# Patient Record
Sex: Male | Born: 1955 | Race: Black or African American | Hispanic: No | Marital: Single | Smoking: Former smoker
Health system: Southern US, Community
[De-identification: ages and names within clinical notes are randomized; demographics above are authoritative.]

## PROBLEM LIST (undated history)

## (undated) DIAGNOSIS — J439 Emphysema, unspecified: Secondary | ICD-10-CM

## (undated) DIAGNOSIS — I10 Essential (primary) hypertension: Secondary | ICD-10-CM

## (undated) DIAGNOSIS — J45909 Unspecified asthma, uncomplicated: Secondary | ICD-10-CM

## (undated) HISTORY — PX: HERNIA REPAIR: SHX51

---

## 2016-08-03 ENCOUNTER — Emergency Department (HOSPITAL_COMMUNITY): Payer: Medicaid Other

## 2016-08-03 ENCOUNTER — Encounter (HOSPITAL_COMMUNITY): Payer: Self-pay | Admitting: *Deleted

## 2016-08-03 ENCOUNTER — Emergency Department (HOSPITAL_COMMUNITY)
Admission: EM | Admit: 2016-08-03 | Discharge: 2016-08-03 | Disposition: A | Discharge status: Home / Self Care | Payer: Medicaid Other | Attending: Emergency Medicine | Admitting: Emergency Medicine

## 2016-08-03 DIAGNOSIS — Z760 Encounter for issue of repeat prescription: Secondary | ICD-10-CM | POA: Insufficient documentation

## 2016-08-03 DIAGNOSIS — Z87891 Personal history of nicotine dependence: Secondary | ICD-10-CM | POA: Diagnosis not present

## 2016-08-03 DIAGNOSIS — J45909 Unspecified asthma, uncomplicated: Secondary | ICD-10-CM | POA: Insufficient documentation

## 2016-08-03 DIAGNOSIS — R0602 Shortness of breath: Secondary | ICD-10-CM | POA: Insufficient documentation

## 2016-08-03 DIAGNOSIS — Z79899 Other long term (current) drug therapy: Secondary | ICD-10-CM | POA: Diagnosis not present

## 2016-08-03 DIAGNOSIS — I10 Essential (primary) hypertension: Secondary | ICD-10-CM | POA: Diagnosis not present

## 2016-08-03 HISTORY — DX: Essential (primary) hypertension: I10

## 2016-08-03 HISTORY — DX: Unspecified asthma, uncomplicated: J45.909

## 2016-08-03 HISTORY — DX: Emphysema, unspecified: J43.9

## 2016-08-03 LAB — CBC WITH DIFFERENTIAL/PLATELET
Basophils Absolute: 0 10*3/uL (ref 0.0–0.1)
Basophils Relative: 0 %
Eosinophils Absolute: 0.1 10*3/uL (ref 0.0–0.7)
Eosinophils Relative: 1 %
HCT: 41.8 % (ref 39.0–52.0)
Hemoglobin: 13.6 g/dL (ref 13.0–17.0)
Lymphocytes Relative: 30 %
Lymphs Abs: 2.2 10*3/uL (ref 0.7–4.0)
MCH: 23.3 pg — ABNORMAL LOW (ref 26.0–34.0)
MCHC: 32.5 g/dL (ref 30.0–36.0)
MCV: 71.7 fL — ABNORMAL LOW (ref 78.0–100.0)
Monocytes Absolute: 0.7 10*3/uL (ref 0.1–1.0)
Monocytes Relative: 9 %
Neutro Abs: 4.4 10*3/uL (ref 1.7–7.7)
Neutrophils Relative %: 60 %
Platelets: 295 10*3/uL (ref 150–400)
RBC: 5.83 MIL/uL — ABNORMAL HIGH (ref 4.22–5.81)
RDW: 15.3 % (ref 11.5–15.5)
WBC: 7.3 10*3/uL (ref 4.0–10.5)

## 2016-08-03 LAB — BASIC METABOLIC PANEL
ANION GAP: 10 (ref 5–15)
BUN: 8 mg/dL (ref 6–20)
CO2: 27 mmol/L (ref 22–32)
Calcium: 9.7 mg/dL (ref 8.9–10.3)
Chloride: 95 mmol/L — ABNORMAL LOW (ref 101–111)
Creatinine, Ser: 0.66 mg/dL (ref 0.61–1.24)
GFR calc Af Amer: >60 mL/min (ref >60)
GFR calc non Af Amer: >60 mL/min (ref >60)
GLUCOSE: 92 mg/dL (ref 65–99)
POTASSIUM: 3.9 mmol/L (ref 3.5–5.1)
Sodium: 132 mmol/L — ABNORMAL LOW (ref 135–145)

## 2016-08-03 MED ORDER — AMLODIPINE BESYLATE 5 MG PO TABS
5.0000 mg | ORAL_TABLET | Freq: Once | ORAL | Status: AC
  Administered 2016-08-03: 5 mg via ORAL
  Filled 2016-08-03: qty 1

## 2016-08-03 MED ORDER — FLUTICASONE-SALMETEROL 250-50 MCG/DOSE IN AEPB: 1.0000 | INHALATION_SPRAY | Freq: Two times a day (BID) | RESPIRATORY_TRACT | 0 refills | Status: DC

## 2016-08-03 MED ORDER — AMLODIPINE BESYLATE 5 MG PO TABS: 5.0000 mg | ORAL_TABLET | Freq: Every day | ORAL | 0 refills | Status: DC

## 2016-08-03 MED ORDER — LISINOPRIL 10 MG PO TABS
10.0000 mg | ORAL_TABLET | Freq: Once | ORAL | Status: AC
  Administered 2016-08-03: 10 mg via ORAL
  Filled 2016-08-03: qty 1

## 2016-08-03 MED ORDER — IPRATROPIUM BROMIDE 0.02 % IN SOLN
0.5000 mg | RESPIRATORY_TRACT | 12 refills | Status: DC | PRN
Start: 2016-08-03 — End: 2016-09-26

## 2016-08-03 MED ORDER — ALBUTEROL SULFATE HFA 108 (90 BASE) MCG/ACT IN AERS
1.0000 | INHALATION_SPRAY | Freq: Once | RESPIRATORY_TRACT | Status: AC
  Administered 2016-08-03: 1 via RESPIRATORY_TRACT
  Filled 2016-08-03: qty 6.7

## 2016-08-03 MED ORDER — IPRATROPIUM-ALBUTEROL 0.5-2.5 (3) MG/3ML IN SOLN
3.0000 mL | Freq: Once | RESPIRATORY_TRACT | Status: AC
  Administered 2016-08-03: 3 mL via RESPIRATORY_TRACT
  Filled 2016-08-03: qty 3

## 2016-08-03 MED ORDER — LISINOPRIL 10 MG PO TABS: 10.0000 mg | ORAL_TABLET | Freq: Every day | ORAL | 0 refills | Status: DC

## 2016-08-03 NOTE — ED Triage Notes (Signed)
Pt was released from prison with a 30 day supply of his medication, which he ran out of a week ago. Pt has been unable to go to social services to    Pt is out of his lisinopril , norvasc , ipratroprium bromide nebulizer solution, albuterol inhaler, airduo inhaler.

## 2016-08-03 NOTE — ED Provider Notes (Signed)
[facility]-EMERGENCY DEPT Provider Note    By signing my name below, I, Matthew Morrison, attest that this documentation has been prepared under the direction and in the presence of [facility], PA-C. Electronically Signed: Earmon Morrison, ED Scribe. 08/03/16. 3:20 PM.    History   Chief Complaint Chief Complaint  Patient presents with  . Medication Refill   The history is provided by the patient and medical records. No language interpreter was used.    Matthew Morrison is a 61 y.o. male with PMHx of asthma, COPD and HTN who presents to the Emergency Department needing a refill on his daily maintenance medications. He reports associated worsening SOB for the past four days and improving productive cough of yellow-white phlegm. He also reports wheezing and sinus pain/pressure due to allergies. He states he was recently released from prison with a 30 day supply of his medications but has not been able to follow up for assistance from social services. He has been taking the medications as directed until he ran out 7 days ago. Exertion increases the SOB. Sometimes hot showers and caffeine help alleviate the symptoms mildly. He denies fever, chills, CP, nausea, vomiting, neck or back pain, numbness, tingling or weakness of any extremity.   Past Medical History:  Diagnosis Date  . Asthma   . Emphysema lung (HCC)   . Hypertension     There are no active problems to display for this patient.   Past Surgical History:  Procedure Laterality Date  . HERNIA REPAIR       Home Medications    Prior to Admission medications   Medication Sig Start Date End Date Taking? Authorizing Provider  albuterol (PROVENTIL) (2.5 MG/3ML) 0.083% nebulizer solution Take 2.5 mg by nebulization every 6 (six) hours as needed for wheezing or shortness of breath.   Yes Historical Provider, MD  ipratropium-albuterol (DUONEB) 0.5-2.5 (3) MG/3ML SOLN Take 3 mLs by nebulization.   Yes Historical Provider, MD  montelukast  (SINGULAIR) 10 MG tablet Take 10 mg by mouth at bedtime.   Yes Historical Provider, MD  amLODipine (NORVASC) 5 MG tablet Take 1 tablet (5 mg total) by mouth daily. 08/03/16 08/17/16  [name] [name], PA-C  Fluticasone-Salmeterol (ADVAIR DISKUS) 250-50 MCG/DOSE AEPB Inhale 1 puff into the lungs 2 (two) times daily. 08/03/16   [name] A [name], PA-C  ipratropium (ATROVENT) 0.02 % nebulizer solution Take 2.5 mLs (0.5 mg total) by nebulization every 4 (four) hours as needed for wheezing or shortness of breath. 08/03/16   [name] A [name], PA-C  lisinopril (PRINIVIL,ZESTRIL) 10 MG tablet Take 1 tablet (10 mg total) by mouth daily. 08/03/16   [name], PA-C    Family History No family history on file.  Social History Social History  Substance Use Topics  . Smoking status: Former Games developer  . Smokeless tobacco: Never Used  . Alcohol use Yes     Allergies   Norflex [orphenadrine]   Review of Systems Review of Systems  Constitutional: Negative for chills and fever.  HENT: Positive for sinus pain and sinus pressure.   Respiratory: Positive for cough, shortness of breath and wheezing.   Cardiovascular: Negative for chest pain.  Gastrointestinal: Negative for nausea and vomiting.  Neurological: Negative for weakness and numbness.     Physical Exam Updated Vital Signs BP (!) 138/103   Pulse (!) 114   Temp 98.1 F (36.7 C)   Resp 16   SpO2 100%   Physical Exam  Constitutional: He is oriented to person,  place, and time. He appears well-developed and well-nourished. No distress.  HENT:  Head: Normocephalic and atraumatic.  Right Ear: Tympanic membrane and external ear normal.  Left Ear: Tympanic membrane and external ear normal.  Nose: Nose normal.  Mouth/Throat: Uvula is midline, oropharynx is clear and moist and mucous membranes are normal. No oropharyngeal exudate. No tonsillar exudate.  No TTP of maxillary or frontal sinuses. Nasal septum midline with pink mucosa and normal drainage. TMs normal  bilaterally. Posterior oropharynx without erythema, exudate, or uvular deviation   Eyes: Conjunctivae and EOM are normal. Pupils are equal, round, and reactive to light. Right eye exhibits no discharge. Left eye exhibits no discharge.  Neck: Normal range of motion. Neck supple. No JVD present. No tracheal deviation present.  Cardiovascular: Regular rhythm and normal heart sounds.  Tachycardia present.   Tachycardic, 2+ radial and DP/PT pulses bl, negative Homan's bl   Pulmonary/Chest: Effort normal. No respiratory distress. He has wheezes. He has no rales. He exhibits no tenderness.  Diffuse expiratory wheezing  Abdominal: Soft. Bowel sounds are normal. He exhibits no distension. There is no tenderness.  Musculoskeletal: Normal range of motion.  Lymphadenopathy:    He has no cervical adenopathy.  Neurological: He is alert and oriented to person, place, and time.  Skin: Skin is warm and dry. Capillary refill takes less than 2 seconds. He is not diaphoretic.  Psychiatric: He has a normal mood and affect. His behavior is normal.  Nursing note and vitals reviewed.    ED Treatments / Results  DIAGNOSTIC STUDIES: Oxygen Saturation is 100% on RA, normal by my interpretation.   COORDINATION OF CARE: 1:51 PM- Encouraged pt to establish care with PCP and social services. Will order CXR, nebulizer treatment and MDI prior to discharge. Will refill medications. Pt verbalizes understanding and agrees to plan.  Medications  lisinopril (PRINIVIL,ZESTRIL) tablet 10 mg (10 mg Oral Given 08/03/16 1417)  amLODipine (NORVASC) tablet 5 mg (5 mg Oral Given 08/03/16 1417)  ipratropium-albuterol (DUONEB) 0.5-2.5 (3) MG/3ML nebulizer solution 3 mL (3 mLs Nebulization Given 08/03/16 1417)  albuterol (PROVENTIL HFA;VENTOLIN HFA) 108 (90 Base) MCG/ACT inhaler 1 puff (1 puff Inhalation Given 08/03/16 1417)    Labs (all labs ordered are listed, but only abnormal results are displayed) Labs Reviewed  CBC WITH  DIFFERENTIAL/PLATELET - Abnormal; Notable for the following:       Result Value   RBC 5.83 (*)    MCV 71.7 (*)    MCH 23.3 (*)    All other components within normal limits  BASIC METABOLIC PANEL - Abnormal; Notable for the following:    Sodium 132 (*)    Chloride 95 (*)    All other components within normal limits    EKG  EKG Interpretation None       Radiology Dg Chest 2 View  Result Date: 08/03/2016 CLINICAL DATA:  Hx of copd, hypertension, arthritis of the spine. Ex-smoker EXAM: CHEST  2 VIEW COMPARISON:  None FINDINGS: The lungs are hyperinflated. There is perihilar peribronchial thickening. A focal irregular opacity is identified in the right upper lobe raising the question of a pulmonary nodule. There are no focal consolidations. No pleural effusions or pulmonary edema. Visualized osseous structures have a normal appearance. IMPRESSION: 1. Hyperinflation and bronchitic changes. 2. Question of right upper lobe nodule warrants further evaluation. CT of the chest is recommended. These results will be called to the ordering clinician or representative by the Radiologist Assistant, and communication documented in the PACS or zVision  Dashboard. Electronically Signed   By: Norva Pavlov M.D.   On: 08/03/2016 14:18    Procedures Procedures (including critical care time)  Medications Ordered in ED Medications  lisinopril (PRINIVIL,ZESTRIL) tablet 10 mg (10 mg Oral Given 08/03/16 1417)  amLODipine (NORVASC) tablet 5 mg (5 mg Oral Given 08/03/16 1417)  ipratropium-albuterol (DUONEB) 0.5-2.5 (3) MG/3ML nebulizer solution 3 mL (3 mLs Nebulization Given 08/03/16 1417)  albuterol (PROVENTIL HFA;VENTOLIN HFA) 108 (90 Base) MCG/ACT inhaler 1 puff (1 puff Inhalation Given 08/03/16 1417)     Initial Impression / Assessment and Plan / ED Course  I have reviewed the triage vital signs and the nursing notes.  Pertinent labs & imaging results that were available during my care of the patient were  reviewed by me and considered in my medical decision making (see chart for details).     Pt is here for refill of Lisinopril, Norvasc, Atrovent nebulizer solution, albuterol MDI and Airduo inhaler. With 4 day history of cough and shortness of breath, will obtain chest x-ray and give DuoNeb as well as obtained baseline labs. Chest x-ray negative for pneumonia, but with hyperinflation and bronchitis changes and a right upper lobe nodule. Patient aware of nodule, and states it has been stable. Emergent CT not required at this time. Patient states he feels much better after getting duoneb, with improvement of wheezing  On re-evaluation. No concerning labs. Medications are not a controlled substance. Will refill medication here. Discussed need to follow up with PCP in 2-3 days, and given information for [facility] and wellness.discussed strict ED return precautions. Pt verbalized understanding of and agreement with plan and is safe for discharge home at this time.   Final Clinical Impressions(s) / ED Diagnoses   Final diagnoses:  SOB (shortness of breath)  Medication refill    New Prescriptions Discharge Medication List as of 08/03/2016  4:22 PM    START taking these medications   Details  Fluticasone-Salmeterol (ADVAIR DISKUS) 250-50 MCG/DOSE AEPB Inhale 1 puff into the lungs 2 (two) times daily., Starting Wed 08/03/2016, Print        I personally performed the services described in this documentation, which was scribed in my presence. The recorded information has been reviewed and is accurate.     [name], PA-C 08/03/16 2030    Doug Sou, MD 08/03/16 2358

## 2016-08-23 ENCOUNTER — Emergency Department (HOSPITAL_COMMUNITY): Payer: Medicaid Other

## 2016-08-23 ENCOUNTER — Encounter (HOSPITAL_COMMUNITY): Payer: Self-pay | Admitting: Emergency Medicine

## 2016-08-23 ENCOUNTER — Emergency Department (HOSPITAL_COMMUNITY)
Admission: EM | Admit: 2016-08-23 | Discharge: 2016-08-24 | Disposition: A | Discharge status: Home / Self Care | Payer: Medicaid Other | Attending: Emergency Medicine | Admitting: Emergency Medicine

## 2016-08-23 DIAGNOSIS — Z87891 Personal history of nicotine dependence: Secondary | ICD-10-CM | POA: Insufficient documentation

## 2016-08-23 DIAGNOSIS — J45909 Unspecified asthma, uncomplicated: Secondary | ICD-10-CM | POA: Diagnosis not present

## 2016-08-23 DIAGNOSIS — J441 Chronic obstructive pulmonary disease with (acute) exacerbation: Secondary | ICD-10-CM | POA: Diagnosis not present

## 2016-08-23 DIAGNOSIS — R0602 Shortness of breath: Secondary | ICD-10-CM | POA: Diagnosis present

## 2016-08-23 DIAGNOSIS — Z79899 Other long term (current) drug therapy: Secondary | ICD-10-CM | POA: Insufficient documentation

## 2016-08-23 DIAGNOSIS — I10 Essential (primary) hypertension: Secondary | ICD-10-CM | POA: Insufficient documentation

## 2016-08-23 LAB — CBC WITH DIFFERENTIAL/PLATELET
Basophils Absolute: 0 10*3/uL (ref 0.0–0.1)
Basophils Relative: 0 %
EOS ABS: 0.1 10*3/uL (ref 0.0–0.7)
EOS PCT: 1 %
HCT: 39.5 % (ref 39.0–52.0)
HEMOGLOBIN: 12.5 g/dL — AB (ref 13.0–17.0)
LYMPHS ABS: 1.6 10*3/uL (ref 0.7–4.0)
Lymphocytes Relative: 16 %
MCH: 22.9 pg — AB (ref 26.0–34.0)
MCHC: 31.6 g/dL (ref 30.0–36.0)
MCV: 72.3 fL — ABNORMAL LOW (ref 78.0–100.0)
MONOS PCT: 2 %
Monocytes Absolute: 0.2 10*3/uL (ref 0.1–1.0)
Neutro Abs: 8 10*3/uL — ABNORMAL HIGH (ref 1.7–7.7)
Neutrophils Relative %: 81 %
Platelets: 253 10*3/uL (ref 150–400)
RBC: 5.46 MIL/uL (ref 4.22–5.81)
RDW: 15.6 % — ABNORMAL HIGH (ref 11.5–15.5)
WBC: 9.9 10*3/uL (ref 4.0–10.5)

## 2016-08-23 LAB — I-STAT TROPONIN, ED: TROPONIN I, POC: 0 ng/mL (ref 0.00–0.08)

## 2016-08-23 MED ORDER — SODIUM CHLORIDE 0.9 % IV BOLUS (SEPSIS)
1000.0000 mL | Freq: Once | INTRAVENOUS | Status: AC
  Administered 2016-08-23: 1000 mL via INTRAVENOUS

## 2016-08-23 MED ORDER — IPRATROPIUM-ALBUTEROL 0.5-2.5 (3) MG/3ML IN SOLN
3.0000 mL | Freq: Once | RESPIRATORY_TRACT | Status: AC
  Administered 2016-08-23: 3 mL via RESPIRATORY_TRACT
  Filled 2016-08-23: qty 3

## 2016-08-23 NOTE — ED Provider Notes (Signed)
[facility]-EMERGENCY DEPT Provider Note   CSN: [id number] Arrival date & time: 08/23/16  2214     History   Chief Complaint Chief Complaint  Patient presents with  . Shortness of Breath    HPI  Matthew Morrison is a 61 y.o. male past medical history of COPD brought in by EMS he reports 3 days of shortness of breath, cough. Patient reports over the last 3 days he has been experiencing shortness of breath. On EMS arrival he had some noticeable wheezing. He was given findings of albuterol and 2 times DuoNeb treatments. He was also given 125 mg a metal en route. He reports some improvement in symptoms after initial treatment by EMS. Patient reports that he was recently released from prison and has not been able to get his medications for his COPD due to financial constraints. He reports that he was seen here 2 weeks ago for some shortness of breath and was prescribed his inhalers. He has been using them and hasn't had some improvement in symptoms. He reports that 3 days ago he ran out of his inhalers he began having worsening shortness of breath. He also reports a nonproductive cough. He reports some chest pain that is secondary to the coughing. He describes pain as a "sore/ache" and states that it is only happening is coughing. He denies any recent illness or fever. He denies any chest pain, abdominal pain, nausea/vomiting, dysuria, hematuria.  The history is provided by the patient.    Past Medical History:  Diagnosis Date  . Asthma   . Emphysema lung (HCC)   . Hypertension     There are no active problems to display for this patient.   Past Surgical History:  Procedure Laterality Date  . HERNIA REPAIR         Home Medications    Prior to Admission medications   Medication Sig Start Date End Date Taking? Authorizing Provider  albuterol (PROVENTIL HFA;VENTOLIN HFA) 108 (90 Base) MCG/ACT inhaler Inhale 2 puffs into the lungs every 4 (four) hours as needed for wheezing or shortness of  breath. 08/24/16   Graciella Freer A, PA-C  albuterol (PROVENTIL) (2.5 MG/3ML) 0.083% nebulizer solution Take 3 mLs (2.5 mg total) by nebulization every 6 (six) hours as needed for wheezing or shortness of breath. 08/24/16   Graciella Freer A, PA-C  amLODipine (NORVASC) 5 MG tablet Take 1 tablet (5 mg total) by mouth daily. 08/03/16 08/17/16  Michela Pitcher A, PA-C  Fluticasone-Salmeterol (ADVAIR DISKUS) 250-50 MCG/DOSE AEPB Inhale 1 puff into the lungs 2 (two) times daily. 08/03/16   Fawze, Mina A, PA-C  ipratropium (ATROVENT) 0.02 % nebulizer solution Take 2.5 mLs (0.5 mg total) by nebulization every 4 (four) hours as needed for wheezing or shortness of breath. 08/03/16   Fawze, Mina A, PA-C  ipratropium-albuterol (DUONEB) 0.5-2.5 (3) MG/3ML SOLN Take 3 mLs by nebulization.    [provider]  lisinopril (PRINIVIL,ZESTRIL) 10 MG tablet Take 1 tablet (10 mg total) by mouth daily. 08/03/16   Fawze, Mina A, PA-C  montelukast (SINGULAIR) 10 MG tablet Take 10 mg by mouth at bedtime.    [provider]  predniSONE (DELTASONE) 10 MG tablet Take 2 tablets (20 mg total) by mouth daily. 08/24/16   Maxwell Caul, PA-C    Family History History reviewed. No pertinent family history.  Social History Social History  Substance Use Topics  . Smoking status: Former Games developer  . Smokeless tobacco: Never Used  . Alcohol use Yes  Allergies   Norflex [orphenadrine]   Review of Systems Review of Systems  Constitutional: Negative for fever.  HENT: Negative for congestion.   Respiratory: Positive for cough and shortness of breath.   Cardiovascular: Positive for chest pain. Negative for leg swelling.  Gastrointestinal: Negative for abdominal pain, nausea and vomiting.  Genitourinary: Negative for dysuria.  Neurological: Negative for headaches.     Physical Exam Updated Vital Signs BP 106/88 (BP Location: Left Arm)   Pulse (!) 110   Temp 97.9 F (36.6 C) (Oral)   Resp 18   SpO2 94%    Physical Exam  Constitutional: He appears well-developed and well-nourished.  Sitting comfortably on examination table.   HENT:  Head: Normocephalic and atraumatic.  Eyes: Conjunctivae and EOM are normal. Right eye exhibits no discharge. Left eye exhibits no discharge. No scleral icterus.  Cardiovascular: Regular rhythm.  Tachycardia present.   Pulses:      Radial pulses are 2+ on the right side, and 2+ on the left side.       Dorsalis pedis pulses are 2+ on the right side, and 2+ on the left side.  No bilateral lower extremity edema.  Pulmonary/Chest: Effort normal. He has wheezes. He has no rhonchi. He has no rales.  Diffuse wheezing throughout all lung fields. No evidence of respiratory distress. Able to speak in full sentences without difficulty.  Musculoskeletal: He exhibits no deformity.  Neurological: He is alert.  Skin: Skin is warm and dry.  Psychiatric: He has a normal mood and affect. His speech is normal and behavior is normal.  Nursing note and vitals reviewed.    ED Treatments / Results  Labs (all labs ordered are listed, but only abnormal results are displayed) Labs Reviewed  CBC WITH DIFFERENTIAL/PLATELET - Abnormal; Notable for the following:       Result Value   Hemoglobin 12.5 (*)    MCV 72.3 (*)    MCH 22.9 (*)    RDW 15.6 (*)    Neutro Abs 8.0 (*)    All other components within normal limits  BASIC METABOLIC PANEL - Abnormal; Notable for the following:    Sodium 134 (*)    Chloride 98 (*)    Glucose, Bld 119 (*)    All other components within normal limits  I-STAT TROPOININ, ED    EKG  EKG Interpretation None       Radiology Dg Chest 2 View  Result Date: 08/23/2016 CLINICAL DATA:  Shortness of breath tonight.  History of COPD. EXAM: CHEST  2 VIEW COMPARISON:  08/03/2016 FINDINGS: Shallow prominent emphysematous changes in the lungs. Scattered fibrosis throughout the lungs but most prominent in the upper lung region. Focal area of scarring  again demonstrated in the right upper lung which could represent early pulmonary nodule. Consider follow-up CT for further evaluation of this lesion. No airspace disease or consolidation. No blunting of costophrenic angles. No pneumothorax. Heart size and pulmonary vascularity are normal. IMPRESSION: Emphysematous changes and scattered fibrosis throughout the lungs. Possible nodule or focal scarring in the right upper lung. No airspace disease or consolidation. Electronically Signed   By: Burman Nieves M.D.   On: 08/23/2016 23:31    Procedures Procedures (including critical care time)  Medications Ordered in ED Medications  albuterol (PROVENTIL HFA;VENTOLIN HFA) 108 (90 Base) MCG/ACT inhaler 1 puff (not administered)  ipratropium-albuterol (DUONEB) 0.5-2.5 (3) MG/3ML nebulizer solution 3 mL (3 mLs Nebulization Given 08/23/16 2322)  sodium chloride 0.9 % bolus 1,000 mL (0 mLs  Intravenous Stopped 08/24/16 0105)     Initial Impression / Assessment and Plan / ED Course  I have reviewed the triage vital signs and the nursing notes.  Pertinent labs & imaging results that were available during my care of the patient were reviewed by me and considered in my medical decision making (see chart for details).     61 year old male with past medical history of COPD who presents with 3 days of worsening shortness of breath and cough. Also some associated chest pain secondary to cough. He recently ran out of his medications. He was seen in the ED a few weeks ago for same symptoms but has not followed up with the referred clinics or filled his prescriptions. Patient is afebrile, non-toxic appearing, sitting comfortably on examination table. Consider COPD exacerbation versus acute infectious etiology versus worsening of pre-existing condition secondary to medication noncompliance. History/physical exam are concerning for COPD exacerbation. Will check basic labs including CBC, BMP. We'll also evaluate troponin  and EKG given complaints of chest pain. Will evaluate chest x-ray for acute infectious etiology. Given that he is still having some residual diffuse wheezing. Will order additional DuoNeb.  Labs and imaging reviewed. CBC with no elevation of white blood cell count. Slight drop in hemoglobin. BMP otherwise within normal limits. Initial troponin negative. Chest x-ray negative for any acute infectious etiology. It does show some changes secondary to emphysema. It also shows a nodule on the right side, which patient had been notified of when he got a chest x-ray when he was seen in the department 2 weeks ago. At this time he was advised to follow-up with his primary care doctor to obtain an outpatient CT for further evaluation.  Reexamination after DuoNeb. Patient still with some mild diffuse wheezing throughout lung fields but has improved since initial ED course. EKG is still pending.  EKG reviewed. Sinus tachycardia rate 102. No ST elevations. No priors for comparison.   Re-evaluation: Patient with no acute signs of respiratory distress. Able to speak in full sentences without difficulty. He has been intermittently napping in the emergency department without any difficulty breathing. On lung reexamination, he still has some mild wheezing but improved from initial evaluation. Patient and I ambulated around the department with no acute SOB or respiratory distress.   Discussed at length with patient. He reports feeling improvement after nebulizer treatments and medications given in the department. He reports that he was able to fill his albuterol inhaler but has not been able to fill his Ventolin nebulizer solution. He has not followed up with the referred clinics that he was given the last time he is seen in the department 2 weeks ago. We discussed that he needs to follow up with the clinics as he may be able to help him in obtaining his prescriptions. Will plan to give him an albuterol inhaler here in the  department. Will plan to send him home with prednisone and albuterol inhaler. Provided patient with a list of clinic resources to use if he does not have a PCP. Instructed to call them today to arrange follow-up in the next 24-48 hours. Return precautions discussed. Patient expresses understanding and agreement to plan.     Final Clinical Impressions(s) / ED Diagnoses   Final diagnoses:  COPD exacerbation (HCC)    New Prescriptions New Prescriptions   ALBUTEROL (PROVENTIL HFA;VENTOLIN HFA) 108 (90 BASE) MCG/ACT INHALER    Inhale 2 puffs into the lungs every 4 (four) hours as needed for wheezing or shortness  of breath.   ALBUTEROL (PROVENTIL) (2.5 MG/3ML) 0.083% NEBULIZER SOLUTION    Take 3 mLs (2.5 mg total) by nebulization every 6 (six) hours as needed for wheezing or shortness of breath.   PREDNISONE (DELTASONE) 10 MG TABLET    Take 2 tablets (20 mg total) by mouth daily.     Maxwell Caul, PA-C 08/24/16 0225    Maxwell Caul, PA-C 08/24/16 0228    Tegeler, Canary Brim, MD 08/24/16 1114

## 2016-08-23 NOTE — ED Triage Notes (Signed)
Pt BIB EMS from home. Pt reports having been without any of his medications for the past 3 days. Incr'd SOB over that time, particularly with basic activity. EMS reported wheezes throughout upon their arrival on scene. Pt given 5mg  albuterol and 2 duoneb treatments, as well as 125mg  solu-medrol. Denies CP, weakness, dizziness.

## 2016-08-24 LAB — BASIC METABOLIC PANEL
Anion gap: 11 (ref 5–15)
BUN: 15 mg/dL (ref 6–20)
CALCIUM: 9.1 mg/dL (ref 8.9–10.3)
CO2: 25 mmol/L (ref 22–32)
CREATININE: 0.78 mg/dL (ref 0.61–1.24)
Chloride: 98 mmol/L — ABNORMAL LOW (ref 101–111)
GFR calc non Af Amer: >60 mL/min (ref >60)
Glucose, Bld: 119 mg/dL — ABNORMAL HIGH (ref 65–99)
Potassium: 3.5 mmol/L (ref 3.5–5.1)
Sodium: 134 mmol/L — ABNORMAL LOW (ref 135–145)

## 2016-08-24 MED ORDER — ALBUTEROL SULFATE (2.5 MG/3ML) 0.083% IN NEBU: 2.5000 mg | INHALATION_SOLUTION | Freq: Four times a day (QID) | RESPIRATORY_TRACT | 12 refills | Status: DC | PRN

## 2016-08-24 MED ORDER — ALBUTEROL SULFATE HFA 108 (90 BASE) MCG/ACT IN AERS
1.0000 | INHALATION_SPRAY | Freq: Once | RESPIRATORY_TRACT | Status: AC
  Administered 2016-08-24: 1 via RESPIRATORY_TRACT
  Filled 2016-08-24: qty 6.7

## 2016-08-24 MED ORDER — ALBUTEROL SULFATE HFA 108 (90 BASE) MCG/ACT IN AERS: 2.0000 | INHALATION_SPRAY | RESPIRATORY_TRACT | 0 refills | Status: DC | PRN

## 2016-08-24 MED ORDER — PREDNISONE 10 MG PO TABS: 20.0000 mg | ORAL_TABLET | Freq: Every day | ORAL | 0 refills | Status: DC

## 2016-08-24 NOTE — ED Notes (Addendum)
ECG done by NT, but not crossing over in system. Repeated ECG per PA request. Paper copy given to Tegeler, MD.

## 2016-08-24 NOTE — ED Notes (Signed)
Pt departed in NAD, refused use of wheelchair.  

## 2016-08-24 NOTE — Discharge Instructions (Addendum)
Taking the prednisone as directed.  Use albuterol inhaler as directed.  Fill the prescriptions you have for the Ventolin nebulizer solution.  Follow-up with the referred clinics provided to you in the referral papers. Call them and arrange for an appointment in the next 24-48 hours.  Return to the emergency department for any difficulty breathing, worsening chest pain, fever, worsening or concerning symptoms.  If you do not have a primary care doctor you see regularly, please you the list below. Please call them to arrange for follow-up.    No Primary Care Doctor Call [facility]  [phone] Other agencies that provide inexpensive medical care    [facility]  [phone]    [facility]  [phone]    [facility]  [phone]    [facility]  [phone]    [facility]  [phone]    [facility]  [phone]

## 2016-09-03 ENCOUNTER — Emergency Department (HOSPITAL_COMMUNITY)
Admission: EM | Admit: 2016-09-03 | Discharge: 2016-09-03 | Disposition: A | Discharge status: Home / Self Care | Payer: Medicaid Other | Attending: Emergency Medicine | Admitting: Emergency Medicine

## 2016-09-03 ENCOUNTER — Emergency Department (HOSPITAL_COMMUNITY): Payer: Medicaid Other

## 2016-09-03 ENCOUNTER — Encounter (HOSPITAL_COMMUNITY): Payer: Self-pay | Admitting: Emergency Medicine

## 2016-09-03 DIAGNOSIS — J45909 Unspecified asthma, uncomplicated: Secondary | ICD-10-CM | POA: Diagnosis not present

## 2016-09-03 DIAGNOSIS — I10 Essential (primary) hypertension: Secondary | ICD-10-CM | POA: Insufficient documentation

## 2016-09-03 DIAGNOSIS — R0602 Shortness of breath: Secondary | ICD-10-CM | POA: Diagnosis present

## 2016-09-03 DIAGNOSIS — Z79899 Other long term (current) drug therapy: Secondary | ICD-10-CM | POA: Insufficient documentation

## 2016-09-03 DIAGNOSIS — J441 Chronic obstructive pulmonary disease with (acute) exacerbation: Secondary | ICD-10-CM | POA: Diagnosis not present

## 2016-09-03 DIAGNOSIS — Z87891 Personal history of nicotine dependence: Secondary | ICD-10-CM | POA: Insufficient documentation

## 2016-09-03 LAB — CBC
HEMATOCRIT: 42.1 % (ref 39.0–52.0)
HEMOGLOBIN: 14.1 g/dL (ref 13.0–17.0)
MCH: 23.7 pg — ABNORMAL LOW (ref 26.0–34.0)
MCHC: 33.5 g/dL (ref 30.0–36.0)
MCV: 70.6 fL — ABNORMAL LOW (ref 78.0–100.0)
Platelets: 289 10*3/uL (ref 150–400)
RBC: 5.96 MIL/uL — ABNORMAL HIGH (ref 4.22–5.81)
RDW: 16.2 % — AB (ref 11.5–15.5)
WBC: 6.6 10*3/uL (ref 4.0–10.5)

## 2016-09-03 MED ORDER — PREDNISONE 10 MG PO TABS: ORAL_TABLET | ORAL | 0 refills | Status: DC

## 2016-09-03 MED ORDER — IPRATROPIUM-ALBUTEROL 0.5-2.5 (3) MG/3ML IN SOLN
3.0000 mL | Freq: Once | RESPIRATORY_TRACT | Status: AC
Start: 2016-09-03 — End: 2016-09-03
  Administered 2016-09-03: 3 mL via RESPIRATORY_TRACT
  Filled 2016-09-03: qty 3

## 2016-09-03 MED ORDER — AEROCHAMBER PLUS FLO-VU MEDIUM MISC
1.0000 | Freq: Once | Status: AC
  Administered 2016-09-03: 1
  Filled 2016-09-03: qty 1

## 2016-09-03 MED ORDER — ALBUTEROL SULFATE (2.5 MG/3ML) 0.083% IN NEBU
5.0000 mg | INHALATION_SOLUTION | Freq: Once | RESPIRATORY_TRACT | Status: AC
  Administered 2016-09-03: 5 mg via RESPIRATORY_TRACT
  Filled 2016-09-03: qty 6

## 2016-09-03 MED ORDER — ALBUTEROL SULFATE HFA 108 (90 BASE) MCG/ACT IN AERS
2.0000 | INHALATION_SPRAY | RESPIRATORY_TRACT | Status: DC | PRN
  Administered 2016-09-03: 2 via RESPIRATORY_TRACT
  Filled 2016-09-03: qty 6.7

## 2016-09-03 MED ORDER — ALBUTEROL (5 MG/ML) CONTINUOUS INHALATION SOLN
10.0000 mg/h | INHALATION_SOLUTION | Freq: Once | RESPIRATORY_TRACT | Status: AC
  Administered 2016-09-03: 10 mg/h via RESPIRATORY_TRACT
  Filled 2016-09-03: qty 20

## 2016-09-03 MED ORDER — ALBUTEROL SULFATE (2.5 MG/3ML) 0.083% IN NEBU: 2.5000 mg | INHALATION_SOLUTION | Freq: Four times a day (QID) | RESPIRATORY_TRACT | 12 refills | Status: DC | PRN

## 2016-09-03 MED ORDER — PREDNISONE 20 MG PO TABS
40.0000 mg | ORAL_TABLET | Freq: Every day | ORAL | 0 refills | Status: DC
Start: 2016-09-03 — End: 2016-09-26

## 2016-09-03 MED ORDER — ALBUTEROL SULFATE (2.5 MG/3ML) 0.083% IN NEBU: 2.5000 mg | INHALATION_SOLUTION | Freq: Four times a day (QID) | RESPIRATORY_TRACT | 1 refills | Status: DC | PRN

## 2016-09-03 NOTE — ED Provider Notes (Signed)
[facility]-EMERGENCY DEPT Provider Note   CSN: [id number] Arrival date & time: 09/03/16  0200  By signing my name below, I, Bing Neighbors., attest that this documentation has been prepared under the direction and in the presence of Mancel Bale, MD. Electronically signed: Bing Neighbors., ED Scribe. 09/03/16. 10:15 AM.   History   Chief Complaint Chief Complaint  Patient presents with  . Shortness of Breath    HPI Ojani Berenson is a 61 y.o. male with hx of COPD who presents to the Emergency [facility] complaining of SOB with onset x1 hour. Pt states that he had maintenance come to his home and fix his window which was done with an industrial lubricant. He reportedly inhaled the fumes and became SOB. Pt reports non-productive cough, chills, chest tightness. Per triage note, EMS placed 20 in L hand, 125 solumedrol administered, breathing treatment given, albuterol and non-rebreather administered with some relief. He denies fever, vomiting. Of note, pt states that his breathing has improved but that he is still SOB.   The history is provided by the patient. No language interpreter was used.    Past Medical History:  Diagnosis Date  . Asthma   . Emphysema lung (HCC)   . Hypertension     There are no active problems to display for this patient.   Past Surgical History:  Procedure Laterality Date  . HERNIA REPAIR         Home Medications    Prior to Admission medications   Medication Sig Start Date End Date Taking? Authorizing Provider  amLODipine (NORVASC) 5 MG tablet Take 1 tablet (5 mg total) by mouth daily. 08/03/16 09/03/16 Yes Fawze, Mina A, PA-C  Fluticasone-Salmeterol (ADVAIR DISKUS) 250-50 MCG/DOSE AEPB Inhale 1 puff into the lungs 2 (two) times daily. 08/03/16  Yes Fawze, Mina A, PA-C  ipratropium (ATROVENT) 0.02 % nebulizer solution Take 2.5 mLs (0.5 mg total) by nebulization every 4 (four) hours as needed for wheezing or shortness of breath. 08/03/16   Yes Fawze, Mina A, PA-C  lisinopril (PRINIVIL,ZESTRIL) 10 MG tablet Take 1 tablet (10 mg total) by mouth daily. 08/03/16  Yes Fawze, Mina A, PA-C  albuterol (PROVENTIL) (2.5 MG/3ML) 0.083% nebulizer solution Take 3 mLs (2.5 mg total) by nebulization every 6 (six) hours as needed for wheezing or shortness of breath. 09/03/16   Mancel Bale, MD  albuterol (PROVENTIL) (2.5 MG/3ML) 0.083% nebulizer solution Take 3 mLs (2.5 mg total) by nebulization every 6 (six) hours as needed for wheezing or shortness of breath. 09/03/16   Mancel Bale, MD  predniSONE (DELTASONE) 10 MG tablet Take q day 6,5,4,3,2,1 09/03/16   Mancel Bale, MD    Family History No family history on file.  Social History Social History  Substance Use Topics  . Smoking status: Former Games developer  . Smokeless tobacco: Never Used  . Alcohol use Yes     Allergies   Norflex [orphenadrine]   Review of Systems Review of Systems  Constitutional: Positive for chills. Negative for fever.  Respiratory: Positive for cough, chest tightness, shortness of breath and wheezing.   Cardiovascular: Negative for chest pain.  Gastrointestinal: Negative for nausea and vomiting.  All other systems reviewed and are negative.    Physical Exam Updated Vital Signs BP 119/86 (BP Location: Left Arm)   Pulse (!) 111   Temp 97.8 F (36.6 C) (Oral)   Resp 17   SpO2 92%   Physical Exam  Constitutional: He is oriented to person, place, and  time. He appears well-developed and well-nourished.  HENT:  Head: Normocephalic and atraumatic.  Right Ear: External ear normal.  Left Ear: External ear normal.  Eyes: Conjunctivae and EOM are normal. Pupils are equal, round, and reactive to light.  Neck: Normal range of motion and phonation normal. Neck supple.  Cardiovascular: Normal rate, regular rhythm and normal heart sounds.   Pulmonary/Chest: Effort normal. He has decreased breath sounds. He has wheezes. He exhibits no bony tenderness.  Decreased air  movement bilaterally with scattered wheezes.  Abdominal: Soft. There is no tenderness.  Musculoskeletal: Normal range of motion.  Neurological: He is alert and oriented to person, place, and time. No cranial nerve deficit or sensory deficit. He exhibits normal muscle tone. Coordination normal.  Skin: Skin is warm, dry and intact.  Psychiatric: He has a normal mood and affect. His behavior is normal. Judgment and thought content normal.  Nursing note and vitals reviewed.    ED Treatments / Results   DIAGNOSTIC STUDIES: Oxygen Saturation is 99% on RA, normal by my interpretation.   COORDINATION OF CARE: 10:15 AM-Discussed next steps with pt. Pt verbalized understanding and is agreeable with the plan.    Labs (all labs ordered are listed, but only abnormal results are displayed) Labs Reviewed  CBC - Abnormal; Notable for the following:       Result Value   RBC 5.96 (*)    MCV 70.6 (*)    MCH 23.7 (*)    RDW 16.2 (*)    All other components within normal limits    EKG  EKG Interpretation  Date/Time:  Saturday September 03 2016 02:56:07 EDT Ventricular Rate:  92 PR Interval:    QRS Duration: 86 QT Interval:  380 QTC Calculation: 471 R Axis:   74 Text Interpretation:  Sinus rhythm Right atrial enlargement Anterior infarct, old since last tracing no significant change Confirmed by Mancel BaleWentz, Trena Dunavan [phone]([id number]) on 09/03/2016 3:15:58 AM       Radiology Dg Chest 2 View  Result Date: 09/03/2016 CLINICAL DATA:  Acute onset of shortness of breath. Initial encounter. EXAM: CHEST  2 VIEW COMPARISON:  Chest radiograph performed 08/23/2016 FINDINGS: The lungs are hyperexpanded, with flattening of the hemidiaphragms compatible with COPD. Minimal scarring is again noted at the right upper lung zone. There is no evidence of pleural effusion or pneumothorax. The heart is normal in size; the mediastinal contour is within normal limits. No acute osseous abnormalities are seen. IMPRESSION: Findings of  COPD.  Lungs otherwise clear. Electronically Signed   By: Roanna RaiderJeffery  Chang M.D.   On: 09/03/2016 02:30    Procedures Procedures (including critical care time)  Medications Ordered in ED Medications  albuterol (PROVENTIL HFA;VENTOLIN HFA) 108 (90 Base) MCG/ACT inhaler 2 puff (not administered)  AEROCHAMBER PLUS FLO-VU MEDIUM MISC 1 each (not administered)  albuterol (PROVENTIL) (2.5 MG/3ML) 0.083% nebulizer solution 5 mg (5 mg Nebulization Given 09/03/16 0312)  ipratropium-albuterol (DUONEB) 0.5-2.5 (3) MG/3ML nebulizer solution 3 mL (3 mLs Nebulization Given 09/03/16 0746)  albuterol (PROVENTIL,VENTOLIN) solution continuous neb (10 mg/hr Nebulization Given 09/03/16 0827)     Initial Impression / Assessment and Plan / ED Course  I have reviewed the triage vital signs and the nursing notes.  Pertinent labs & imaging results that were available during my care of the patient were reviewed by me and considered in my medical decision making (see chart for details).  Clinical Course as of Sep 03 1013  Sat Sep 03, 2016  0725 At this time oxygen  saturation 94% on nasal cannula oxygen at 2 L.  Oxygen removed now, as a trial to see if he has a oxygen requirement.  Also give second nebulizer treatment.  [EW]    Clinical Course User Index [EW] Mancel Bale, MD     Patient Vitals for the past 24 hrs:  BP Temp Temp src Pulse Resp SpO2  09/03/16 1009 119/86 97.8 F (36.6 C) Oral (!) 111 17 92 %  09/03/16 0902 (!) 127/91 - - 90 16 96 %  09/03/16 0827 - - - - - 92 %  09/03/16 0744 (!) 130/100 - - 90 16 92 %  09/03/16 0700 115/88 - - 90 (!) 23 95 %  09/03/16 0630 109/88 - - 82 12 95 %  09/03/16 0600 118/89 - - 84 12 95 %  09/03/16 0540 117/87 - - 87 14 (!) 87 %  09/03/16 0312 - - - - - 99 %  09/03/16 0217 - - - - - 100 %  09/03/16 0208 - - - - - 100 %  09/03/16 0204 (!) 160/109 97.7 F (36.5 C) Oral 85 20 99 %    10:15 AM Reevaluation with update and discussion. After initial assessment and  treatment, an updated evaluation reveals following ambulation, the patient was somewhat winded however I observed his heart rate improved from 115-101, with rest.  Likewise respiratory rate improved from 20>16, with rest.  Oxygenation on room air is 91%.  Findings discussed with patient and all questions answered.  He states that he is new in town but knows of clinics he can go to for medical care, he was recently given that information from the emergency department. Hilberto Burzynski L    Final Clinical Impressions(s) / ED Diagnoses   Final diagnoses:  COPD exacerbation (HCC)    COPD exacerbation, recurrent.  Patient improved with treatment and stable for discharge.  He knows to return for worsening condition.  Doubt pneumonia, metabolic instability or impending vascular collapse.  Nursing Notes Reviewed/ Care Coordinated Applicable Imaging Reviewed Interpretation of Laboratory Data incorporated into ED treatment  The patient appears reasonably screened and/or stabilized for discharge and I doubt any other medical condition or other [facility] requiring further screening, evaluation, or treatment in the ED at this time prior to discharge.  Plan: Home Medications-continue usual; Home Treatments-rest, fluids; return here if the recommended treatment, does not improve the symptoms; Recommended follow up-PCP, for checkup as soon as possible.  Return here if needed.     New Prescriptions New Prescriptions   ALBUTEROL (PROVENTIL) (2.5 MG/3ML) 0.083% NEBULIZER SOLUTION    Take 3 mLs (2.5 mg total) by nebulization every 6 (six) hours as needed for wheezing or shortness of breath.   PREDNISONE (DELTASONE) 10 MG TABLET    Take q day 6,5,4,3,2,1   I personally performed the services described in this documentation, which was scribed in my presence. The recorded information has been reviewed and is accurate.    Mancel Bale, MD 09/03/16 1017

## 2016-09-03 NOTE — ED Triage Notes (Signed)
Patient presents by EMS with progressive shortness of breath-EMS administered duoneb/then albuterol 5 mg-total 10 mg albuterol, administered Solumedrol 125 mg IV. EMS states patient told them SOB since 1230 yesterday after managment of is apartment sprayed some stuff around his door and window. Pt gave himself 1 atrovent prior to calling EMS.

## 2016-09-03 NOTE — ED Notes (Signed)
Bed: [id number] Expected date:  Expected time:  Means of arrival:  Comments: EMS 61 yo male difficulty breathing/COPD-Solumedrol and neb

## 2016-09-03 NOTE — ED Triage Notes (Signed)
Pt comes to ed via ems, c/o SOB after inhaling fumes.  Window Surveyor, miningindustrial lubricate. Pt became short of breathe tripoding. Ems place 20 in left hand, 125 solumedrol administered, breathing tx given, albuterol and non breather tx given.  Pt has allergies norflex, medical hx of HTN, high cholesterol and asthma/ COPD.

## 2016-09-03 NOTE — ED Notes (Signed)
Pt refuses wheelchair; request to walk and call cab.

## 2016-09-03 NOTE — ED Notes (Signed)
RT placed pt on 2 lpm [place] with continuous nebulizer related to oxygen saturation 88% on RA.

## 2016-09-03 NOTE — Discharge Instructions (Signed)
Continue to use your DuoNeb (albuterol, plus ipratropium bromide) nebulizer 4 times a day.  Use the albuterol nebulizer solution, in between times, if needed to help your breathing.  We are also giving you an inhaler to use if needed, when not near your nebulizer.  Start the prednisone prescription today.  Get plenty of rest, and drink a lot of fluids.  Return here, if needed, for problems.

## 2016-09-03 NOTE — ED Notes (Signed)
Matthew Morrison spoke with pt regarding plan of care; decision made to discharge with medication.

## 2016-09-03 NOTE — ED Notes (Signed)
Pt is alert and oriented x 4 and verbally responsive Pt denies any pain reports tightness in his chest d/t SOB, pt states that maintenance, sprayed a solution in his home to open a jammed window and trigger the SOB. Pt reports Hx of COPD/Emphesema and is not currently on O2 . Pt is breathing Non labored @ 91-94% on RA. Pt has occasional PVC noted on Cardiac monitor.

## 2016-09-03 NOTE — ED Notes (Addendum)
Delay in administration of nebulizer treatment related to pt request to use urinal. Just prior to attempt to administer nebulizer treatment Wentz discontinued pt from oxygen.

## 2016-09-03 NOTE — ED Notes (Addendum)
Post continuous nebulizer completion, pt [facility] on [facility]. Oxygen saturation on RA 89-90%; Wentz request pt ambulated to evaluate if oxygen saturation drops; oxygen saturation with ambulation stands at 89-90% but pt appears winded. Respiratory sounds diminished with assessment. Wentz at bedside and aware of all.

## 2016-09-19 ENCOUNTER — Inpatient Hospital Stay (HOSPITAL_COMMUNITY): Payer: Medicaid Other

## 2016-09-19 ENCOUNTER — Inpatient Hospital Stay (HOSPITAL_COMMUNITY)
Admission: EM | Admit: 2016-09-19 | Discharge: 2016-09-26 | DRG: 190 | Disposition: A | Discharge status: Home Health | Payer: Medicaid Other | Attending: Internal Medicine | Admitting: Internal Medicine

## 2016-09-19 ENCOUNTER — Emergency Department (HOSPITAL_COMMUNITY): Payer: Medicaid Other

## 2016-09-19 ENCOUNTER — Encounter (HOSPITAL_COMMUNITY): Payer: Self-pay | Admitting: Emergency Medicine

## 2016-09-19 DIAGNOSIS — R0602 Shortness of breath: Secondary | ICD-10-CM | POA: Diagnosis present

## 2016-09-19 DIAGNOSIS — I700 Atherosclerosis of aorta: Secondary | ICD-10-CM | POA: Diagnosis present

## 2016-09-19 DIAGNOSIS — J9601 Acute respiratory failure with hypoxia: Secondary | ICD-10-CM | POA: Diagnosis present

## 2016-09-19 DIAGNOSIS — F17200 Nicotine dependence, unspecified, uncomplicated: Secondary | ICD-10-CM

## 2016-09-19 DIAGNOSIS — Z681 Body mass index (BMI) 19 or less, adult: Secondary | ICD-10-CM | POA: Diagnosis not present

## 2016-09-19 DIAGNOSIS — R05 Cough: Secondary | ICD-10-CM | POA: Diagnosis not present

## 2016-09-19 DIAGNOSIS — Z79899 Other long term (current) drug therapy: Secondary | ICD-10-CM

## 2016-09-19 DIAGNOSIS — Z720 Tobacco use: Secondary | ICD-10-CM

## 2016-09-19 DIAGNOSIS — K5900 Constipation, unspecified: Secondary | ICD-10-CM | POA: Diagnosis present

## 2016-09-19 DIAGNOSIS — E7800 Pure hypercholesterolemia, unspecified: Secondary | ICD-10-CM | POA: Diagnosis present

## 2016-09-19 DIAGNOSIS — J9621 Acute and chronic respiratory failure with hypoxia: Secondary | ICD-10-CM | POA: Diagnosis present

## 2016-09-19 DIAGNOSIS — J439 Emphysema, unspecified: Secondary | ICD-10-CM | POA: Diagnosis not present

## 2016-09-19 DIAGNOSIS — E43 Unspecified severe protein-calorie malnutrition: Secondary | ICD-10-CM | POA: Diagnosis present

## 2016-09-19 DIAGNOSIS — T464X5A Adverse effect of angiotensin-converting-enzyme inhibitors, initial encounter: Secondary | ICD-10-CM | POA: Diagnosis not present

## 2016-09-19 DIAGNOSIS — J984 Other disorders of lung: Secondary | ICD-10-CM

## 2016-09-19 DIAGNOSIS — R9389 Abnormal findings on diagnostic imaging of other specified body structures: Secondary | ICD-10-CM

## 2016-09-19 DIAGNOSIS — R3915 Urgency of urination: Secondary | ICD-10-CM | POA: Diagnosis present

## 2016-09-19 DIAGNOSIS — R938 Abnormal findings on diagnostic imaging of other specified body structures: Secondary | ICD-10-CM | POA: Diagnosis not present

## 2016-09-19 DIAGNOSIS — F17210 Nicotine dependence, cigarettes, uncomplicated: Secondary | ICD-10-CM | POA: Diagnosis present

## 2016-09-19 DIAGNOSIS — I10 Essential (primary) hypertension: Secondary | ICD-10-CM | POA: Diagnosis present

## 2016-09-19 DIAGNOSIS — J441 Chronic obstructive pulmonary disease with (acute) exacerbation: Secondary | ICD-10-CM | POA: Diagnosis present

## 2016-09-19 DIAGNOSIS — Z515 Encounter for palliative care: Secondary | ICD-10-CM | POA: Diagnosis present

## 2016-09-19 DIAGNOSIS — F419 Anxiety disorder, unspecified: Secondary | ICD-10-CM | POA: Diagnosis present

## 2016-09-19 DIAGNOSIS — K5903 Drug induced constipation: Secondary | ICD-10-CM | POA: Diagnosis not present

## 2016-09-19 DIAGNOSIS — J449 Chronic obstructive pulmonary disease, unspecified: Secondary | ICD-10-CM | POA: Diagnosis present

## 2016-09-19 LAB — CBC WITH DIFFERENTIAL/PLATELET
Basophils Absolute: 0 10*3/uL (ref 0.0–0.1)
Basophils Relative: 0 %
Eosinophils Absolute: 0 10*3/uL (ref 0.0–0.7)
Eosinophils Relative: 0 %
HEMATOCRIT: 39.4 % (ref 39.0–52.0)
HEMOGLOBIN: 13.2 g/dL (ref 13.0–17.0)
LYMPHS ABS: 0.6 10*3/uL — AB (ref 0.7–4.0)
Lymphocytes Relative: 9 %
MCH: 23.6 pg — ABNORMAL LOW (ref 26.0–34.0)
MCHC: 33.5 g/dL (ref 30.0–36.0)
MCV: 70.4 fL — ABNORMAL LOW (ref 78.0–100.0)
MONO ABS: 0.1 10*3/uL (ref 0.1–1.0)
MONOS PCT: 1 %
NEUTROS ABS: 5.9 10*3/uL (ref 1.7–7.7)
Neutrophils Relative %: 90 %
Platelets: 247 10*3/uL (ref 150–400)
RBC: 5.6 MIL/uL (ref 4.22–5.81)
RDW: 15.5 % (ref 11.5–15.5)
WBC: 6.7 10*3/uL (ref 4.0–10.5)

## 2016-09-19 LAB — I-STAT CHEM 8, ED
BUN: 8 mg/dL (ref 6–20)
CALCIUM ION: 1.11 mmol/L — AB (ref 1.15–1.40)
CREATININE: 0.5 mg/dL — AB (ref 0.61–1.24)
Chloride: 97 mmol/L — ABNORMAL LOW (ref 101–111)
GLUCOSE: 137 mg/dL — AB (ref 65–99)
HCT: 45 % (ref 39.0–52.0)
Hemoglobin: 15.3 g/dL (ref 13.0–17.0)
Potassium: 4.1 mmol/L (ref 3.5–5.1)
Sodium: 135 mmol/L (ref 135–145)
TCO2: 29 mmol/L (ref 0–100)

## 2016-09-19 MED ORDER — ENOXAPARIN SODIUM 40 MG/0.4ML ~~LOC~~ SOLN
40.0000 mg | SUBCUTANEOUS | Status: DC
  Administered 2016-09-19 – 2016-09-25 (×7): 40 mg via SUBCUTANEOUS
  Filled 2016-09-19 (×7): qty 0.4

## 2016-09-19 MED ORDER — AMLODIPINE BESYLATE 5 MG PO TABS
5.0000 mg | ORAL_TABLET | Freq: Every day | ORAL | Status: DC
  Administered 2016-09-19 – 2016-09-20 (×2): 5 mg via ORAL
  Filled 2016-09-19 (×3): qty 1

## 2016-09-19 MED ORDER — METHYLPREDNISOLONE SODIUM SUCC 125 MG IJ SOLR
80.0000 mg | Freq: Four times a day (QID) | INTRAMUSCULAR | Status: DC
  Administered 2016-09-19 – 2016-09-20 (×4): 80 mg via INTRAVENOUS
  Filled 2016-09-19 (×4): qty 2

## 2016-09-19 MED ORDER — ACETAMINOPHEN 325 MG PO TABS: 650.0000 mg | ORAL_TABLET | Freq: Four times a day (QID) | ORAL | Status: DC | PRN

## 2016-09-19 MED ORDER — ONDANSETRON HCL 4 MG PO TABS: 4.0000 mg | ORAL_TABLET | Freq: Four times a day (QID) | ORAL | Status: DC | PRN

## 2016-09-19 MED ORDER — ALBUTEROL SULFATE (2.5 MG/3ML) 0.083% IN NEBU: 2.5000 mg | INHALATION_SOLUTION | RESPIRATORY_TRACT | Status: DC | PRN

## 2016-09-19 MED ORDER — BUDESONIDE 0.25 MG/2ML IN SUSP
0.2500 mg | Freq: Two times a day (BID) | RESPIRATORY_TRACT | Status: DC
  Administered 2016-09-19 – 2016-09-23 (×9): 0.25 mg via RESPIRATORY_TRACT
  Filled 2016-09-19 (×9): qty 2

## 2016-09-19 MED ORDER — OXYCODONE HCL 5 MG PO TABS: 5.0000 mg | ORAL_TABLET | ORAL | Status: DC | PRN

## 2016-09-19 MED ORDER — IPRATROPIUM-ALBUTEROL 0.5-2.5 (3) MG/3ML IN SOLN
3.0000 mL | Freq: Four times a day (QID) | RESPIRATORY_TRACT | Status: DC
  Administered 2016-09-20 (×2): 3 mL via RESPIRATORY_TRACT
  Filled 2016-09-19 (×2): qty 3

## 2016-09-19 MED ORDER — ALBUTEROL SULFATE (2.5 MG/3ML) 0.083% IN NEBU
5.0000 mg | INHALATION_SOLUTION | Freq: Once | RESPIRATORY_TRACT | Status: AC
  Administered 2016-09-19: 5 mg via RESPIRATORY_TRACT
  Filled 2016-09-19: qty 6

## 2016-09-19 MED ORDER — SODIUM CHLORIDE 0.9 % IV SOLN
INTRAVENOUS | Status: DC
  Administered 2016-09-19 – 2016-09-20 (×2): via INTRAVENOUS

## 2016-09-19 MED ORDER — LEVOFLOXACIN IN D5W 500 MG/100ML IV SOLN
500.0000 mg | INTRAVENOUS | Status: DC
  Administered 2016-09-19 – 2016-09-20 (×2): 500 mg via INTRAVENOUS
  Filled 2016-09-19: qty 100

## 2016-09-19 MED ORDER — ACETAMINOPHEN 650 MG RE SUPP: 650.0000 mg | Freq: Four times a day (QID) | RECTAL | Status: DC | PRN

## 2016-09-19 MED ORDER — ALBUTEROL (5 MG/ML) CONTINUOUS INHALATION SOLN
10.0000 mg/h | INHALATION_SOLUTION | Freq: Once | RESPIRATORY_TRACT | Status: AC
  Administered 2016-09-19: 10 mg/h via RESPIRATORY_TRACT
  Filled 2016-09-19: qty 20

## 2016-09-19 MED ORDER — ONDANSETRON HCL 4 MG/2ML IJ SOLN: 4.0000 mg | Freq: Four times a day (QID) | INTRAMUSCULAR | Status: DC | PRN

## 2016-09-19 MED ORDER — IPRATROPIUM-ALBUTEROL 0.5-2.5 (3) MG/3ML IN SOLN
3.0000 mL | RESPIRATORY_TRACT | Status: DC
  Administered 2016-09-19 (×3): 3 mL via RESPIRATORY_TRACT
  Filled 2016-09-19 (×3): qty 3

## 2016-09-19 MED ORDER — GUAIFENESIN ER 600 MG PO TB12
600.0000 mg | ORAL_TABLET | Freq: Two times a day (BID) | ORAL | Status: DC
  Administered 2016-09-19 – 2016-09-23 (×10): 600 mg via ORAL
  Filled 2016-09-19 (×10): qty 1

## 2016-09-19 MED ORDER — LISINOPRIL 10 MG PO TABS
10.0000 mg | ORAL_TABLET | Freq: Every day | ORAL | Status: DC
  Administered 2016-09-19 – 2016-09-23 (×5): 10 mg via ORAL
  Filled 2016-09-19 (×5): qty 1

## 2016-09-19 MED ORDER — SODIUM CHLORIDE 0.9% FLUSH
3.0000 mL | Freq: Two times a day (BID) | INTRAVENOUS | Status: DC
  Administered 2016-09-19 – 2016-09-25 (×12): 3 mL via INTRAVENOUS

## 2016-09-19 MED ORDER — NICOTINE 14 MG/24HR TD PT24
14.0000 mg | MEDICATED_PATCH | Freq: Every day | TRANSDERMAL | Status: DC
  Administered 2016-09-19 – 2016-09-26 (×8): 14 mg via TRANSDERMAL
  Filled 2016-09-19 (×8): qty 1

## 2016-09-19 MED ORDER — DEXAMETHASONE SODIUM PHOSPHATE 10 MG/ML IJ SOLN
10.0000 mg | Freq: Once | INTRAMUSCULAR | Status: AC
  Administered 2016-09-19: 10 mg via INTRAVENOUS
  Filled 2016-09-19: qty 1

## 2016-09-19 NOTE — H&P (Signed)
[facility] History and Physical  Matthew Morrison UJW:[id number] DOB: 01/06/1956 DOA: 09/19/2016   PCP: Patient, No Pcp Per  Specialists: None  Chief Complaint: Shortness of breath  HPI: Matthew SpeedDana Wilborn is a 61 y.o. male with a past medical history of emphysema, hypertension, arthritis, hypercholesterolemia, who moved to [place] from [place]. He has been out of his medications for a few months. He's had 2 visits to the emergency department for shortness of breath over the last 1 month. He tells me that he was in his usual state of health about a couple days ago when he started developing shortness of breath with wheezing. Has had a cough. It has been dry. Denies any fever. No nausea, vomiting. Has had some chest tightness, but mainly with coughing. He ran out of his medications 2-3 months ago. He has not established with a primary care physician here yet. He does have Medicaid. Symptoms are not getting better despite treatment at home with nebulizer treatments and so he decided to come into the hospital.  In the emergency department, patient was given a pleasant treatments, steroids. He has not improved much. He remains hypoxic He will need hospitalization for further management.  Home Medications: Prior to Admission medications   Medication Sig Start Date End Date Taking? Authorizing Provider  albuterol (PROVENTIL) (2.5 MG/3ML) 0.083% nebulizer solution Take 3 mLs (2.5 mg total) by nebulization every 6 (six) hours as needed for wheezing or shortness of breath. 09/03/16  Yes Mancel BaleWentz, Elliott, MD  amLODipine (NORVASC) 5 MG tablet Take 1 tablet (5 mg total) by mouth daily. 08/03/16 09/19/17 Yes Fawze, Mina A, PA-C  ipratropium (ATROVENT) 0.02 % nebulizer solution Take 2.5 mLs (0.5 mg total) by nebulization every 4 (four) hours as needed for wheezing or shortness of breath. 08/03/16  Yes Fawze, Mina A, PA-C  lisinopril (PRINIVIL,ZESTRIL) 10 MG tablet Take 1 tablet (10 mg total) by mouth daily.  08/03/16  Yes Fawze, Mina A, PA-C  predniSONE (DELTASONE) 20 MG tablet Take 2 tablets (40 mg total) by mouth daily. 09/03/16  Yes Little, Ambrose Finlandachel Morgan, MD  Fluticasone-Salmeterol (ADVAIR DISKUS) 250-50 MCG/DOSE AEPB Inhale 1 puff into the lungs 2 (two) times daily. Patient not taking: Reported on 09/19/2016 08/03/16   Michela PitcherFawze, Mina A, PA-C    Allergies:  Allergies  Allergen Reactions  . Norflex [Orphenadrine] Hives    Past Medical History: Past Medical History:  Diagnosis Date  . Asthma   . Emphysema lung (HCC)   . Hypertension     Past Surgical History:  Procedure Laterality Date  . HERNIA REPAIR      Social History: Patient lives currently in [place] with his brother. He has cut down significantly on the amount of cigarettes he smokes. He used to smoke 4 packs per day till about 6 years ago, but slowly came down to about 1 pack per day until a few months ago and now he is down to about 3-6 cigarettes a day. Occasional beer intake but not daily. No illicit drug use. Independent with his daily activity.   Family History:  Family History  Problem Relation Age of Onset  . Depression Mother   . Stroke Father      Review of Systems - History obtained from the patient General ROS: positive for  - fatigue Psychological ROS: negative Ophthalmic ROS: negative ENT ROS: negative Allergy and Immunology ROS: negative Hematological and Lymphatic ROS: negative Endocrine ROS: negative Respiratory ROS: as in hpi Cardiovascular ROS: as in hpi Gastrointestinal  ROS: no abdominal pain, change in bowel habits, or black or bloody stools Genito-Urinary ROS: no dysuria, trouble voiding, or hematuria Musculoskeletal ROS: negative Neurological ROS: no TIA or stroke symptoms Dermatological ROS: negative  Physical Examination  Vitals:   09/19/16 0730 09/19/16 0744 09/19/16 0800 09/19/16 0817  BP: (!) 138/124 (!) 138/124 (!) 133/103   Pulse: (!) 111 (!) 103 (!) 106   Resp: 17 13 (!) 21     Temp:  98.2 F (36.8 C)    TempSrc:  Oral    SpO2: (!) 89% 92% 90% 93%    BP (!) 133/103   Pulse (!) 106   Temp 98.2 F (36.8 C) (Oral)   Resp (!) 21   SpO2 93%   General appearance: alert, cooperative, appears stated age and no distress Head: Normocephalic, without obvious abnormality, atraumatic Eyes: conjunctivae/corneas clear. PERRL, EOM's intact.  Throat: lips, mucosa, and tongue normal; teeth and gums normal Neck: no adenopathy, no carotid bruit, no JVD, supple, symmetrical, trachea midline and thyroid not enlarged, symmetric, no tenderness/mass/nodules Resp: Diffuse wheezing heard bilaterally. No definite crackles. No rhonchi. He is tachypneic. No significant use of accessory muscles. Cardio: SN. S2 is slightly tachycardic, regular. S3, S4. No rubs, murmurs, or bruit GI: soft, non-tender; bowel sounds normal; no masses,  no organomegaly Extremities: extremities normal, atraumatic, no cyanosis or edema Pulses: 2+ and symmetric Skin: Skin color, texture, turgor normal. No rashes or lesions Lymph nodes: Cervical, supraclavicular, and axillary nodes normal. Neurologic: No focal deficits   Labs on Admission: I have personally reviewed following labs and imaging studies  CBC:  Recent Labs Lab 09/19/16 0739 09/19/16 0750  WBC 6.7  --   NEUTROABS 5.9  --   HGB 13.2 15.3  HCT 39.4 45.0  MCV 70.4*  --   PLT 247  --    Basic Metabolic Panel:  Recent Labs Lab 09/19/16 0750  NA 135  K 4.1  CL 97*  GLUCOSE 137*  BUN 8  CREATININE 0.50*   GFR: CrCl cannot be calculated (Unknown ideal weight.).   Radiological Exams on Admission: Dg Chest 2 View  Result Date: 09/19/2016 CLINICAL DATA:  Increasing shortness of breath. EXAM: CHEST  2 VIEW COMPARISON:  Most recent comparison radiograph 09/03/2016, most remote CT 08/03/2016 FINDINGS: Again seen hyperinflation and emphysema. Irregular opacity in the right upper lung is stable from prior exams. Normal heart size and  mediastinal contours. No pulmonary edema, focal airspace disease, pleural fluid or pneumothorax. No acute osseous abnormalities. IMPRESSION: 1. Chronic hyperinflation and emphysema consistent with COPD. 2. Probable right upper lobe scarring, stable from prior exams, however incompletely characterized radiographically. In absence of more remote comparisons, consider nonemergent chest CT characterization to exclude presence of underline pulmonary nodule. Electronically Signed   By: Rubye Oaks M.D.   On: 09/19/2016 06:25    My interpretation of Electrocardiogram: Sinus tachycardia 107 beats a minute. Normal axis. Intervals are normal. Prominent T waves noted. Some LVH criteria present.   Problem List  Principal Problem:   COPD with acute exacerbation (HCC) Active Problems:   Acute respiratory failure with hypoxia (HCC)   Emphysema of lung (HCC)   Essential hypertension   Hypercholesterolemia   COPD exacerbation (HCC)   Assessment: This is a 61 year old African-American male with a past medical history of emphysema who unfortunately continues to smoke cigarettes, presents with shortness of breath, wheezing. He was noted to be hypoxic. He has acute COPD exacerbation.  Plan: #1 acute, respiratory failure with hypoxia: This  is secondary to his COPD exacerbation. Continue oxygen. He tells me that he used to be an on home oxygen at about 2012 and he was taken off of it as his sats were doing okay. He will need home oxygen assessment prior to discharge. Other treatment for COPD exacerbation as discussed below.  #2 acute COPD exacerbation: Patient unfortunately continues to smoke cigarettes. He was counseled. Never lies her treatments, steroids. No recent treatment with antibiotics, so we will give him Levaquin. Pulmicort nebulizations will be provided. Oxygen as discussed above.  #3 abnormal chest x-ray: Increased opacity in the right upper lung was noted on chest x-ray stable compared to  previous films. Will need the further evaluation with a CT scan. Since he is a smoker we will proceed with the scan during this visit..  #4 history of essential hypertension: Continue with amlodipine and lisinopril. Monitor blood pressures closely. He will need prescriptions at discharge.  #5 Tobacco abuse: Nicotine patch. Counseled. He is cutting down slowly.  DVT Prophylaxis: Lovenox Code Status: Full code Family Communication: Discussed with the patient  Consults called: None   Severity of Illness: The appropriate patient status for this patient is INPATIENT. Inpatient status is judged to be reasonable and necessary in order to provide the required intensity of service to ensure the patient's safety. The patient's presenting symptoms, physical exam findings, and initial radiographic and laboratory data in the context of their chronic comorbidities is felt to place them at high risk for further clinical deterioration. Furthermore, it is not anticipated that the patient will be medically stable for discharge from the hospital within 2 midnights of admission. The following factors support the patient status of inpatient.   " The patient's presenting symptoms include shortness of breath, wheezing. " The worrisome physical exam findings include diffuse wheezing. " The initial radiographic and laboratory data are worrisome because of COPD exacerbation. " The chronic co-morbidities include hypertension.   * I certify that at the point of admission it is my clinical judgment that the patient will require inpatient hospital care spanning beyond 2 midnights from the point of admission due to high intensity of service, high risk for further deterioration and high frequency of surveillance required.*  Further management decisions will depend on results of further testing and patient's response to treatment.   [facility]  [facility] Pager [phone]  If 7PM-7AM, please contact  night-coverage [email or web address] Password [email or web address]  09/19/2016, 9:18 AM

## 2016-09-19 NOTE — ED Notes (Signed)
Bed: [id number] Expected date:  Expected time:  Means of arrival:  Comments: 61 yo M/ Shortness of breath

## 2016-09-19 NOTE — ED Provider Notes (Signed)
[facility]-EMERGENCY DEPT Provider Note   CSN: [id number] Arrival date & time: 09/19/16  0447     History   Chief Complaint Chief Complaint  Patient presents with  . Shortness of Breath    HPI Matthew Morrison is a 61 y.o. male.  HPI  Pt comes in with cc of DIB. Pt has hx of emphysema - not on O2 now. Pt is not an active smoker. PT reports that his breathing started getting worse yday. Pt has no new cough or phlegm - as the cough is dry. Pt has wheezing, and he has some chest tightness. Pt thinks that his breathing got worse due to increased humidity.  Pt has no hx of PE, DVT and denies any exogenous hormone (testosterone / estrogen) use, long distance travels or surgery in the past 6 weeks, active cancer, recent immobilization.   Past Medical History:  Diagnosis Date  . Asthma   . Emphysema lung (HCC)   . Hypertension     There are no active problems to display for this patient.   Past Surgical History:  Procedure Laterality Date  . HERNIA REPAIR         Home Medications    Prior to Admission medications   Medication Sig Start Date End Date Taking? Authorizing Provider  albuterol (PROVENTIL) (2.5 MG/3ML) 0.083% nebulizer solution Take 3 mLs (2.5 mg total) by nebulization every 6 (six) hours as needed for wheezing or shortness of breath. 09/03/16  Yes Mancel Bale, MD  amLODipine (NORVASC) 5 MG tablet Take 1 tablet (5 mg total) by mouth daily. 08/03/16 09/19/17 Yes Fawze, Mina A, PA-C  ipratropium (ATROVENT) 0.02 % nebulizer solution Take 2.5 mLs (0.5 mg total) by nebulization every 4 (four) hours as needed for wheezing or shortness of breath. 08/03/16  Yes Fawze, Mina A, PA-C  lisinopril (PRINIVIL,ZESTRIL) 10 MG tablet Take 1 tablet (10 mg total) by mouth daily. 08/03/16  Yes Fawze, Mina A, PA-C  predniSONE (DELTASONE) 20 MG tablet Take 2 tablets (40 mg total) by mouth daily. 09/03/16  Yes Little, Ambrose Finland, MD  Fluticasone-Salmeterol (ADVAIR DISKUS) 250-50 MCG/DOSE AEPB Inhale 1  puff into the lungs 2 (two) times daily. Patient not taking: Reported on 09/19/2016 08/03/16   [name], PA-C    Family History History reviewed. No pertinent family history.  Social History Social History  Substance Use Topics  . Smoking status: Former Games developer  . Smokeless tobacco: Never Used  . Alcohol use Yes     Allergies   Norflex [orphenadrine]   Review of Systems Review of Systems  Constitutional: Positive for activity change.  Respiratory: Positive for cough, chest tightness, shortness of breath and wheezing.   Cardiovascular: Positive for chest pain.  Allergic/Immunologic: Negative for immunocompromised state.  Hematological: Does not bruise/bleed easily.  All other systems reviewed and are negative.    Physical Exam Updated Vital Signs BP (!) 133/103   Pulse (!) 106   Temp 98.2 F (36.8 C) (Oral)   Resp (!) 21   SpO2 93%   Physical Exam  Constitutional: He is oriented to person, place, and time. He appears well-developed.  HENT:  Head: Normocephalic and atraumatic.  Eyes: Conjunctivae and EOM are normal. Pupils are equal, round, and reactive to light.  Neck: Normal range of motion. Neck supple.  Cardiovascular: Regular rhythm, normal heart sounds and intact distal pulses.   tachycardia  Pulmonary/Chest: Effort normal. No respiratory distress. He has wheezes.  Abdominal: Soft. Bowel sounds are normal. He exhibits no distension.  There is no tenderness. There is no rebound and no guarding.  Neurological: He is alert and oriented to person, place, and time.  Skin: Skin is warm.  Nursing note and vitals reviewed.    ED Treatments / Results  Labs (all labs ordered are listed, but only abnormal results are displayed) Labs Reviewed  CBC WITH DIFFERENTIAL/PLATELET - Abnormal; Notable for the following:       Result Value   MCV 70.4 (*)    MCH 23.6 (*)    Lymphs Abs 0.6 (*)    All other components within normal limits  I-STAT CHEM 8, ED - Abnormal;  Notable for the following:    Chloride 97 (*)    Creatinine, Ser 0.50 (*)    Glucose, Bld 137 (*)    Calcium, Ion 1.11 (*)    All other components within normal limits    EKG  EKG Interpretation  Date/Time:  Monday September 19 2016 05:05:48 EDT Ventricular Rate:  107 PR Interval:    QRS Duration: 83 QT Interval:  337 QTC Calculation: 450 R Axis:   76 Text Interpretation:  Sinus tachycardia Biatrial enlargement Anterior infarct, old No acute changes No significant change since last tracing Confirmed by Derwood Kaplan [phone]) on 09/19/2016 6:00:49 AM Also confirmed by Derwood Kaplan [id number]), editor Elita Quick ([id number])  on 09/19/2016 7:08:11 AM       Radiology Dg Chest 2 View  Result Date: 09/19/2016 CLINICAL DATA:  Increasing shortness of breath. EXAM: CHEST  2 VIEW COMPARISON:  Most recent comparison radiograph 09/03/2016, most remote CT 08/03/2016 FINDINGS: Again seen hyperinflation and emphysema. Irregular opacity in the right upper lung is stable from prior exams. Normal heart size and mediastinal contours. No pulmonary edema, focal airspace disease, pleural fluid or pneumothorax. No acute osseous abnormalities. IMPRESSION: 1. Chronic hyperinflation and emphysema consistent with COPD. 2. Probable right upper lobe scarring, stable from prior exams, however incompletely characterized radiographically. In absence of more remote comparisons, consider nonemergent chest CT characterization to exclude presence of underline pulmonary nodule. Electronically Signed   By: Rubye Oaks M.D.   On: 09/19/2016 06:25    Procedures Procedures (including critical care time)  CRITICAL CARE Performed by: Derwood Kaplan   Total critical care time: 41 minutes  Critical care time was exclusive of separately billable procedures and treating other patients.  Critical care was necessary to treat or prevent imminent or life-threatening deterioration.  Critical care was time spent personally  by me on the following activities: development of treatment plan with patient and/or surrogate as well as nursing, discussions with consultants, evaluation of patient's response to treatment, examination of patient, obtaining history from patient or surrogate, ordering and performing treatments and interventions, ordering and review of laboratory studies, ordering and review of radiographic studies, pulse oximetry and re-evaluation of patient's condition.   Medications Ordered in ED Medications  albuterol (PROVENTIL) (2.5 MG/3ML) 0.083% nebulizer solution 5 mg (5 mg Nebulization Given 09/19/16 0502)  albuterol (PROVENTIL,VENTOLIN) solution continuous neb (10 mg/hr Nebulization Given 09/19/16 0817)  dexamethasone (DECADRON) injection 10 mg (10 mg Intravenous Given 09/19/16 0805)     Initial Impression / Assessment and Plan / ED Course  I have reviewed the triage vital signs and the nursing notes.  Pertinent labs & imaging results that were available during my care of the patient were reviewed by me and considered in my medical decision making (see chart for details).  Clinical Course as of Sep 19 820  Mon Sep 19, 2016  0819  Pt continues to have wheezing and is noted to have O2 sats at 87% at rest. We will admit to obs for COPD exacerbation. 2nd round of nebs ordered.  [AN]    Clinical Course User Index [AN] Derwood KaplanNanavati, Berania Peedin, MD    Pt comes in with cc of dib, wheezing/ PT has hx of emphysema. He is not a smoker, and denies new cough or new phlegm. Pt has wheezing diffusely, and air movement is tight. We will order hour long nebs and reassess.  Final Clinical Impressions(s) / ED Diagnoses   Final diagnoses:  COPD with acute exacerbation [facility](HCC)    New Prescriptions New Prescriptions   No medications on file     Derwood KaplanNanavati, Karrisa Didio, MD 09/19/16 [phone]

## 2016-09-19 NOTE — ED Triage Notes (Signed)
Brought in by EMS from home with c/o shortness of breath, onset 4 hours ago.  Pt reported that he took his neb tx and albuterol inhaler but without relief.  Pt was "tripoding" on EMS' arrival.  Pt was given Solu-Medrol 125 mg IV and duo-neb on scene.  Pt arrived to ED with his 2nd duo-neb treatment on-going.

## 2016-09-20 ENCOUNTER — Encounter (HOSPITAL_COMMUNITY): Payer: Self-pay

## 2016-09-20 LAB — URINALYSIS, ROUTINE W REFLEX MICROSCOPIC
Bilirubin Urine: NEGATIVE
GLUCOSE, UA: NEGATIVE mg/dL
Hgb urine dipstick: NEGATIVE
Ketones, ur: NEGATIVE mg/dL
Leukocytes, UA: NEGATIVE
Nitrite: NEGATIVE
PH: 6 (ref 5.0–8.0)
Protein, ur: NEGATIVE mg/dL
SPECIFIC GRAVITY, URINE: 1.006 (ref 1.005–1.030)

## 2016-09-20 LAB — BASIC METABOLIC PANEL
ANION GAP: 8 (ref 5–15)
BUN: 10 mg/dL (ref 6–20)
CHLORIDE: 99 mmol/L — AB (ref 101–111)
CO2: 30 mmol/L (ref 22–32)
Calcium: 9.3 mg/dL (ref 8.9–10.3)
Creatinine, Ser: 0.71 mg/dL (ref 0.61–1.24)
GFR calc non Af Amer: >60 mL/min (ref >60)
Glucose, Bld: 143 mg/dL — ABNORMAL HIGH (ref 65–99)
POTASSIUM: 4.1 mmol/L (ref 3.5–5.1)
Sodium: 137 mmol/L (ref 135–145)

## 2016-09-20 LAB — CBC
HEMATOCRIT: 42.6 % (ref 39.0–52.0)
HEMOGLOBIN: 14 g/dL (ref 13.0–17.0)
MCH: 23.5 pg — ABNORMAL LOW (ref 26.0–34.0)
MCHC: 32.9 g/dL (ref 30.0–36.0)
MCV: 71.5 fL — AB (ref 78.0–100.0)
Platelets: 256 10*3/uL (ref 150–400)
RBC: 5.96 MIL/uL — AB (ref 4.22–5.81)
RDW: 15.9 % — ABNORMAL HIGH (ref 11.5–15.5)
WBC: 9.6 10*3/uL (ref 4.0–10.5)

## 2016-09-20 LAB — HIV ANTIBODY (ROUTINE TESTING W REFLEX): HIV Screen 4th Generation wRfx: NONREACTIVE

## 2016-09-20 LAB — PSA: Prostatic Specific Antigen: 1.44 ng/mL (ref 0.00–4.00)

## 2016-09-20 MED ORDER — AMLODIPINE BESYLATE 10 MG PO TABS
10.0000 mg | ORAL_TABLET | Freq: Every day | ORAL | Status: DC
  Administered 2016-09-21 – 2016-09-26 (×6): 10 mg via ORAL
  Filled 2016-09-20 (×6): qty 1

## 2016-09-20 MED ORDER — IPRATROPIUM-ALBUTEROL 0.5-2.5 (3) MG/3ML IN SOLN
3.0000 mL | RESPIRATORY_TRACT | Status: DC
  Administered 2016-09-20 – 2016-09-23 (×18): 3 mL via RESPIRATORY_TRACT
  Filled 2016-09-20 (×18): qty 3

## 2016-09-20 MED ORDER — METHYLPREDNISOLONE SODIUM SUCC 125 MG IJ SOLR
80.0000 mg | Freq: Two times a day (BID) | INTRAMUSCULAR | Status: DC
  Administered 2016-09-20 – 2016-09-23 (×6): 80 mg via INTRAVENOUS
  Filled 2016-09-20 (×6): qty 2

## 2016-09-20 MED ORDER — ARFORMOTEROL TARTRATE 15 MCG/2ML IN NEBU
15.0000 ug | INHALATION_SOLUTION | Freq: Two times a day (BID) | RESPIRATORY_TRACT | Status: DC
  Administered 2016-09-20 – 2016-09-23 (×7): 15 ug via RESPIRATORY_TRACT
  Filled 2016-09-20 (×7): qty 2

## 2016-09-20 MED ORDER — BOOST / RESOURCE BREEZE PO LIQD
1.0000 | Freq: Three times a day (TID) | ORAL | Status: DC
  Administered 2016-09-20 – 2016-09-26 (×13): 1 via ORAL

## 2016-09-20 MED ORDER — POLYETHYLENE GLYCOL 3350 17 G PO PACK
17.0000 g | PACK | Freq: Every day | ORAL | Status: DC
  Administered 2016-09-20 – 2016-09-26 (×7): 17 g via ORAL
  Filled 2016-09-20 (×7): qty 1

## 2016-09-20 NOTE — Progress Notes (Signed)
Initial Nutrition Assessment  DOCUMENTATION CODES:   Non-severe (moderate) malnutrition in context of chronic illness  INTERVENTION:   Provide Boost Breeze po TID, each supplement provides 250 kcal and 9 grams of protein Encourage PO intake RD to continue to monitor   NUTRITION DIAGNOSIS:   Malnutrition (Moderate) related to chronic illness (COPD) as evidenced by moderate depletion of body fat, moderate depletions of muscle mass.  GOAL:   Patient will meet greater than or equal to 90% of their needs  MONITOR:   PO intake, Supplement acceptance, Labs, Weight trends, I & O's  REASON FOR ASSESSMENT:   Consult COPD Protocol  ASSESSMENT:   61 y.o. male with a past medical history of emphysema, hypertension, arthritis, hypercholesterolemia, who moved to [place]. He has been out of his medications for a few months. He's had 2 visits to the emergency department for shortness of breath over the last 1 month. He tells me that he was in his usual state of health about a couple days ago when he started developing shortness of breath with wheezing. Has had a cough. It has been dry. Denies any fever. No nausea, vomiting. Has had some chest tightness, but mainly with coughing. He ran out of his medications 2-3 months ago. He has not established with a primary care physician here yet. He does have Medicaid. Symptoms are not getting better despite treatment at home with nebulizer treatments and so he decided to come into the hospital.  Patient in room with no family at bedside. Pt reports having trouble eating d/t his SOB. Pt states he gets full quickly and it feels like some foods get stuck in his esophagus. Pt has made himself vomit to relieve this sometimes. Pt states he is isn't eating well here, PO intake documented at 100% last night.  Pt has tried Boost drinks in the past and willing to try [employer] Breeze. Emphasized the importance of protein foods with every meal and  snack given increased needs. States he feels like he eats all the time but still he loses weight.  Per patient, has lost 7 lb but unable to establish time frame. Nutrition-Focused physical exam completed. Findings are moderate fat depletion, moderate muscle depletion, and no edema.   Labs reviewed. Medications: Miralax packet daily  Diet Order:  Diet 2 gram sodium Room service appropriate? Yes; Fluid consistency: Thin  Skin:  Reviewed, no issues  Last BM:  6/18  Height:   Ht Readings from Last 1 Encounters:  09/19/16 5\' 4"  (1.626 m)    Weight:   Wt Readings from Last 1 Encounters:  09/19/16 109 lb 2 oz (49.5 kg)    Ideal Body Weight:  59.1 kg  BMI:  Body mass index is 18.73 kg/m.  Estimated Nutritional Needs:   Kcal:  1500-1700  Protein:  70-80g  Fluid:  1.7L/day  EDUCATION NEEDS:   Education needs addressed  Tilda FrancoLindsey Macaria Bias, MS, RD, LDN Pager: [phone] After Hours Pager: [phone]

## 2016-09-20 NOTE — Progress Notes (Signed)
PROGRESS NOTE        PATIENT DETAILS Name: Matthew Morrison Age: 61 y.o. Sex: male Date of Birth: 1955/05/05 Admit Date: 09/19/2016 Admitting Physician [name], MD EXB:[id number], No Pcp Per  Brief Narrative: Patient is a 61 y.o. male with past medical history of COPD and hypertension. He has had a total of 3 ED visits for shortness of breath since he moved here in March. Patient has been out of his rescue inhaler and experiencing worsening shortness of breath at home. He smokes 3-4 cigarettes a day but is determined to quit, now using nicotine patches. He was admitted due to hypoxia, refractory to the steroid nebulizer treatments given in the ED.   Subjective: Patient sitting up in bed, has some shortness of breath when speaking. He is complaining of some urinary urgency and difficulty in making bowel movements. He denies chest pain, fevers, chills, abdominal pain, dysuria or nausea.  Assessment/Plan: COPD with acute exacerbation [facility]): Patient continues to have shortness of breath when talking, moving and changing positions. He is on 2L of oxygen and saturation is in the mid 90's.  - Continue oxygen, albuterol, ipratroprium-albuterol and budesonide nebulizer.  - Start arformoterol nebulizer.  - Continue levofloxacin. - Continue mucinex - Decrease methylprednisolone to BID dosing.  Emphysema of lung (HCC): CXR indicated some changes in the right upper lobe and a CT was ordered. Results showed scarring of the right upper lobe and emphysema of the lungs. No nodules or pneumonia suspected.   Urinary urgency: for 1 week patient has been experiencing the need to urinate but voiding only a small amount. Denies other urinary symptoms.  - PSA ordered, pending results.   Changes in bowel habits: Patient has been experiencing the need to void but states that for about a week, "only water comes out" and when he looks in the toilet, the discharge appears similar to mucous.  States he had 2 normal bowel movements in the last week. Suspect there may be some constipation.  - Start Miralax.  Essential hypertension: Patient had run out of home BP meds as well, admitted BP was 179/128, trending down.  - Continue amlodipine and lisinopril and monitor closely.   DVT Prophylaxis: Prophylactic Lovenox   Code Status: Full code   Family Communication: None at bedside  Disposition Plan: Remain inpatient-home in next 1-2 days when clinically improved.  Antimicrobial agents: Anti-infectives    Start     Dose/Rate Route Frequency Ordered Stop   09/19/16 1100  levofloxacin (LEVAQUIN) IVPB 500 mg     500 mg 100 mL/hr over 60 Minutes Intravenous Every 24 hours 09/19/16 1004        Procedures: None  CONSULTS:  None  Time spent: 25 minutes-Greater than 50% of this time was spent in counseling, explanation of diagnosis, planning of further management, and coordination of care.  MEDICATIONS: Scheduled Meds: . amLODipine  5 mg Oral Daily  . arformoterol  15 mcg Nebulization BID  . budesonide (PULMICORT) nebulizer solution  0.25 mg Nebulization BID  . enoxaparin (LOVENOX) injection  40 mg Subcutaneous Q24H  . guaiFENesin  600 mg Oral BID  . ipratropium-albuterol  3 mL Nebulization Q4H  . lisinopril  10 mg Oral Daily  . methylPREDNISolone (SOLU-MEDROL) injection  80 mg Intravenous Q12H  . nicotine  14 mg Transdermal Daily  . polyethylene glycol  17 g Oral Daily  .  sodium chloride flush  3 mL Intravenous Q12H   Continuous Infusions: . levofloxacin (LEVAQUIN) IV 500 mg (09/20/16 1039)   PRN Meds:.acetaminophen **OR** acetaminophen, albuterol, ondansetron **OR** ondansetron (ZOFRAN) IV, oxyCODONE   PHYSICAL EXAM: Vital signs: Vitals:   09/19/16 2016 09/20/16 0529 09/20/16 0850 09/20/16 0858  BP: (!) 136/91 (!) 139/55    Pulse: (!) 119 (!) 101    Resp: 20 19    Temp: 97.6 F (36.4 C) 97.7 F (36.5 C)    TempSrc: Oral Oral    SpO2: 97% 100% 96% 96%    Weight:      Height:       Filed Weights   09/19/16 1014  Weight: 49.5 kg (109 lb 2 oz)   Body mass index is 18.73 kg/m.   General appearance: Awake, alert. Speech clear HEENT: Atraumatic and Normocephalic Resp: Some accessory muscle use. Wheezing bilaterally with scattered rhonchi. CVS: RRR, no murmurs.  GI: Normoactive bowel sounds in all 4 quadrants, Non tender and non distended without gaurding. Extremities: B/L Lower Ext shows no edema, both legs are warm to touch Neurology: Following commands, speech clear, No focal neuro deficits. Psychiatric: Normal judgment and insight. Alert and oriented x 3. Appropriate mood Skin: warm and dry   I have personally reviewed following labs and imaging studies  LABORATORY DATA: CBC:  Recent Labs Lab 09/19/16 0739 09/19/16 0750 09/20/16 0618  WBC 6.7  --  9.6  NEUTROABS 5.9  --   --   HGB 13.2 15.3 14.0  HCT 39.4 45.0 42.6  MCV 70.4*  --  71.5*  PLT 247  --  256    Basic Metabolic Panel:  Recent Labs Lab 09/19/16 0750 09/20/16 0618  NA 135 137  K 4.1 4.1  CL 97* 99*  CO2  --  30  GLUCOSE 137* 143*  BUN 8 10  CREATININE 0.50* 0.71  CALCIUM  --  9.3    GFR: Estimated Creatinine Clearance: 70.5 mL/min (by C-G formula based on SCr of 0.71 mg/dL).  Liver Function Tests: No results for input(s): AST, ALT, ALKPHOS, BILITOT, PROT, ALBUMIN in the last 168 hours. No results for input(s): LIPASE, AMYLASE in the last 168 hours. No results for input(s): AMMONIA in the last 168 hours.  Coagulation Profile: No results for input(s): INR, PROTIME in the last 168 hours.  Cardiac Enzymes: No results for input(s): CKTOTAL, CKMB, CKMBINDEX, TROPONINI in the last 168 hours.  BNP (last 3 results) No results for input(s): PROBNP in the last 8760 hours.  HbA1C: No results for input(s): HGBA1C in the last 72 hours.  CBG: No results for input(s): GLUCAP in the last 168 hours.  Lipid Profile: No results for input(s): CHOL,  HDL, LDLCALC, TRIG, CHOLHDL, LDLDIRECT in the last 72 hours.  Thyroid Function Tests: No results for input(s): TSH, T4TOTAL, FREET4, T3FREE, THYROIDAB in the last 72 hours.  Anemia Panel: No results for input(s): VITAMINB12, FOLATE, FERRITIN, TIBC, IRON, RETICCTPCT in the last 72 hours.  Urine analysis: No results found for: COLORURINE, APPEARANCEUR, LABSPEC, PHURINE, GLUCOSEU, HGBUR, BILIRUBINUR, KETONESUR, PROTEINUR, UROBILINOGEN, NITRITE, LEUKOCYTESUR  Sepsis Labs: Lactic Acid, Venous No results found for: LATICACIDVEN  MICROBIOLOGY: No results found for this or any previous visit (from the past 240 hour(s)).  RADIOLOGY STUDIES/RESULTS: Dg Chest 2 View  Result Date: 09/19/2016 CLINICAL DATA:  Increasing shortness of breath. EXAM: CHEST  2 VIEW COMPARISON:  Most recent comparison radiograph 09/03/2016, most remote CT 08/03/2016 FINDINGS: Again seen hyperinflation and emphysema. Irregular opacity in  the right upper lung is stable from prior exams. Normal heart size and mediastinal contours. No pulmonary edema, focal airspace disease, pleural fluid or pneumothorax. No acute osseous abnormalities. IMPRESSION: 1. Chronic hyperinflation and emphysema consistent with COPD. 2. Probable right upper lobe scarring, stable from prior exams, however incompletely characterized radiographically. In absence of more remote comparisons, consider nonemergent chest CT characterization to exclude presence of underline pulmonary nodule. Electronically Signed   By: Rubye Oaks M.D.   On: 09/19/2016 06:25   Dg Chest 2 View  Result Date: 09/03/2016 CLINICAL DATA:  Acute onset of shortness of breath. Initial encounter. EXAM: CHEST  2 VIEW COMPARISON:  Chest radiograph performed 08/23/2016 FINDINGS: The lungs are hyperexpanded, with flattening of the hemidiaphragms compatible with COPD. Minimal scarring is again noted at the right upper lung zone. There is no evidence of pleural effusion or pneumothorax. The  heart is normal in size; the mediastinal contour is within normal limits. No acute osseous abnormalities are seen. IMPRESSION: Findings of COPD.  Lungs otherwise clear. Electronically Signed   By: Roanna Raider M.D.   On: 09/03/2016 02:30   Dg Chest 2 View  Result Date: 08/23/2016 CLINICAL DATA:  Shortness of breath tonight.  History of COPD. EXAM: CHEST  2 VIEW COMPARISON:  08/03/2016 FINDINGS: Shallow prominent emphysematous changes in the lungs. Scattered fibrosis throughout the lungs but most prominent in the upper lung region. Focal area of scarring again demonstrated in the right upper lung which could represent early pulmonary nodule. Consider follow-up CT for further evaluation of this lesion. No airspace disease or consolidation. No blunting of costophrenic angles. No pneumothorax. Heart size and pulmonary vascularity are normal. IMPRESSION: Emphysematous changes and scattered fibrosis throughout the lungs. Possible nodule or focal scarring in the right upper lung. No airspace disease or consolidation. Electronically Signed   By: Burman Nieves M.D.   On: 08/23/2016 23:31   Ct Chest Wo Contrast  Result Date: 09/19/2016 CLINICAL DATA:  Followup abnormal chest x-ray. Possible pulmonary nodule. History of emphysema. EXAM: CT CHEST WITHOUT CONTRAST TECHNIQUE: Multidetector CT imaging of the chest was performed following the standard protocol without IV contrast. COMPARISON:  Chest x-ray 09/19/2016 FINDINGS: Cardiovascular: The heart is normal in size. No pericardial effusion. No pericardial effusion. Mild tortuosity, ectasia and calcification of the thoracic aorta and branch vessels. Coronary artery calcifications are noted. Mediastinum/Nodes: No mediastinal or hilar mass or lymphadenopathy. The esophagus is grossly normal. Lungs/Pleura: Advanced emphysematous changes involving the lungs with areas of pulmonary scarring. The right upper lobe density has the appearance of scarring change. No discrete  mass or pulmonary lesion is identified. No worrisome pulmonary nodules and no acute overlying pulmonary process. No pleural effusion. Upper Abdomen: No significant upper abdominal findings. Chest wall/ Musculoskeletal: No chest wall mass, supraclavicular or axillary adenopathy. Small scattered lymph nodes are noted. The thyroid gland is grossly normal. No significant bony findings. IMPRESSION: 1. Advanced emphysematous changes and pulmonary scarring. The abnormality on the chest x-ray is consistent with scarring. No worrisome pulmonary lesions, masses or nodules. 2. No acute pulmonary findings. 3. Age advanced atherosclerotic calcifications involving the aorta and coronary arteries. 4. No mediastinal or hilar mass or adenopathy. Aortic Atherosclerosis (ICD10-I70.0) and Emphysema (ICD10-J43.9). Electronically Signed   By: Rudie Meyer M.D.   On: 09/19/2016 11:45     LOS: 1 day   Willa Frater, PA-S  [facility]  Attending MD note  Patient was seen, examined,treatment plan was discussed with the PA-S Willa Frater.  I have  personally reviewed the clinical findings, lab, imaging studies and management of this patient in detail. I agree with the documentation, as recorded by the PA-S. Note MD documentation noted below   Patient is 61 year old male with past medical history of COPD and hypertension with recurrent ED visits for shortness of breath. Patient report using his rescue inhalers at home but no improvement was noted. He is currently smoker and has multiple admissions for COPD. Patient is admitted with COPD exacerbation and hypoxia  On Exam: Gen. exam: Awake, alert, not in any distress Chest: Decreased air entry bilaterally, inspiratory and expiratory wheezing 1:2, scattered bronchitis. CVS: S1-S2 regular, no murmurs Abdomen: Soft, nontender and nondistended Neurology: Non-focal Skin: No rash or lesions  Impression: Acute respiratory failure with hypoxia secondary to  COPD Essential hypertension Tobacco abuse  Plan: Nebulizer adjusted, DuoNeb every 4 hours scheduled, with 2 hours when necessary. Brovana and Pulmicort added. Continue Solu-Medrol taper  Blood pressure slightly above goal, will increase Norvasc to 10 mg. Continue to monitor BP Patient complaining of difficulty urinating, UA negative and PSA normal.  Rest as above  Latrelle DodrillEdwin Silva, MD   If 7PM-7AM, please contact night-coverage [email or web address] Password [email or web address] 09/20/2016, 11:22 AM

## 2016-09-21 DIAGNOSIS — J439 Emphysema, unspecified: Secondary | ICD-10-CM

## 2016-09-21 DIAGNOSIS — F17200 Nicotine dependence, unspecified, uncomplicated: Secondary | ICD-10-CM

## 2016-09-21 DIAGNOSIS — E7800 Pure hypercholesterolemia, unspecified: Secondary | ICD-10-CM

## 2016-09-21 DIAGNOSIS — J984 Other disorders of lung: Secondary | ICD-10-CM

## 2016-09-21 DIAGNOSIS — R938 Abnormal findings on diagnostic imaging of other specified body structures: Secondary | ICD-10-CM

## 2016-09-21 LAB — RESPIRATORY PANEL BY PCR
Adenovirus: NOT DETECTED
Bordetella pertussis: NOT DETECTED
CHLAMYDOPHILA PNEUMONIAE-RVPPCR: NOT DETECTED
CORONAVIRUS 229E-RVPPCR: NOT DETECTED
Coronavirus HKU1: NOT DETECTED
Coronavirus NL63: NOT DETECTED
Coronavirus OC43: NOT DETECTED
INFLUENZA A-RVPPCR: NOT DETECTED
Influenza B: NOT DETECTED
MYCOPLASMA PNEUMONIAE-RVPPCR: NOT DETECTED
Metapneumovirus: NOT DETECTED
PARAINFLUENZA VIRUS 4-RVPPCR: NOT DETECTED
Parainfluenza Virus 1: NOT DETECTED
Parainfluenza Virus 2: NOT DETECTED
Parainfluenza Virus 3: NOT DETECTED
Respiratory Syncytial Virus: NOT DETECTED
Rhinovirus / Enterovirus: NOT DETECTED

## 2016-09-21 MED ORDER — HYDROXYZINE HCL 10 MG PO TABS
10.0000 mg | ORAL_TABLET | Freq: Three times a day (TID) | ORAL | Status: DC | PRN
  Administered 2016-09-21 – 2016-09-26 (×7): 10 mg via ORAL
  Filled 2016-09-21 (×8): qty 1

## 2016-09-21 MED ORDER — HYDROXYZINE HCL 10 MG PO TABS
10.0000 mg | ORAL_TABLET | ORAL | Status: AC
  Filled 2016-09-21: qty 1

## 2016-09-21 MED ORDER — SODIUM CHLORIDE 3 % IN NEBU
4.0000 mL | INHALATION_SOLUTION | Freq: Every day | RESPIRATORY_TRACT | Status: AC
  Administered 2016-09-21 – 2016-09-23 (×3): 4 mL via RESPIRATORY_TRACT
  Filled 2016-09-21 (×3): qty 4

## 2016-09-21 MED ORDER — LEVOFLOXACIN 500 MG PO TABS
500.0000 mg | ORAL_TABLET | Freq: Every day | ORAL | Status: DC
  Administered 2016-09-21 – 2016-09-25 (×5): 500 mg via ORAL
  Filled 2016-09-21 (×5): qty 1

## 2016-09-21 NOTE — Progress Notes (Signed)
Rt gave pt flutter valve pre MD order. Pt knows and understands how to use.

## 2016-09-21 NOTE — Progress Notes (Signed)
SATURATION QUALIFICATIONS: (This note is used to comply with regulatory documentation for home oxygen)  Patient Saturations on Room Air at Rest = 96%  Patient Saturations on Room Air while Ambulating = 84%  Patient Saturations on 2 Liters of oxygen while Ambulating = 98%  Please briefly explain why patient needs home oxygen: To keep O2 >92% while ambulating.

## 2016-09-21 NOTE — Progress Notes (Addendum)
PROGRESS NOTE    Matthew Morrison  AVW:[id number] DOB: [birth year] DOA: 09/19/2016 PCP: Patient, No Pcp Per   Brief Narrative:  Matthew Morrison is a 61 y.o. male with a past medical history of Emphysema, Hypertension, Arthritis, HLD and other comorbids who moved to [place] in March from [place]. He has been out of his medications for a few months. He's had 2 visits to the emergency department for shortness of breath over the last 1 month. He was in his usual state of health about a couple days ago when he started developing shortness of breath with wheezing. Has had a cough. It has been dry. Denies any fever. No nausea, vomiting. Has had some chest tightness, but mainly with coughing. Symptoms are not getting better despite treatment at home with nebulizer treatments and so he decided to come into the hospital. In the emergency department, patient was given a breathing treatments and admitted for Hypoxia that was refractory to the Steroid Neb Treatments given in the ED. He is slowly improving and still feels SOB.  Assessment & Plan:   Principal Problem:   COPD with acute exacerbation (HCC) Active Problems:   Acute respiratory failure with hypoxia (HCC)   Emphysema of lung (HCC)   Essential hypertension   Hypercholesterolemia   COPD exacerbation (HCC)  Acute Respiratory Failure with Hypoxia likely 2/2 to Severe COPD -Patient desaturated to 84% on Room Air while Ambulating -C/w Supplemental O2 and Wean As tolerated -C/w Continuous Pulse Oximetry and Maintain O2 Saturations >92% -Repeat CXR in AM -May need Pulmonary Consultation   COPD with Acute exacerbation [facility]):  -Patient continues to have shortness of breath when talking, moving and changing positions. He is on 2L of oxygen and saturation is in the mid 90's.  -Continue Supplemental O2, DuoNeb 3 mL q4h and Albuterol 2.5 mg Neb q2hprn for wheezing, Levofloxacin 500 mg po Daily, and with Methylprednisolone 80 mg IV q12h -C/w Arformoterol Neb 15  mcg and Budesonide 0.25 mg Neb RT BID -Continue Guaifenesin 600 mg po BID -Decrease methylprednisolone to BID dosing yesterday and will keep BID dosing today as patient is still wheezing tremendously. -Added Flutter Valve and NaCl Hypertonic Nebs 4 mL Daily -C/w Incentive Spirometry and added Hydroxazine 10 mg po TIDprn for anxiety associated with breathing  -Check Respiratory Virus Panel and place on Droplet Precautions  Emphysema of lung (HCC) -CXR indicated some changes in the right upper lobe and a CT was ordered.  -CT Results showed scarring of the right upper lobe and emphysema of the lungs. No nodules or pneumonia suspected.  -As above  Urinary Urgency -For 1 week patient has been experiencing the need to urinate but voiding only a small amount. Denies other urinary symptoms.  - PSA was normal and Urinalysis Negative.   Changes in bowel habits:  -Patient has been experiencing the need to void but states that for about a week, "only water comes out" and when he looks in the toilet, the discharge appears similar to mucous. States he had 2 normal bowel movements in the last week. Suspect there may be some constipation.  -Started Miralax 17 grams daily and patient states stools are more formed but loose -Continue to Monitor.  Essential Hypertension:  -Patient had run out of home BP meds as well, admission BP was 179/128, trending down.  -C/w Home Lisinopril 10 mg po Daily and Amlodipine 10 mg po Daily  Moderate Malnutrition in the Context of Chronic illness -Nutritionist Consulted and Recc's Appreciated -C/w Boost Breeze  1 Container po TID  Tobacco Abuse/Smoker -Smoking Cessation Counseling given -Nicotine 14 mg TD q24h  DVT prophylaxis: Enoxaparin 40 mg sq q24h Code Status: FULL CODE Family Communication: No family present at bedside Disposition Plan: Pending PT/OT Evaluation   Consultants:   None   Procedures: None   Antimicrobials:  Anti-infectives    Start      Dose/Rate Route Frequency Ordered Stop   09/19/16 1100  levofloxacin (LEVAQUIN) IVPB 500 mg     500 mg 100 mL/hr over 60 Minutes Intravenous Every 24 hours 09/19/16 1004       Subjective: Seen and examined at bedside and he stated he was feeling about the same breathing wise. Also states that he had loose stools after the Miralax. No Nausea or Vomiting. Still wheezing.   Objective: Vitals:   09/20/16 1929 09/20/16 1931 09/20/16 2030 09/21/16 0450  BP:   (!) 157/106 (!) 157/99  Pulse:   (!) 116 99  Resp:   20 16  Temp:   97.8 F (36.6 C) 97.9 F (36.6 C)  TempSrc:   Oral Oral  SpO2: 93% 93% 99% 99%  Weight:      Height:        Intake/Output Summary (Last 24 hours) at 09/21/16 0758 Last data filed at 09/21/16 0600  Gross per 24 hour  Intake              250 ml  Output                0 ml  Net              250 ml   Filed Weights   09/19/16 1014  Weight: 49.5 kg (109 lb 2 oz)   Examination: Physical Exam:  Constitutional: NAD and appears calm but slightly uncomfortable Eyes: Lids and conjunctivae normal, sclerae anicteric  ENMT: External Ears, Nose appear normal. Grossly normal hearing. Mucous membranes are moist.  Neck: Appears normal, supple, no cervical masses, normal ROM, no appreciable thyromegaly, no JVD Respiratory: Diminished to auscultation bilaterally with Bilateral wheezing and some rhonchi. Slightly increased respiratory effort. No accessory muscle use.  Cardiovascular: RRR, no murmurs / rubs / gallops. S1 and S2 auscultated. No extremity edema. Abdomen: Soft, non-tender, non-distended. No masses palpated. No appreciable hepatosplenomegaly. Bowel sounds positive.  GU: Deferred. Musculoskeletal: No clubbing / cyanosis of digits/nails. No joint deformity upper and lower extremities.  Skin: No rashes, lesions, ulcers on a limited skin evaluation. No induration; Warm and dry.  Neurologic: CN 2-12 grossly intact with no focal deficits. Romberg sign cerebellar  reflexes not assessed.  Psychiatric: Normal judgment and insight. Alert and oriented x 3. Normal mood and appropriate affect.   Data Reviewed: I have personally reviewed following labs and imaging studies  CBC:  Recent Labs Lab 09/19/16 0739 09/19/16 0750 09/20/16 0618  WBC 6.7  --  9.6  NEUTROABS 5.9  --   --   HGB 13.2 15.3 14.0  HCT 39.4 45.0 42.6  MCV 70.4*  --  71.5*  PLT 247  --  256   Basic Metabolic Panel:  Recent Labs Lab 09/19/16 0750 09/20/16 0618  NA 135 137  K 4.1 4.1  CL 97* 99*  CO2  --  30  GLUCOSE 137* 143*  BUN 8 10  CREATININE 0.50* 0.71  CALCIUM  --  9.3   GFR: Estimated Creatinine Clearance: 70.5 mL/min (by C-G formula based on SCr of 0.71 mg/dL). Liver Function Tests: No results for input(s): AST,  ALT, ALKPHOS, BILITOT, PROT, ALBUMIN in the last 168 hours. No results for input(s): LIPASE, AMYLASE in the last 168 hours. No results for input(s): AMMONIA in the last 168 hours. Coagulation Profile: No results for input(s): INR, PROTIME in the last 168 hours. Cardiac Enzymes: No results for input(s): CKTOTAL, CKMB, CKMBINDEX, TROPONINI in the last 168 hours. BNP (last 3 results) No results for input(s): PROBNP in the last 8760 hours. HbA1C: No results for input(s): HGBA1C in the last 72 hours. CBG: No results for input(s): GLUCAP in the last 168 hours. Lipid Profile: No results for input(s): CHOL, HDL, LDLCALC, TRIG, CHOLHDL, LDLDIRECT in the last 72 hours. Thyroid Function Tests: No results for input(s): TSH, T4TOTAL, FREET4, T3FREE, THYROIDAB in the last 72 hours. Anemia Panel: No results for input(s): VITAMINB12, FOLATE, FERRITIN, TIBC, IRON, RETICCTPCT in the last 72 hours. Sepsis Labs: No results for input(s): PROCALCITON, LATICACIDVEN in the last 168 hours.  No results found for this or any previous visit (from the past 240 hour(s)).   Radiology Studies: Ct Chest Wo Contrast  Result Date: 09/19/2016 CLINICAL DATA:  Followup  abnormal chest x-ray. Possible pulmonary nodule. History of emphysema. EXAM: CT CHEST WITHOUT CONTRAST TECHNIQUE: Multidetector CT imaging of the chest was performed following the standard protocol without IV contrast. COMPARISON:  Chest x-ray 09/19/2016 FINDINGS: Cardiovascular: The heart is normal in size. No pericardial effusion. No pericardial effusion. Mild tortuosity, ectasia and calcification of the thoracic aorta and branch vessels. Coronary artery calcifications are noted. Mediastinum/Nodes: No mediastinal or hilar mass or lymphadenopathy. The esophagus is grossly normal. Lungs/Pleura: Advanced emphysematous changes involving the lungs with areas of pulmonary scarring. The right upper lobe density has the appearance of scarring change. No discrete mass or pulmonary lesion is identified. No worrisome pulmonary nodules and no acute overlying pulmonary process. No pleural effusion. Upper Abdomen: No significant upper abdominal findings. Chest wall/ Musculoskeletal: No chest wall mass, supraclavicular or axillary adenopathy. Small scattered lymph nodes are noted. The thyroid gland is grossly normal. No significant bony findings. IMPRESSION: 1. Advanced emphysematous changes and pulmonary scarring. The abnormality on the chest x-ray is consistent with scarring. No worrisome pulmonary lesions, masses or nodules. 2. No acute pulmonary findings. 3. Age advanced atherosclerotic calcifications involving the aorta and coronary arteries. 4. No mediastinal or hilar mass or adenopathy. Aortic Atherosclerosis (ICD10-I70.0) and Emphysema (ICD10-J43.9). Electronically Signed   By: Rudie Meyer M.D.   On: 09/19/2016 11:45   Scheduled Meds: . amLODipine  10 mg Oral Daily  . arformoterol  15 mcg Nebulization BID  . budesonide (PULMICORT) nebulizer solution  0.25 mg Nebulization BID  . enoxaparin (LOVENOX) injection  40 mg Subcutaneous Q24H  . feeding supplement  1 Container Oral TID BM  . guaiFENesin  600 mg Oral BID    . ipratropium-albuterol  3 mL Nebulization Q4H  . lisinopril  10 mg Oral Daily  . methylPREDNISolone (SOLU-MEDROL) injection  80 mg Intravenous Q12H  . nicotine  14 mg Transdermal Daily  . polyethylene glycol  17 g Oral Daily  . sodium chloride flush  3 mL Intravenous Q12H   Continuous Infusions: . levofloxacin (LEVAQUIN) IV Stopped (09/20/16 1139)    LOS: 2 days   Merlene Laughter, DO [facility] Pager [phone]  If 7PM-7AM, please contact night-coverage [email or web address] Password [facility] 09/21/2016, 7:58 AM

## 2016-09-22 ENCOUNTER — Inpatient Hospital Stay (HOSPITAL_COMMUNITY): Payer: Medicaid Other

## 2016-09-22 DIAGNOSIS — R0602 Shortness of breath: Secondary | ICD-10-CM

## 2016-09-22 LAB — CBC WITH DIFFERENTIAL/PLATELET
Basophils Absolute: 0 10*3/uL (ref 0.0–0.1)
Basophils Relative: 0 %
EOS ABS: 0 10*3/uL (ref 0.0–0.7)
EOS PCT: 0 %
HCT: 38.5 % — ABNORMAL LOW (ref 39.0–52.0)
Hemoglobin: 12.4 g/dL — ABNORMAL LOW (ref 13.0–17.0)
LYMPHS ABS: 0.7 10*3/uL (ref 0.7–4.0)
Lymphocytes Relative: 10 %
MCH: 23 pg — ABNORMAL LOW (ref 26.0–34.0)
MCHC: 32.2 g/dL (ref 30.0–36.0)
MCV: 71.3 fL — AB (ref 78.0–100.0)
MONO ABS: 0.5 10*3/uL (ref 0.1–1.0)
MONOS PCT: 7 %
Neutro Abs: 6.2 10*3/uL (ref 1.7–7.7)
Neutrophils Relative %: 83 %
PLATELETS: 227 10*3/uL (ref 150–400)
RBC: 5.4 MIL/uL (ref 4.22–5.81)
RDW: 15.9 % — AB (ref 11.5–15.5)
WBC: 7.4 10*3/uL (ref 4.0–10.5)

## 2016-09-22 LAB — COMPREHENSIVE METABOLIC PANEL
ALT: 15 U/L — ABNORMAL LOW (ref 17–63)
ANION GAP: 8 (ref 5–15)
AST: 18 U/L (ref 15–41)
Albumin: 3.3 g/dL — ABNORMAL LOW (ref 3.5–5.0)
Alkaline Phosphatase: 31 U/L — ABNORMAL LOW (ref 38–126)
BUN: 15 mg/dL (ref 6–20)
CHLORIDE: 100 mmol/L — AB (ref 101–111)
CO2: 30 mmol/L (ref 22–32)
Calcium: 8.8 mg/dL — ABNORMAL LOW (ref 8.9–10.3)
Creatinine, Ser: 0.56 mg/dL — ABNORMAL LOW (ref 0.61–1.24)
Glucose, Bld: 134 mg/dL — ABNORMAL HIGH (ref 65–99)
Potassium: 3.9 mmol/L (ref 3.5–5.1)
SODIUM: 138 mmol/L (ref 135–145)
Total Bilirubin: 0.3 mg/dL (ref 0.3–1.2)
Total Protein: 6 g/dL — ABNORMAL LOW (ref 6.5–8.1)

## 2016-09-22 LAB — PHOSPHORUS: PHOSPHORUS: 3.9 mg/dL (ref 2.5–4.6)

## 2016-09-22 LAB — MAGNESIUM: MAGNESIUM: 2.3 mg/dL (ref 1.7–2.4)

## 2016-09-22 MED ORDER — IOPAMIDOL (ISOVUE-370) INJECTION 76%
INTRAVENOUS | Status: AC
  Filled 2016-09-22: qty 100

## 2016-09-22 MED ORDER — IOPAMIDOL (ISOVUE-370) INJECTION 76%
100.0000 mL | Freq: Once | INTRAVENOUS | Status: AC | PRN
  Administered 2016-09-22: 100 mL via INTRAVENOUS

## 2016-09-22 NOTE — Progress Notes (Signed)
PROGRESS NOTE    Matthew Morrison  JXB:[id number] DOB: 08/23/1955 DOA: 09/19/2016 PCP: Patient, No Pcp Per   Brief Narrative:  Matthew Morrison is a 61 y.o. male with a past medical history of Emphysema, Hypertension, Arthritis, HLD and other comorbids who moved to [place] in March from [place]. He has been out of his medications for a few months. He's had 2 visits to the emergency department for shortness of breath over the last 1 month. He was in his usual state of health about a couple days ago when he started developing shortness of breath with wheezing. Has had a cough. It has been dry. Denies any fever. No nausea, vomiting. Has had some chest tightness, but mainly with coughing. Symptoms are not getting better despite treatment at home with nebulizer treatments and so he decided to come into the hospital. In the emergency department, patient was given a breathing treatments and admitted for Hypoxia that was refractory to the Steroid Neb Treatments given in the ED. He is slowly improving and still feels SOB so a CT Chest Angio was ordered to r/o PE as patient was still tachycardic.   Assessment & Plan:   Principal Problem:   COPD with acute exacerbation (HCC) Active Problems:   Acute respiratory failure with hypoxia (HCC)   Emphysema of lung (HCC)   Essential hypertension   Hypercholesterolemia   COPD exacerbation (HCC)  Acute Respiratory Failure with Hypoxia likely 2/2 to Severe COPD -Patient desaturated to 84% on Room Air while Ambulating -C/w Supplemental O2 and Wean As tolerated -C/w Continuous Pulse Oximetry and Maintain O2 Saturations >92% -Repeat CXR in AM -Obtaining CT Chest Angio to rule out Pulmonary Embolus -May need Pulmonary Consultation in AM if still not improving   COPD with Acute exacerbation [facility]):  -Patient continues to have shortness of breath when talking, moving and changing positions. He is on 2L of oxygen and saturation is in the mid 90's on rest and needs increased  O2 and 3-4 Liters ambulating.  -Continue Supplemental O2, DuoNeb 3 mL q4h and Albuterol 2.5 mg Neb q2hprn for wheezing, Levofloxacin 500 mg po Daily, and with Methylprednisolone 80 mg IV q12h -C/w Arformoterol Neb 15 mcg and Budesonide 0.25 mg Neb RT BID -Continue Guaifenesin 600 mg po BID -Decrease methylprednisolone to BID dosing 6/19 and will keep BID dosing today as patient is still wheezing . -Added Flutter Valve and NaCl Hypertonic Nebs 4 mL Daily -C/w Incentive Spirometry and added Hydroxazine 10 mg po TIDprn for anxiety associated with breathing  -Checked Respiratory Virus Panel and was Negative and patient is no longer on Droplet Precautions  Emphysema of lung (HCC) -CXR indicated some changes in the right upper lobe and a CT was ordered.  -CT Results showed scarring of the right upper lobe and emphysema of the lungs. No nodules or pneumonia suspected.  -As above; will bet repeating the Chest CT and doing it with Contrast to r/o PE  Urinary Urgency -For 1 week patient has been experiencing the need to urinate but voiding only a small amount. Denies other urinary symptoms.  -PSA was normal and Urinalysis Negative.  -Improved  Changes in bowel habits:  -Patient has been experiencing the need to void but states that for about a week, "only water comes out" and when he looks in the toilet, the discharge appears similar to mucous. States he had 2 normal bowel movements in the last week. Suspect there may be some constipation.  -Started Miralax 17 grams daily and patient  states stools are more formed but loose -Continue to Monitor.  Essential Hypertension:  -Patient had run out of home BP meds as well, admission BP was 179/128, trending down and now 119/79 -C/w Home Lisinopril 10 mg po Daily and Amlodipine 10 mg po Daily  Moderate Malnutrition in the Context of Chronic illness -Nutritionist Consulted and Recc's Appreciated -C/w Boost Breeze 1 Container po TID  Tobacco  Abuse/Smoker -Smoking Cessation Counseling given -Nicotine 14 mg TD q24h  DVT prophylaxis: Enoxaparin 40 mg sq q24h Code Status: FULL CODE Family Communication: No family present at bedside Disposition Plan: Home when medically stable as PT/OT recommend no Follow Up.   Consultants:   None   Procedures: None   Antimicrobials:  Anti-infectives    Start     Dose/Rate Route Frequency Ordered Stop   09/21/16 1000  levofloxacin (LEVAQUIN) tablet 500 mg     500 mg Oral Daily 09/21/16 0930     09/19/16 1100  levofloxacin (LEVAQUIN) IVPB 500 mg  Status:  Discontinued     500 mg 100 mL/hr over 60 Minutes Intravenous Every 24 hours 09/19/16 1004 09/21/16 0930     Subjective: Seen and examined at bedside and he stated he was having a hard time recovering after he would ambulate and stated he would get very anxious with his breathing. States he feels tight after his breathing treatments. No nausea or vomiting. Denied any other concerns or complaints.    Objective: Vitals:   09/21/16 2100 09/22/16 0044 09/22/16 0322 09/22/16 0500  BP:    108/78  Pulse:    (!) 111  Resp:    (!) 21  Temp:    97.5 F (36.4 C)  TempSrc:    Oral  SpO2: 100% 100% 100% 100%  Weight:      Height:        Intake/Output Summary (Last 24 hours) at 09/22/16 0749 Last data filed at 09/22/16 0600  Gross per 24 hour  Intake              200 ml  Output                0 ml  Net              200 ml   Filed Weights   09/19/16 1014  Weight: 49.5 kg (109 lb 2 oz)   Examination: Physical Exam:  Constitutional: Pleasant thin AAM in mild respiratory distress  Eyes: Sclerae anicteric, Lids normal ENMT: Mucous Membranes appear moist. Grossly normal hearing. External Ears and nose appear normal Neck: Supple with no visible JVD. Respiratory: Severely diminished with expiratory wheezing and some rhonchi. Patient was a little tachypenic but not using any accessory muscles to breathe wearing supplemental O2.    Cardiovascular: Tachycardic Rate but regular rhythm. S1 S2 appreciated. No lower extremity edema Abdomen: Soft, NT, ND. Bowel sounds present.  GU: Deferred Musculoskeletal: No contractures. No cyanosis Skin: Warm and Dry. No rashes or lesions appreciated on a limited skin eval Neurologic: CN 2-12 grossly intact. No focal deficits Psychiatric: Pleasant mood and affect. Intact judgment and insight  Data Reviewed: I have personally reviewed following labs and imaging studies  CBC:  Recent Labs Lab 09/19/16 0739 09/19/16 0750 09/20/16 0618 09/22/16 0539  WBC 6.7  --  9.6 7.4  NEUTROABS 5.9  --   --  6.2  HGB 13.2 15.3 14.0 12.4*  HCT 39.4 45.0 42.6 38.5*  MCV 70.4*  --  71.5* 71.3*  PLT 247  --  256 227   Basic Metabolic Panel:  Recent Labs Lab 09/19/16 0750 09/20/16 0618 09/22/16 0539  NA 135 137 138  K 4.1 4.1 3.9  CL 97* 99* 100*  CO2  --  30 30  GLUCOSE 137* 143* 134*  BUN 8 10 15   CREATININE 0.50* 0.71 0.56*  CALCIUM  --  9.3 8.8*  MG  --   --  2.3  PHOS  --   --  3.9   GFR: Estimated Creatinine Clearance: 70.5 mL/min (A) (by C-G formula based on SCr of 0.56 mg/dL (L)). Liver Function Tests:  Recent Labs Lab 09/22/16 0539  AST 18  ALT 15*  ALKPHOS 31*  BILITOT 0.3  PROT 6.0*  ALBUMIN 3.3*   No results for input(s): LIPASE, AMYLASE in the last 168 hours. No results for input(s): AMMONIA in the last 168 hours. Coagulation Profile: No results for input(s): INR, PROTIME in the last 168 hours. Cardiac Enzymes: No results for input(s): CKTOTAL, CKMB, CKMBINDEX, TROPONINI in the last 168 hours. BNP (last 3 results) No results for input(s): PROBNP in the last 8760 hours. HbA1C: No results for input(s): HGBA1C in the last 72 hours. CBG: No results for input(s): GLUCAP in the last 168 hours. Lipid Profile: No results for input(s): CHOL, HDL, LDLCALC, TRIG, CHOLHDL, LDLDIRECT in the last 72 hours. Thyroid Function Tests: No results for input(s): TSH,  T4TOTAL, FREET4, T3FREE, THYROIDAB in the last 72 hours. Anemia Panel: No results for input(s): VITAMINB12, FOLATE, FERRITIN, TIBC, IRON, RETICCTPCT in the last 72 hours. Sepsis Labs: No results for input(s): PROCALCITON, LATICACIDVEN in the last 168 hours.  Recent Results (from the past 240 hour(s))  Respiratory Panel by PCR     Status: None   Collection Time: 09/21/16  1:00 PM  Result Value Ref Range Status   Adenovirus NOT DETECTED NOT DETECTED Final   Coronavirus 229E NOT DETECTED NOT DETECTED Final   Coronavirus HKU1 NOT DETECTED NOT DETECTED Final   Coronavirus NL63 NOT DETECTED NOT DETECTED Final   Coronavirus OC43 NOT DETECTED NOT DETECTED Final   Metapneumovirus NOT DETECTED NOT DETECTED Final   Rhinovirus / Enterovirus NOT DETECTED NOT DETECTED Final   Influenza A NOT DETECTED NOT DETECTED Final   Influenza B NOT DETECTED NOT DETECTED Final   Parainfluenza Virus 1 NOT DETECTED NOT DETECTED Final   Parainfluenza Virus 2 NOT DETECTED NOT DETECTED Final   Parainfluenza Virus 3 NOT DETECTED NOT DETECTED Final   Parainfluenza Virus 4 NOT DETECTED NOT DETECTED Final   Respiratory Syncytial Virus NOT DETECTED NOT DETECTED Final   Bordetella pertussis NOT DETECTED NOT DETECTED Final   Chlamydophila pneumoniae NOT DETECTED NOT DETECTED Final   Mycoplasma pneumoniae NOT DETECTED NOT DETECTED Final    Comment: Performed at [facility], [address] [place], [place] [address]    Radiology Studies: No results found. Scheduled Meds: . amLODipine  10 mg Oral Daily  . arformoterol  15 mcg Nebulization BID  . budesonide (PULMICORT) nebulizer solution  0.25 mg Nebulization BID  . enoxaparin (LOVENOX) injection  40 mg Subcutaneous Q24H  . feeding supplement  1 Container Oral TID BM  . guaiFENesin  600 mg Oral BID  . ipratropium-albuterol  3 mL Nebulization Q4H  . levofloxacin  500 mg Oral Daily  . lisinopril  10 mg Oral Daily  . methylPREDNISolone (SOLU-MEDROL) injection  80  mg Intravenous Q12H  . nicotine  14 mg Transdermal Daily  . polyethylene glycol  17 g Oral Daily  .  sodium chloride flush  3 mL Intravenous Q12H  . sodium chloride HYPERTONIC  4 mL Nebulization Daily   Continuous Infusions:   LOS: 3 days   Merlene Laughter, DO [facility] Pager [phone]  If 7PM-7AM, please contact night-coverage [email or web address] Password [facility] 09/22/2016, 7:49 AM

## 2016-09-22 NOTE — Evaluation (Signed)
Occupational Therapy Evaluation Patient Details Name: Matthew Morrison MRN: [id number] DOB: [birth year] Today's Date: 09/22/2016    History of Present Illness 61 y/o admitted with acute exacerbation of COPD with SOB, wheezing and hypoxia. PMH: COPD, HTN   Clinical Impression   Pt admitted with COPD exacerbation. Pt currently with functional limitations due to the deficits listed below (see OT Problem List). Pt will benefit from skilled OT to increase their safety and independence with ADL and functional mobility for ADL to facilitate discharge to venue listed below.      Follow Up Recommendations  No OT follow up    Equipment Recommendations  Tub/shower seat       Precautions / Restrictions Precautions Precaution Comments: monitor o2      Mobility Bed Mobility               General bed mobility comments: sitting up in chair  Transfers Overall transfer level: Needs assistance   Transfers: Sit to/from Stand;Stand Pivot Transfers Sit to Stand: Min assist Stand pivot transfers: Min assist                ADL either performed or assessed with clinical judgement   ADL Overall ADL's : Needs assistance/impaired Eating/Feeding: Independent;Sitting   Grooming: Minimal assistance;Sitting;Cueing for safety;Wash/dry face Grooming Details (indicate cue type and reason): rest breaks as needed Upper Body Bathing: Minimal assistance;Sitting   Lower Body Bathing: Moderate assistance;Sit to/from stand;Cueing for sequencing;Cueing for safety Lower Body Bathing Details (indicate cue type and reason): would benefit from AE Upper Body Dressing : Minimal assistance;Sitting   Lower Body Dressing: Moderate assistance;Cueing for safety;Cueing for sequencing   Toilet Transfer: Minimal assistance;BSC   Toileting- Clothing Manipulation and Hygiene: Minimal assistance;Sit to/from stand;Cueing for sequencing;Cueing for safety       Functional mobility during ADLs: Moderate  assistance;Cueing for safety General ADL Comments: pt will benefit from energy conservation as pt did share with OT ADL activity challenging due to lack of energy     Vision Patient Visual Report: No change from baseline       Perception     Praxis      Pertinent Vitals/Pain Pain Assessment: No/denies pain     Hand Dominance     Extremity/Trunk Assessment Upper Extremity Assessment Upper Extremity Assessment: Generalized weakness           Communication Communication Communication: No difficulties                 Home Living Family/patient expects to be discharged to:: Private residence Living Arrangements: Other relatives (brother) Available Help at Discharge: Family Type of Home: Apartment Home Access: Stairs to enter Secretary/administrator of Steps: 3 Entrance Stairs-Rails: None Home Layout: One level     Bathroom Shower/Tub: Chief Strategy Officer: Standard     Home Equipment: None   Additional Comments: reports was on o2 until 2012, but not since then.      Prior Functioning/Environment Level of Independence: Independent                 OT Problem List: Decreased strength;Decreased knowledge of use of DME or AE;Decreased knowledge of precautions      OT Treatment/Interventions: Self-care/ADL training;Patient/family education;DME and/or AE instruction;Energy conservation    OT Goals(Current goals can be found in the care plan section) Acute Rehab OT Goals Patient Stated Goal: breathe better OT Goal Formulation: With patient Time For Goal Achievement: 09/29/16  OT Frequency: Min 2X/week   Barriers to Morrison/C:  AM-PAC PT "6 Clicks" Daily Activity     Outcome Measure Help from another person eating meals?: None Help from another person taking care of personal grooming?: A Little Help from another person toileting, which includes using toliet, bedpan, or urinal?: A Little Help from another person bathing  (including washing, rinsing, drying)?: A Little Help from another person to put on and taking off regular upper body clothing?: A Little Help from another person to put on and taking off regular lower body clothing?: A Little 6 Click Score: 19   End of Session Nurse Communication: Mobility status  Activity Tolerance: Patient tolerated treatment well Patient left: in chair;with nursing/sitter in room  OT Visit Diagnosis: Unsteadiness on feet (R26.81)                Time: 4098-11911358-1423 OT Time Calculation (min): 25 min Charges:  OT General Charges $OT Visit: 1 Procedure OT Evaluation $OT Eval Moderate Complexity: 1 Procedure OT Treatments $Self Care/Home Management : 8-22 mins G-Codes:     Matthew Morrison, OT [phone]  Matthew CrowEDDING, Matthew Morrison 09/22/2016, 2:36 PM

## 2016-09-22 NOTE — Care Management Note (Signed)
Case Management Note  Patient Details  Name: Matthew SpeedDana Oestreicher MRN: [id number] Date of Birth: 12-Jun-1955  Subjective/Objective:   Admitted with COPD exacerbation                 Action/Plan:Plan to discharge home.   Expected Discharge Date:   (unknown)               Expected Discharge Plan:  Home/Self Care  In-House Referral:     Discharge planning Services  CM Consult, Follow-up appt scheduled  Post Acute Care Choice:    Choice offered to:     DME Arranged:    DME Agency:     HH Arranged:    HH Agency:     Status of Service:  In process, will continue to follow  If discussed at Long Length of Stay Meetings, dates discussed:    Additional CommentsGeni Bers:  Demitrious Mccannon, RN 09/22/2016, 3:29 PM

## 2016-09-22 NOTE — Evaluation (Signed)
Physical Therapy Evaluation Patient Details Name: Matthew Morrison MRN: [id number] DOB: 28-Sep-1955 Today's Date: 09/22/2016   History of Present Illness  61 y/o admitted with acute exacerbation of COPD with SOB, wheezing and hypoxia. PMH: COPD, HTN  Clinical Impression  Pt admitted with above diagnosis. Pt currently with functional limitations due to the deficits listed below (see PT Problem List).  Pt will benefit from skilled PT to increase their independence and safety with mobility to allow discharge to the venue listed below.  Pt de-sat on 2 L/min to 84% with gait, which increased to 89% on 3 L/min. Pt ambulates with S with guarded gait. Pt educated on pursed lip breathing throughout session.      Follow Up Recommendations No PT follow up;Supervision - Intermittent    Equipment Recommendations  None recommended by PT    Recommendations for Other Services       Precautions / Restrictions Precautions Precaution Comments: monitor o2      Mobility  Bed Mobility               General bed mobility comments: sitting EOB upon arrival  Transfers Overall transfer level: Needs assistance   Transfers: Sit to/from Stand Sit to Stand: Supervision         General transfer comment: S for safety  Ambulation/Gait Ambulation/Gait assistance: Supervision Ambulation Distance (Feet): 95 Feet Assistive device: None   Gait velocity: decreased   General Gait Details: amb on 2 L/min with o2 dropping to 84%, increased to 3 L/min and o2 increased to 89%.  Pt with guarded gait, possibly due to anxiety (?)  Stairs            Wheelchair Mobility    Modified Rankin (Stroke Patients Only)       Balance Overall balance assessment: No apparent balance deficits (not formally assessed)                                           Pertinent Vitals/Pain Pain Assessment: No/denies pain    Home Living Family/patient expects to be discharged to:: Private  residence Living Arrangements: Other relatives (brother) Available Help at Discharge: Family Type of Home: Apartment Home Access: Stairs to enter Entrance Stairs-Rails: None Entrance Stairs-Number of Steps: 3 Home Layout: One level Home Equipment: None Additional Comments: reports was on o2 until 2012, but not since then.    Prior Function Level of Independence: Independent               Hand Dominance        Extremity/Trunk Assessment   Upper Extremity Assessment Upper Extremity Assessment: Defer to OT evaluation    Lower Extremity Assessment Lower Extremity Assessment: Overall WFL for tasks assessed    Cervical / Trunk Assessment Cervical / Trunk Assessment: Normal  Communication   Communication: No difficulties  Cognition Arousal/Alertness: Awake/alert Behavior During Therapy: WFL for tasks assessed/performed;Anxious                                   General Comments: Pt reports feeling anxious regarding SOB and current medical condition.  Had medication for anxiety prior to session.      General Comments General comments (skin integrity, edema, etc.): pt left on 2 L/min o2 with o2 sat in upper 90's at rest    Exercises  Assessment/Plan    PT Assessment Patient needs continued PT services  PT Problem List Decreased activity tolerance;Cardiopulmonary status limiting activity;Decreased mobility       PT Treatment Interventions Gait training;Stair training;Functional mobility training;Therapeutic activities;Therapeutic exercise;Patient/family education    PT Goals (Current goals can be found in the Care Plan section)  Acute Rehab PT Goals Patient Stated Goal: breathe better PT Goal Formulation: With patient Time For Goal Achievement: 09/29/16 Potential to Achieve Goals: Good    Frequency Min 3X/week   Barriers to discharge        Co-evaluation               AM-PAC PT "6 Clicks" Daily Activity  Outcome Measure  Difficulty turning over in bed (including adjusting bedclothes, sheets and blankets)?: None Difficulty moving from lying on back to sitting on the side of the bed? : None Difficulty sitting down on and standing up from a chair with arms (e.g., wheelchair, bedside commode, etc,.)?: A Little Help needed moving to and from a bed to chair (including a wheelchair)?: A Little Help needed walking in hospital room?: A Little Help needed climbing 3-5 steps with a railing? : A Little 6 Click Score: 20    End of Session Equipment Utilized During Treatment: Gait belt;Oxygen Activity Tolerance: Other (comment) (de-sat) Patient left: in chair;with call bell/phone within reach Nurse Communication: Mobility status PT Visit Diagnosis: Difficulty in walking, not elsewhere classified (R26.2)    Time: [id number] PT Time Calculation (min) (ACUTE ONLY): 27 min   Charges:   PT Evaluation $PT Eval Low Complexity: 1 Procedure PT Treatments $Gait Training: 8-22 mins   PT G Codes:        Coal Nearhood L. Katrinka BlazingSmith, [place]PT Pager [phone] 09/22/2016   Enzo MontgomeryKaren L Anelis Hrivnak 09/22/2016, 9:31 AM

## 2016-09-23 DIAGNOSIS — R05 Cough: Secondary | ICD-10-CM

## 2016-09-23 DIAGNOSIS — J441 Chronic obstructive pulmonary disease with (acute) exacerbation: Principal | ICD-10-CM

## 2016-09-23 DIAGNOSIS — T464X5A Adverse effect of angiotensin-converting-enzyme inhibitors, initial encounter: Secondary | ICD-10-CM

## 2016-09-23 LAB — CBC WITH DIFFERENTIAL/PLATELET
Basophils Absolute: 0 10*3/uL (ref 0.0–0.1)
Basophils Relative: 0 %
Eosinophils Absolute: 0 10*3/uL (ref 0.0–0.7)
Eosinophils Relative: 0 %
HEMATOCRIT: 40.5 % (ref 39.0–52.0)
HEMOGLOBIN: 13 g/dL (ref 13.0–17.0)
LYMPHS ABS: 0.8 10*3/uL (ref 0.7–4.0)
LYMPHS PCT: 11 %
MCH: 23.3 pg — AB (ref 26.0–34.0)
MCHC: 32.1 g/dL (ref 30.0–36.0)
MCV: 72.5 fL — AB (ref 78.0–100.0)
MONO ABS: 0.3 10*3/uL (ref 0.1–1.0)
MONOS PCT: 4 %
NEUTROS ABS: 6.1 10*3/uL (ref 1.7–7.7)
NEUTROS PCT: 85 %
Platelets: 245 10*3/uL (ref 150–400)
RBC: 5.59 MIL/uL (ref 4.22–5.81)
RDW: 15.8 % — ABNORMAL HIGH (ref 11.5–15.5)
WBC: 7.1 10*3/uL (ref 4.0–10.5)

## 2016-09-23 LAB — COMPREHENSIVE METABOLIC PANEL
ALBUMIN: 3.3 g/dL — AB (ref 3.5–5.0)
ALT: 16 U/L — ABNORMAL LOW (ref 17–63)
ANION GAP: 9 (ref 5–15)
AST: 18 U/L (ref 15–41)
Alkaline Phosphatase: 35 U/L — ABNORMAL LOW (ref 38–126)
BILIRUBIN TOTAL: 0.5 mg/dL (ref 0.3–1.2)
BUN: 20 mg/dL (ref 6–20)
CALCIUM: 8.8 mg/dL — AB (ref 8.9–10.3)
CO2: 31 mmol/L (ref 22–32)
Chloride: 98 mmol/L — ABNORMAL LOW (ref 101–111)
Creatinine, Ser: 0.58 mg/dL — ABNORMAL LOW (ref 0.61–1.24)
GFR calc Af Amer: >60 mL/min (ref >60)
GLUCOSE: 169 mg/dL — AB (ref 65–99)
Potassium: 3.8 mmol/L (ref 3.5–5.1)
Sodium: 138 mmol/L (ref 135–145)
TOTAL PROTEIN: 6.2 g/dL — AB (ref 6.5–8.1)

## 2016-09-23 LAB — PHOSPHORUS: PHOSPHORUS: 4.2 mg/dL (ref 2.5–4.6)

## 2016-09-23 LAB — MAGNESIUM: Magnesium: 2.2 mg/dL (ref 1.7–2.4)

## 2016-09-23 MED ORDER — IRBESARTAN 75 MG PO TABS
37.5000 mg | ORAL_TABLET | Freq: Every day | ORAL | Status: DC
  Administered 2016-09-23 – 2016-09-26 (×4): 37.5 mg via ORAL
  Filled 2016-09-23 (×4): qty 0.5

## 2016-09-23 MED ORDER — ALBUTEROL SULFATE (2.5 MG/3ML) 0.083% IN NEBU
2.5000 mg | INHALATION_SOLUTION | Freq: Four times a day (QID) | RESPIRATORY_TRACT | Status: DC
  Administered 2016-09-23 – 2016-09-24 (×5): 2.5 mg via RESPIRATORY_TRACT
  Filled 2016-09-23 (×5): qty 3

## 2016-09-23 MED ORDER — METHYLPREDNISOLONE SODIUM SUCC 40 MG IJ SOLR
40.0000 mg | Freq: Four times a day (QID) | INTRAMUSCULAR | Status: DC
  Administered 2016-09-23 – 2016-09-24 (×3): 40 mg via INTRAVENOUS
  Filled 2016-09-23 (×3): qty 1

## 2016-09-23 MED ORDER — BUDESONIDE 0.5 MG/2ML IN SUSP
0.5000 mg | Freq: Two times a day (BID) | RESPIRATORY_TRACT | Status: DC
  Administered 2016-09-23 – 2016-09-26 (×6): 0.5 mg via RESPIRATORY_TRACT
  Filled 2016-09-23 (×6): qty 2

## 2016-09-23 MED ORDER — UMECLIDINIUM BROMIDE 62.5 MCG/INH IN AEPB
1.0000 | INHALATION_SPRAY | Freq: Every day | RESPIRATORY_TRACT | Status: DC
  Filled 2016-09-23: qty 7

## 2016-09-23 MED ORDER — PANTOPRAZOLE SODIUM 40 MG PO TBEC
40.0000 mg | DELAYED_RELEASE_TABLET | Freq: Every day | ORAL | Status: DC
  Administered 2016-09-23: 40 mg via ORAL
  Filled 2016-09-23: qty 1

## 2016-09-23 NOTE — Progress Notes (Signed)
Occupational Therapy Treatment Patient Details Name: Matthew Morrison MRN: [id number] DOB: 1955/12/04 Today's Date: 09/23/2016    History of present illness 61 y/o admitted with acute exacerbation of COPD with SOB, wheezing and hypoxia. PMH: COPD, HTN   OT comments  Ae issued. Educated and pt able to return demonstate sock aid and reacher. Pt feels this will help him save energy and be more I .  Follow Up Recommendations  No OT follow up    Equipment Recommendations  Tub/shower seat       Precautions / Restrictions Precautions Precautions: Other (comment) Precaution Comments: monitor O2       Mobility Bed Mobility Overal bed mobility: Modified Independent                Transfers Overall transfer level: Needs assistance   Transfers: Sit to/from Stand Sit to Stand: Supervision              Balance              no deficits noted - not formally assessed                             ADL either performed or assessed with clinical judgement   ADL                                         General ADL Comments: AE issued (medicaid).  pt very thankful and able to demonstrate correct use.       Vision              Cognition Arousal/Alertness: Awake/alert Behavior During Therapy: WFL for tasks assessed/performed Overall Cognitive Status: Within Functional Limits for tasks assessed                                                     Pertinent Vitals/ Pain       Pain Assessment: No/denies pain         Frequency  Min 2X/week        Progress Toward Goals  OT Goals(current goals can now be found in the care plan section)  Progress towards OT goals: Progressing toward goals     Plan Discharge plan remains appropriate       AM-PAC PT "6 Clicks" Daily Activity     Outcome Measure   Help from another person eating meals?: None Help from another person taking care of personal grooming?: A  Little Help from another person toileting, which includes using toliet, bedpan, or urinal?: A Little Help from another person bathing (including washing, rinsing, drying)?: A Little Help from another person to put on and taking off regular upper body clothing?: A Little Help from another person to put on and taking off regular lower body clothing?: A Little 6 Click Score: 19    End of Session    OT Visit Diagnosis: Unsteadiness on feet (R26.81)   Activity Tolerance Patient limited by fatigue   Patient Left in bed;with call bell/phone within reach   Nurse Communication Mobility status        Time: 1325-1336 OT Time Calculation (min): 11 min  Charges: OT General Charges $OT Visit: 1 Procedure OT Treatments $Self  Care/Home Management : 8-22 mins  Aetna EstatesLori Freddi Schrager, [place]OT [phone]   Alba CoryREDDING, Sereen Schaff D 09/23/2016, 1:43 PM

## 2016-09-23 NOTE — Consult Note (Signed)
PCCM Consult Note  Admission date: 09/19/2016 Consult date: 09/23/2016 Referring provider: Dr. Marland Mcalpine, [facility]  CC: Short of breath  HPI: 61 yo male smoker presented with dyspnea, cough, and wheezing for 2 days.  He was living in [place] and moved to [place] in March 2018.  He has hx of COPD with emphysema.  He had been out of his medications after moving.  He had pneumonia about 8 years ago.  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  No history of tuberculosis.  He quit smoking years ago.  Denies illicit drug use.    He is still having congestion in his upper chest and throat associated with wheezing.  Denies sinus congestion, post nasal drip, or reflux.  No chest pain or leg swelling.  Breathing improved some since admission, but not back to baseline.  He was on 2 liters oxygen previously, but this was not continued after he moved to [place].  He  has a past medical history of Asthma; Emphysema lung (HCC); and Hypertension.  He  has a past surgical history that includes Hernia repair.  His family history includes Depression in his mother; Stroke in his father.  He  reports that he has been smoking Cigarettes.  He has been smoking about 0.25 packs per day. He has never used smokeless tobacco. He reports that he drinks alcohol.  Allergies  Allergen Reactions  . Norflex [Orphenadrine] Hives    No current facility-administered medications on file prior to encounter.    Current Outpatient Prescriptions on File Prior to Encounter  Medication Sig  . albuterol (PROVENTIL) (2.5 MG/3ML) 0.083% nebulizer solution Take 3 mLs (2.5 mg total) by nebulization every 6 (six) hours as needed for wheezing or shortness of breath.  Marland Kitchen amLODipine (NORVASC) 5 MG tablet Take 1 tablet (5 mg total) by mouth daily.  Marland Kitchen ipratropium (ATROVENT) 0.02 % nebulizer solution Take 2.5 mLs (0.5 mg total) by nebulization every 4 (four) hours as needed for wheezing or shortness of breath.  . lisinopril (PRINIVIL,ZESTRIL) 10 MG  tablet Take 1 tablet (10 mg total) by mouth daily.  . predniSONE (DELTASONE) 20 MG tablet Take 2 tablets (40 mg total) by mouth daily.  . Fluticasone-Salmeterol (ADVAIR DISKUS) 250-50 MCG/DOSE AEPB Inhale 1 puff into the lungs 2 (two) times daily. (Patient not taking: Reported on 09/19/2016)    ROS: Constipation, difficulty passing urine.  Otherwise 12 point ROS negative.  Vital signs: BP 113/75 (BP Location: Right Arm)   Pulse (!) 101   Temp 97.9 F (36.6 C) (Oral)   Resp 20   Ht 5\' 4"  (1.626 m)   Wt 109 lb 2 oz (49.5 kg)   SpO2 99%   BMI 18.73 kg/m   Intake/output: I/O last 3 completed shifts: In: 1037 [P.O.:1037] Out: -   General: pleasant, wearing oxygen Neuro: CN intact, normal strength HEENT: Pupils reactive, no sinus tenderness, no oral exudate, faint "wheeze" over anterior neck, no LAN Cardiac: regular, no murmur Chest: decreased BS, prolonged exhalation Abd: soft, non tender, + bowel sounds Ext: no edema Skin: no rashes   CMP Latest Ref Rng & Units 09/23/2016 09/22/2016 09/20/2016  Glucose 65 - 99 mg/dL 161(W) 960(A) 540(J)  BUN 6 - 20 mg/dL 20 15 10   Creatinine 0.61 - 1.24 mg/dL 8.11(B) 1.47(W) 2.95  Sodium 135 - 145 mmol/L 138 138 137  Potassium 3.5 - 5.1 mmol/L 3.8 3.9 4.1  Chloride 101 - 111 mmol/L 98(L) 100(L) 99(L)  CO2 22 - 32 mmol/L 31 30  30  Calcium 8.9 - 10.3 mg/dL 6.9(G8.8(L) 2.9(B8.8(L) 9.3  Total Protein 6.5 - 8.1 g/dL 6.2(L) 6.0(L) -  Total Bilirubin 0.3 - 1.2 mg/dL 0.5 0.3 -  Alkaline Phos 38 - 126 U/L 35(L) 31(L) -  AST 15 - 41 U/L 18 18 -  ALT 17 - 63 U/L 16(L) 15(L) -     CBC Latest Ref Rng & Units 09/23/2016 09/22/2016 09/20/2016  WBC 4.0 - 10.5 K/uL 7.1 7.4 9.6  Hemoglobin 13.0 - 17.0 g/dL 28.413.0 12.4(L) 14.0  Hematocrit 39.0 - 52.0 % 40.5 38.5(L) 42.6  Platelets 150 - 400 K/uL 245 227 256     ABG    Component Value Date/Time   TCO2 29 09/19/2016 0750     CBG (last 3)  No results for input(s): GLUCAP in the last 72 hours.   Imaging: Ct  Angio Chest Pe W Or Wo Contrast  Result Date: 09/22/2016 CLINICAL DATA:  Shortness of breath EXAM: CT ANGIOGRAPHY CHEST WITH CONTRAST TECHNIQUE: Multidetector CT imaging of the chest was performed using the standard protocol during bolus administration of intravenous contrast. Multiplanar CT image reconstructions and MIPs were obtained to evaluate the vascular anatomy. CONTRAST:  100 mL Isovue 370 COMPARISON:  Chest CT 09/19/2016 FINDINGS: Cardiovascular: Contrast injection is sufficient to demonstrate satisfactory opacification of the pulmonary arteries to the segmental level. There is no pulmonary embolus. The main pulmonary artery is within normal limits for size. There is no CT evidence of acute right heart strain. There is calcific aortic atherosclerosis. There is a normal 3-vessel arch branching pattern. Heart size is normal, without pericardial effusion. There are coronary artery calcifications. Mediastinum/Nodes: No mediastinal, hilar or axillary lymphadenopathy. The visualized thyroid and thoracic esophageal course are unremarkable. Lungs/Pleura: There is severe emphysema bilaterally. No focal consolidation or pulmonary edema. No masses or pulmonary nodules. No pleural effusion. Multiple nodular opacities along the tracheal lumen. Upper Abdomen: Contrast bolus timing is not optimized for evaluation of the abdominal organs. Within this limitation, the visualized organs of the upper abdomen are normal. Musculoskeletal: No chest wall abnormality. No acute or significant osseous findings. Review of the MIP images confirms the above findings. IMPRESSION: 1. No pulmonary embolus or acute aortic syndrome. 2. Aortic Atherosclerosis (ICD10-I70.0) and severe Emphysema (ICD10-J43.9). 3. Nodular opacities along the lower tracheal lumen, probably secretions, given their absence on the recent study. Electronically Signed   By: Deatra RobinsonKevin  Herman M.D.   On: 09/22/2016 19:10     Studies: CT chest 09/19/16 >> advanced  emphysema, atherosclerosis, b/l scarring CT angio chest 09/22/16 >> no PE  Antibiotics: Levaquin 6/18 >>   Cultures: Respiratory viral panel 6/20 >> negative  Events: 6/18 Admit  Summary: 61 yo male former smoker with COPD exacerbation.  Has persistent upper airway wheeze, and has been on ACE inhibitor.  He has difficulty affording his medications.  Assessment/plan:  COPD exacerbation. Advanced COPD with emphysema. - change solumedrol to 40 mg q6h - pulmicort, incruse, albuterol - Day 5/7 levaquin  Acute on chronic hypoxic respiratory failure. - oxygen to keep SpO2 > 90%  ACE inhibitor cough. - change to ARB  Severe protein calorie malnutrition. - per primary team  DVT prophylaxis - lovenox SUP - not indicated Nutrition - regular diet Goals of care - full code.  Would benefit from palliative care assessment >> defer to primary team   Coralyn HellingVineet Deissy Guilbert, MD [facility] 09/23/2016, 12:08 PM Pager:  [phone] After 3pm call: [phone]

## 2016-09-23 NOTE — Progress Notes (Signed)
Physical Therapy Treatment Patient Details Name: Matthew Morrison MRN: [id number] DOB: September 23, 1955 Today's Date: 09/23/2016    History of Present Illness 61 y/o admitted with acute exacerbation of COPD with SOB, wheezing and hypoxia. PMH: COPD, HTN    PT Comments    Pt ambulated in hallway and required supplemental oxygen.  Pt educated to take frequent seated rest breaks to conserve energy.  Discussed saturation percents and pt's need to be on supplemental oxygen during mobility at this time.  Pt very pleasant and reports understanding.  Pt also interested in obtaining pulse oximeter for home use.  SATURATION QUALIFICATIONS: (This note is used to comply with regulatory documentation for home oxygen)  Patient Saturations on Room Air at Rest = 93%  Patient Saturations on Room Air while Ambulating = 86%  Patient Saturations on 2 Liters of oxygen while Ambulating = 90%  Please briefly explain why patient needs home oxygen: to improve oxygen saturations above 88% during physical activity such as ambulation.   Follow Up Recommendations  No PT follow up;Supervision - Intermittent     Equipment Recommendations  None recommended by PT    Recommendations for Other Services       Precautions / Restrictions Precautions Precautions: Other (comment) Precaution Comments: monitor O2    Mobility  Bed Mobility Overal bed mobility: Modified Independent                Transfers Overall transfer level: Needs assistance   Transfers: Sit to/from Stand Sit to Stand: Supervision            Ambulation/Gait Ambulation/Gait assistance: Supervision Ambulation Distance (Feet): 100 Feet Assistive device: None Gait Pattern/deviations: Step-through pattern;Decreased stride length Gait velocity: decreased   General Gait Details: pt pushed dynamap, required seated rest break after 50', Spo2 dropped to 86% on room air so applied 2L O2 [place] and SPO2 improved to 90%, pt fatigues very  quickly   Stairs            Wheelchair Mobility    Modified Rankin (Stroke Patients Only)       Balance                                            Cognition Arousal/Alertness: Awake/alert Behavior During Therapy: WFL for tasks assessed/performed Overall Cognitive Status: Within Functional Limits for tasks assessed                                        Exercises      General Comments        Pertinent Vitals/Pain Pain Assessment: No/denies pain    Home Living                      Prior Function            PT Goals (current goals can now be found in the care plan section) Progress towards PT goals: Progressing toward goals    Frequency    Min 3X/week      PT Plan Current plan remains appropriate    Co-evaluation              AM-PAC PT "6 Clicks" Daily Activity  Outcome Measure  Difficulty turning over in bed (including adjusting bedclothes, sheets and blankets)?: None Difficulty moving  from lying on back to sitting on the side of the bed? : None Difficulty sitting down on and standing up from a chair with arms (e.g., wheelchair, bedside commode, etc,.)?: A Little Help needed moving to and from a bed to chair (including a wheelchair)?: A Little Help needed walking in hospital room?: A Little Help needed climbing 3-5 steps with a railing? : A Little 6 Click Score: 20    End of Session Equipment Utilized During Treatment: Oxygen Activity Tolerance: Patient limited by fatigue Patient left: with call bell/phone within reach;in bed Nurse Communication: Mobility status PT Visit Diagnosis: Difficulty in walking, not elsewhere classified (R26.2)     Time: 1610-9604 PT Time Calculation (min) (ACUTE ONLY): 17 min  Charges:  $Gait Training: 8-22 mins                    G Codes:      Zenovia Jarred, PT, DPT 09/23/2016 Pager: [phone]   Matthew Morrison E 09/23/2016, 12:57 PM

## 2016-09-23 NOTE — Progress Notes (Signed)
[facility]ointment [facility] July 2 at 10 AM. Pt is aware.

## 2016-09-23 NOTE — Progress Notes (Signed)
PROGRESS NOTE    Matthew Morrison  WUJ:[id number] DOB: 1955-12-31 DOA: 09/19/2016 PCP: Patient, No Pcp Per   Brief Narrative:  Matthew Morrison is a 61 y.o. male with a past medical history of Emphysema, Hypertension, Arthritis, HLD and other comorbids who moved to [place] in March from [place]. He has been out of his medications for a few months. He's had 2 visits to the emergency department for shortness of breath over the last 1 month. He was in his usual state of health about a couple days ago when he started developing shortness of breath with wheezing. Has had a cough. It has been dry. Denies any fever. No nausea, vomiting. Has had some chest tightness, but mainly with coughing. Symptoms are not getting better despite treatment at home with nebulizer treatments and so he decided to come into the hospital. In the emergency department, patient was given a breathing treatments and admitted for Hypoxia that was refractory to the Steroid Neb Treatments given in the ED. He is slowly improving and still feels SOB so a CT Chest Angio was ordered to r/o PE as patient was still tachycardic and showed no PE. Patient was slowly improving so Pulmonary was consulted for further management and evaluation for additional recommendations.   Assessment & Plan:   Principal Problem:   COPD with acute exacerbation (HCC) Active Problems:   Acute respiratory failure with hypoxia (HCC)   Emphysema of lung (HCC)   Essential hypertension   Hypercholesterolemia   COPD exacerbation (HCC)  Acute Respiratory Failure with Hypoxia likely 2/2 to Severe COPD -Patient desaturated to 84% on Room Air while Ambulating -C/w Supplemental O2 and Wean As tolerated -C/w Continuous Pulse Oximetry and Maintain O2 Saturations >92% -Repeat CXR in AM -Obtained CT Chest Angio to rule out Pulmonary Embolus and did not show any PE -Pulmonary Consulted and Appreciate Dr. Evlyn Courier recommendations -Walk Screen qualified patient for O2   COPD with  Acute exacerbation [facility]):  -Patient continues to have shortness of breath when talking, moving and changing positions. He is on 2L of oxygen and saturation is in the mid 90's on rest and needs increased O2 and 3-4 Liters ambulating.  -Continue Supplemental O2, DuoNeb 3 mL q4h and Albuterol 2.5 mg Neb q2hprn for wheezing, Levofloxacin 500 mg po Daily (Day 5/7) -Methylprednisolone 80 mg IV q12h changed to Methylprednisolone 40 mg IV q6h by Pulmonary -C/w Arformoterol Neb 15 mcg and Budesonide 0.25 mg Neb RT BID -Continue Guaifenesin 600 mg po BID -Added Flutter Valve -NaCl Hypertonic Nebs 4 mL Daily discontinued  -C/w Incentive Spirometry and added Hydroxazine 10 mg po TIDprn for anxiety associated with breathing  -Pulmonary Added Umeclidinium Bromide 1 puff IH Daily  -Checked Respiratory Virus Panel and was Negative and patient is no longer on Droplet Precautions -Chest CT Angio for PE was Negative for PE -Consulted Pulmonary for further Recc's and appreciated Recc's -Pulmonary stating patient may benefit from Palliative Care Assessment   Emphysema of lung (HCC) -CXR indicated some changes in the right upper lobe and a CT was ordered.  -CT Results showed scarring of the right upper lobe and emphysema of the lungs. No nodules or pneumonia suspected.  -As above;  -Repeated the Chest CT and doing it with Contrast to r/o PE and showed No pulmonary Embolus or Acute Aortic Syndrome but did show Aortic Atherosclerosis and severe Emphysema -As Above  Urinary Urgency -For 1 week patient has been experiencing the need to urinate but voiding only a small amount. Denies  other urinary symptoms.  -PSA was normal and Urinalysis Negative.  -Improved  Changes in bowel habits:  -Patient has been experiencing the need to void but states that for about a week, "only water comes out" and when he looks in the toilet, the discharge appears similar to mucous. States he had 2 normal bowel movements in the last  week. Suspect there may be some constipation.  -C/w Miralax 17 grams po Daily -Continue to Monitor   Essential Hypertension:  -Patient had run out of home BP meds as well, admission BP was 179/128, trending down and now 119/79 -C/w Amlodipine 10 mg po Daily -Pulmonary changed Patient's Home Lisinopril 10 mg po Daily to Irbesartan 37.5 mg po Daily because of Cough   Moderate Malnutrition in the Context of Chronic illness -Nutritionist Consulted and Recc's Appreciated -C/w Boost Breeze 1 Container po TID  Tobacco Abuse/Smoker -Smoking Cessation Counseling given -Nicotine 14 mg TD q24h  DVT prophylaxis: Enoxaparin 40 mg sq q24h Code Status: FULL CODE Family Communication: No family present at bedside Disposition Plan: Home when medically stable as PT/OT recommend no Follow Up.   Consultants:  -Pulmonary Dr. Coralyn Helling   Procedures: None   Antimicrobials:  Anti-infectives    Start     Dose/Rate Route Frequency Ordered Stop   09/21/16 1000  levofloxacin (LEVAQUIN) tablet 500 mg     500 mg Oral Daily 09/21/16 0930     09/19/16 1100  levofloxacin (LEVAQUIN) IVPB 500 mg  Status:  Discontinued     500 mg 100 mL/hr over 60 Minutes Intravenous Every 24 hours 09/19/16 1004 09/21/16 0930     Subjective: Seen and examined at bedside and he stated he felt as if his breathing was slowly improving but was having a difficult time catching his breath especially when he coughed. No nausea or vomiting but feels very dyspneic even when talking or ambulating. No CP or Nausea. No other complaints or concerns at this time.   Objective: Vitals:   09/23/16 0131 09/23/16 0454 09/23/16 0525 09/23/16 0925  BP:   113/75   Pulse:   (!) 101   Resp:   20   Temp:   97.9 F (36.6 C)   TempSrc:   Oral   SpO2: 97% 97% 98% 99%  Weight:      Height:        Intake/Output Summary (Last 24 hours) at 09/23/16 1250 Last data filed at 09/22/16 1900  Gross per 24 hour  Intake              240 ml    Output                0 ml  Net              240 ml   Filed Weights   09/19/16 1014  Weight: 49.5 kg (109 lb 2 oz)   Examination: Physical Exam:  Constitutional: Pleasant thin AAM who is in mild respiratory distress and is a little dyspneic during conversation.  Eyes: Sclerae anicteric; Conjunctivae Non-injected ENMT: External Ears and nose appear normal. Grossly normal hearing. Patient has left earring  Neck: Supple with no JVD Respiratory: Diminished bilaterally with faint expiratory wheezing. Patient was slightly tachypenic and dyspneic when conversing.  Cardiovascular: Slightly tachycardic but regular rhythm. S1 S2 appreciated. No lower extremity edema Abdomen: Soft, NT, ND. Bowel sounds present GU: Deferred Musculoskeletal: Has some clubbing noted. No contractures Skin: Warm and dry. No evidence of any rashes lesions or  bruising Neurologic: CN 2-12 grossly intact. No appreciable focal deficits Psychiatric: Pleasant mood and affect. Intact judgement and insight  Data Reviewed: I have personally reviewed following labs and imaging studies  CBC:  Recent Labs Lab 09/19/16 0739 09/19/16 0750 09/20/16 0618 09/22/16 0539 09/23/16 0511  WBC 6.7  --  9.6 7.4 7.1  NEUTROABS 5.9  --   --  6.2 6.1  HGB 13.2 15.3 14.0 12.4* 13.0  HCT 39.4 45.0 42.6 38.5* 40.5  MCV 70.4*  --  71.5* 71.3* 72.5*  PLT 247  --  256 227 245   Basic Metabolic Panel:  Recent Labs Lab 09/19/16 0750 09/20/16 0618 09/22/16 0539 09/23/16 0511  NA 135 137 138 138  K 4.1 4.1 3.9 3.8  CL 97* 99* 100* 98*  CO2  --  30 30 31   GLUCOSE 137* 143* 134* 169*  BUN 8 10 15 20   CREATININE 0.50* 0.71 0.56* 0.58*  CALCIUM  --  9.3 8.8* 8.8*  MG  --   --  2.3 2.2  PHOS  --   --  3.9 4.2   GFR: Estimated Creatinine Clearance: 70.5 mL/min (A) (by C-G formula based on SCr of 0.58 mg/dL (L)). Liver Function Tests:  Recent Labs Lab 09/22/16 0539 09/23/16 0511  AST 18 18  ALT 15* 16*  ALKPHOS 31* 35*   BILITOT 0.3 0.5  PROT 6.0* 6.2*  ALBUMIN 3.3* 3.3*   No results for input(s): LIPASE, AMYLASE in the last 168 hours. No results for input(s): AMMONIA in the last 168 hours. Coagulation Profile: No results for input(s): INR, PROTIME in the last 168 hours. Cardiac Enzymes: No results for input(s): CKTOTAL, CKMB, CKMBINDEX, TROPONINI in the last 168 hours. BNP (last 3 results) No results for input(s): PROBNP in the last 8760 hours. HbA1C: No results for input(s): HGBA1C in the last 72 hours. CBG: No results for input(s): GLUCAP in the last 168 hours. Lipid Profile: No results for input(s): CHOL, HDL, LDLCALC, TRIG, CHOLHDL, LDLDIRECT in the last 72 hours. Thyroid Function Tests: No results for input(s): TSH, T4TOTAL, FREET4, T3FREE, THYROIDAB in the last 72 hours. Anemia Panel: No results for input(s): VITAMINB12, FOLATE, FERRITIN, TIBC, IRON, RETICCTPCT in the last 72 hours. Sepsis Labs: No results for input(s): PROCALCITON, LATICACIDVEN in the last 168 hours.  Recent Results (from the past 240 hour(s))  Respiratory Panel by PCR     Status: None   Collection Time: 09/21/16  1:00 PM  Result Value Ref Range Status   Adenovirus NOT DETECTED NOT DETECTED Final   Coronavirus 229E NOT DETECTED NOT DETECTED Final   Coronavirus HKU1 NOT DETECTED NOT DETECTED Final   Coronavirus NL63 NOT DETECTED NOT DETECTED Final   Coronavirus OC43 NOT DETECTED NOT DETECTED Final   Metapneumovirus NOT DETECTED NOT DETECTED Final   Rhinovirus / Enterovirus NOT DETECTED NOT DETECTED Final   Influenza A NOT DETECTED NOT DETECTED Final   Influenza B NOT DETECTED NOT DETECTED Final   Parainfluenza Virus 1 NOT DETECTED NOT DETECTED Final   Parainfluenza Virus 2 NOT DETECTED NOT DETECTED Final   Parainfluenza Virus 3 NOT DETECTED NOT DETECTED Final   Parainfluenza Virus 4 NOT DETECTED NOT DETECTED Final   Respiratory Syncytial Virus NOT DETECTED NOT DETECTED Final   Bordetella pertussis NOT DETECTED NOT  DETECTED Final   Chlamydophila pneumoniae NOT DETECTED NOT DETECTED Final   Mycoplasma pneumoniae NOT DETECTED NOT DETECTED Final    Comment: Performed at [facility], [address], [place],  [place] [id number]    Radiology Studies: Ct Angio Chest Pe W Or Wo Contrast  Result Date: 09/22/2016 CLINICAL DATA:  Shortness of breath EXAM: CT ANGIOGRAPHY CHEST WITH CONTRAST TECHNIQUE: Multidetector CT imaging of the chest was performed using the standard protocol during bolus administration of intravenous contrast. Multiplanar CT image reconstructions and MIPs were obtained to evaluate the vascular anatomy. CONTRAST:  100 mL Isovue 370 COMPARISON:  Chest CT 09/19/2016 FINDINGS: Cardiovascular: Contrast injection is sufficient to demonstrate satisfactory opacification of the pulmonary arteries to the segmental level. There is no pulmonary embolus. The main pulmonary artery is within normal limits for size. There is no CT evidence of acute right heart strain. There is calcific aortic atherosclerosis. There is a normal 3-vessel arch branching pattern. Heart size is normal, without pericardial effusion. There are coronary artery calcifications. Mediastinum/Nodes: No mediastinal, hilar or axillary lymphadenopathy. The visualized thyroid and thoracic esophageal course are unremarkable. Lungs/Pleura: There is severe emphysema bilaterally. No focal consolidation or pulmonary edema. No masses or pulmonary nodules. No pleural effusion. Multiple nodular opacities along the tracheal lumen. Upper Abdomen: Contrast bolus timing is not optimized for evaluation of the abdominal organs. Within this limitation, the visualized organs of the upper abdomen are normal. Musculoskeletal: No chest wall abnormality. No acute or significant osseous findings. Review of the MIP images confirms the above findings. IMPRESSION: 1. No pulmonary embolus or acute aortic syndrome. 2. Aortic Atherosclerosis (ICD10-I70.0) and severe Emphysema  (ICD10-J43.9). 3. Nodular opacities along the lower tracheal lumen, probably secretions, given their absence on the recent study. Electronically Signed   By: Deatra RobinsonKevin  Herman M.D.   On: 09/22/2016 19:10   Scheduled Meds: . albuterol  2.5 mg Nebulization Q6H  . amLODipine  10 mg Oral Daily  . budesonide (PULMICORT) nebulizer solution  0.5 mg Nebulization BID  . enoxaparin (LOVENOX) injection  40 mg Subcutaneous Q24H  . feeding supplement  1 Container Oral TID BM  . guaiFENesin  600 mg Oral BID  . irbesartan  37.5 mg Oral Daily  . levofloxacin  500 mg Oral Daily  . methylPREDNISolone (SOLU-MEDROL) injection  40 mg Intravenous Q6H  . nicotine  14 mg Transdermal Daily  . polyethylene glycol  17 g Oral Daily  . sodium chloride flush  3 mL Intravenous Q12H  . umeclidinium bromide  1 puff Inhalation Daily   Continuous Infusions:   LOS: 4 days   Merlene Laughtermair Latif Matthew Lurz, DO [facility] Pager [phone]  If 7PM-7AM, please contact night-coverage [email or web address] Password [email or web address] 09/23/2016, 12:50 PM

## 2016-09-24 DIAGNOSIS — I10 Essential (primary) hypertension: Secondary | ICD-10-CM

## 2016-09-24 LAB — COMPREHENSIVE METABOLIC PANEL
ALK PHOS: 36 U/L — AB (ref 38–126)
ALT: 16 U/L — ABNORMAL LOW (ref 17–63)
ANION GAP: 9 (ref 5–15)
AST: 20 U/L (ref 15–41)
Albumin: 3.3 g/dL — ABNORMAL LOW (ref 3.5–5.0)
BILIRUBIN TOTAL: 0.5 mg/dL (ref 0.3–1.2)
BUN: 17 mg/dL (ref 6–20)
CALCIUM: 9 mg/dL (ref 8.9–10.3)
CO2: 33 mmol/L — ABNORMAL HIGH (ref 22–32)
Chloride: 97 mmol/L — ABNORMAL LOW (ref 101–111)
Creatinine, Ser: 0.59 mg/dL — ABNORMAL LOW (ref 0.61–1.24)
GFR calc Af Amer: >60 mL/min (ref >60)
Glucose, Bld: 168 mg/dL — ABNORMAL HIGH (ref 65–99)
POTASSIUM: 3.8 mmol/L (ref 3.5–5.1)
Sodium: 139 mmol/L (ref 135–145)
Total Protein: 6 g/dL — ABNORMAL LOW (ref 6.5–8.1)

## 2016-09-24 LAB — MAGNESIUM: MAGNESIUM: 2.2 mg/dL (ref 1.7–2.4)

## 2016-09-24 LAB — CBC WITH DIFFERENTIAL/PLATELET
BASOS PCT: 0 %
Basophils Absolute: 0 10*3/uL (ref 0.0–0.1)
Eosinophils Absolute: 0 10*3/uL (ref 0.0–0.7)
Eosinophils Relative: 0 %
HEMATOCRIT: 41.2 % (ref 39.0–52.0)
HEMOGLOBIN: 13.2 g/dL (ref 13.0–17.0)
LYMPHS ABS: 1.1 10*3/uL (ref 0.7–4.0)
LYMPHS PCT: 15 %
MCH: 23.3 pg — AB (ref 26.0–34.0)
MCHC: 32 g/dL (ref 30.0–36.0)
MCV: 72.8 fL — AB (ref 78.0–100.0)
MONO ABS: 0.6 10*3/uL (ref 0.1–1.0)
MONOS PCT: 8 %
NEUTROS ABS: 5.7 10*3/uL (ref 1.7–7.7)
Neutrophils Relative %: 77 %
Platelets: 255 10*3/uL (ref 150–400)
RBC: 5.66 MIL/uL (ref 4.22–5.81)
RDW: 15.7 % — AB (ref 11.5–15.5)
WBC: 7.3 10*3/uL (ref 4.0–10.5)

## 2016-09-24 LAB — PHOSPHORUS: Phosphorus: 4 mg/dL (ref 2.5–4.6)

## 2016-09-24 MED ORDER — IPRATROPIUM-ALBUTEROL 0.5-2.5 (3) MG/3ML IN SOLN
3.0000 mL | Freq: Four times a day (QID) | RESPIRATORY_TRACT | Status: DC
  Administered 2016-09-24 – 2016-09-26 (×7): 3 mL via RESPIRATORY_TRACT
  Filled 2016-09-24 (×8): qty 3

## 2016-09-24 MED ORDER — MORPHINE SULFATE (CONCENTRATE) 10 MG/0.5ML PO SOLN
5.0000 mg | ORAL | Status: DC | PRN
  Administered 2016-09-24: 5 mg via ORAL
  Filled 2016-09-24: qty 0.5

## 2016-09-24 MED ORDER — METHYLPREDNISOLONE SODIUM SUCC 125 MG IJ SOLR
80.0000 mg | Freq: Two times a day (BID) | INTRAMUSCULAR | Status: DC
  Administered 2016-09-24 – 2016-09-26 (×4): 80 mg via INTRAVENOUS
  Filled 2016-09-24 (×4): qty 2

## 2016-09-24 MED ORDER — PANTOPRAZOLE SODIUM 40 MG PO TBEC
40.0000 mg | DELAYED_RELEASE_TABLET | Freq: Two times a day (BID) | ORAL | Status: DC
  Administered 2016-09-24 – 2016-09-26 (×4): 40 mg via ORAL
  Filled 2016-09-24 (×4): qty 1

## 2016-09-24 MED ORDER — IPRATROPIUM BROMIDE 0.02 % IN SOLN
0.5000 mg | Freq: Four times a day (QID) | RESPIRATORY_TRACT | Status: DC
  Administered 2016-09-24: 0.5 mg via RESPIRATORY_TRACT
  Filled 2016-09-24: qty 2.5

## 2016-09-24 MED ORDER — DM-GUAIFENESIN ER 30-600 MG PO TB12
2.0000 | ORAL_TABLET | Freq: Two times a day (BID) | ORAL | Status: DC
  Administered 2016-09-24 – 2016-09-26 (×5): 2 via ORAL
  Filled 2016-09-24 (×5): qty 2

## 2016-09-24 NOTE — Progress Notes (Signed)
PROGRESS NOTE    Matthew Morrison  EAV:[id number] DOB: [birth year] DOA: 09/19/2016 PCP: Patient, No Pcp Per   Brief Narrative:  Matthew Morrison is a 61 y.o. male with a past medical history of Emphysema, Hypertension, Arthritis, HLD and other comorbids who moved to [place] in March from [place]. He has been out of his medications for a few months. He's had 2 visits to the emergency department for shortness of breath over the last 1 month. He was in his usual state of health about a couple days ago when he started developing shortness of breath with wheezing. Has had a cough. It has been dry. Denies any fever. No nausea, vomiting. Has had some chest tightness, but mainly with coughing. Symptoms are not getting better despite treatment at home with nebulizer treatments and so he decided to come into the hospital. In the emergency department, patient was given a breathing treatments and admitted for Hypoxia that was refractory to the Steroid Neb Treatments given in the ED. He is slowly improving and still feels SOB so a CT Chest Angio was ordered to r/o PE as patient was still tachycardic/ CT showed noPE. Patient was slowly improving so Pulmonary was consulted for further management and evaluation for additional recommendations and they adjusted some of his medications. Palliative Care consulted for Symptom management at the recommendation of Pulmonary for his Severe/End Stage Lung Disease.   Assessment & Plan:   Principal Problem:   COPD with acute exacerbation (HCC) Active Problems:   Acute respiratory failure with hypoxia (HCC)   Emphysema of lung (HCC)   Essential hypertension   Hypercholesterolemia   COPD exacerbation (HCC)  Acute Respiratory Failure with Hypoxia likely 2/2 to Severe COPD -Patient desaturated to 84% on Room Air while Ambulating -C/w Supplemental O2 and Wean As tolerated -C/w Continuous Pulse Oximetry and Maintain O2 Saturations >92% -Repeat CXR in AM -Obtained CT Chest Angio to  rule out Pulmonary Embolus and did not show any PE -Pulmonary Consulted and Appreciate Dr. Sood's/Wert's recommendations -Walk Screen qualified patient for O2 yesterday   COPD with Acute exacerbation [facility]):  -Patient continues to have shortness of breath when talking, moving and changing positions. He is on 2L of oxygen and saturation is in the mid 90's on rest and needs increased O2 and 3-4 Liters ambulating.  -Continue Supplemental O2, DuoNeb 3 mL q4h changed to Albuterol 2.5 mg Neb q6h and Ipratropium 0.5 mg Neb q6h and Albuterol 2.5 mg Neb q2hprn for wheezing, Levofloxacin 500 mg po Daily (Day 6/7) -Methylprednisolone 80 mg IV q12h changed to Methylprednisolone 40 mg IV q6h by Pulmonary yesterday and changed back to Methylprednisolone 80 mg IV q12h -Placed Patient on Pantoprazole 40 mg po BID before meals as patient is on IV Steroids.  -C/w Budesonide 0.25 mg Neb RT BID; Arformoterol Neb 15 mcg D/C'd by Pulmonary -Guaifenesin 600 mg po BID changed to Dextromethorphan-Guaifenesin 30-600 2 Tablet po BID  -Added Flutter Valve -NaCl Hypertonic Nebs 4 mL Daily discontinued  -C/w Incentive Spirometry and added Hydroxazine 10 mg po TIDprn for anxiety associated with breathing  -Pulmonary Added Umeclidinium Bromide 1 puff IH Daily yesterday but D/C'd today by Dr. Sherene Sires because ACEi causing intolerance to DPI inhibitor's   -Checked Respiratory Virus Panel and was Negative and patient is no longer on Droplet Precautions -Chest CT Angio for PE was Negative for PE -Consulted Pulmonary for further Recc's and appreciated Recc's -Pulmonary stating patient may benefit from Palliative Care Assessment; Palliative Care Medicine consulted for symptom management  Emphysema of lung (HCC) -CXR indicated some changes in the right upper lobe and a CT was ordered.  -CT Results showed scarring of the right upper lobe and emphysema of the lungs. No nodules or pneumonia suspected.  -As above;  -Repeated the Chest CT  and doing it with Contrast to r/o PE and showed No pulmonary Embolus or Acute Aortic Syndrome but did show Aortic Atherosclerosis and severe Emphysema -As Above  Urinary Urgency -For 1 week patient has been experiencing the need to urinate but voiding only a small amount. Denies other urinary symptoms.  -PSA was normal and Urinalysis Negative.  -Improved  Changes in bowel habits:  -Patient has been experiencing the need to void but states that for about a week, "only water comes out" and when he looks in the toilet, the discharge appears similar to mucous. States he had 2 normal bowel movements in the last week. Suspect there may be some constipation.  -C/w Miralax 17 grams po Daily -Continue to Monitor as he had a normal bowel movement today  Essential Hypertension:  -Patient had run out of home BP meds as well, admission BP was 179/128, trending down and currently is 130/93 -C/w Amlodipine 10 mg po Daily -Pulmonary changed Patient's Home Lisinopril 10 mg po Daily to Irbesartan 37.5 mg po Daily permantly because of Cough  -Per Pulmonary the only way to prove the symptoms are not from ACEi is to be off of ACEi for 6 weeks   Moderate Malnutrition in the Context of Chronic illness -Nutritionist Consulted and Recc's Appreciated -C/w Boost Breeze 1 Container po TID  Tobacco Abuse/Smoker -Smoking Cessation Counseling given -Nicotine 14 mg TD q24h  DVT prophylaxis: Enoxaparin 40 mg sq q24h Code Status: FULL CODE Family Communication: No family present at bedside Disposition Plan: Home when medically stable as PT/OT recommend no Follow Up.   Consultants:  -Pulmonary Dr. Laurier Nancy Sood/Dr. Sandrea Hughs -Palliative Care Medicine for Symptom management    Procedures: None   Antimicrobials:  Anti-infectives    Start     Dose/Rate Route Frequency Ordered Stop   09/21/16 1000  levofloxacin (LEVAQUIN) tablet 500 mg     500 mg Oral Daily 09/21/16 0930     09/19/16 1100  levofloxacin  (LEVAQUIN) IVPB 500 mg  Status:  Discontinued     500 mg 100 mL/hr over 60 Minutes Intravenous Every 24 hours 09/19/16 1004 09/21/16 0930     Subjective: Seen and examined at bedside and was still having a hard time catching his breath. No nausea or vomiting. Had a bowel movement this AM.   Objective: Vitals:   09/24/16 0311 09/24/16 0451 09/24/16 1107 09/24/16 1108  BP:  (!) 130/93    Pulse:  90    Resp:  16    Temp:  97.8 F (36.6 C)    TempSrc:  Oral    SpO2: 95% 98% 97% 98%  Weight:      Height:        Intake/Output Summary (Last 24 hours) at 09/24/16 1325 Last data filed at 09/23/16 1444  Gross per 24 hour  Intake              240 ml  Output                0 ml  Net              240 ml   Filed Weights   09/19/16 1014  Weight: 49.5 kg (109 lb 2 oz)  Examination: Physical Exam:  Constitutional: Pleasant thin AAM in mild respiratory distress, has some dyspnea with conversation.   Eyes: Sclerae anicteric; Lids normal ENMT: Grossly normal hearing. External Ears and Nose appear normal. Wearing Left earring Neck: Supple with no JVD.  Respiratory: Diminished breath sounds with some expiratory wheezing. Patient was slightly tachypenic and wearing supplemental O2 via [place] Cardiovascular: RRR, S1 S2. No lower extremity edema Abdomen: Soft, NT, ND. Bowel sounds present GU: Deferred Musculoskeletal: Has some clubbing. No contractures Skin: Warm and dry. No rashes or lesions noted on a limited skin eval Neurologic: CN 2-12 grossly intact. No appreciable focal deficits Psychiatric: Pleasant mood and affect. Intact judgement and insight.   Data Reviewed: I have personally reviewed following labs and imaging studies  CBC:  Recent Labs Lab 09/19/16 0739 09/19/16 0750 09/20/16 0618 09/22/16 0539 09/23/16 0511 09/24/16 0523  WBC 6.7  --  9.6 7.4 7.1 7.3  NEUTROABS 5.9  --   --  6.2 6.1 5.7  HGB 13.2 15.3 14.0 12.4* 13.0 13.2  HCT 39.4 45.0 42.6 38.5* 40.5 41.2  MCV  70.4*  --  71.5* 71.3* 72.5* 72.8*  PLT 247  --  256 227 245 255   Basic Metabolic Panel:  Recent Labs Lab 09/19/16 0750 09/20/16 0618 09/22/16 0539 09/23/16 0511 09/24/16 0523  NA 135 137 138 138 139  K 4.1 4.1 3.9 3.8 3.8  CL 97* 99* 100* 98* 97*  CO2  --  30 30 31  33*  GLUCOSE 137* 143* 134* 169* 168*  BUN 8 10 15 20 17   CREATININE 0.50* 0.71 0.56* 0.58* 0.59*  CALCIUM  --  9.3 8.8* 8.8* 9.0  MG  --   --  2.3 2.2 2.2  PHOS  --   --  3.9 4.2 4.0   GFR: Estimated Creatinine Clearance: 70.5 mL/min (A) (by C-G formula based on SCr of 0.59 mg/dL (L)). Liver Function Tests:  Recent Labs Lab 09/22/16 0539 09/23/16 0511 09/24/16 0523  AST 18 18 20   ALT 15* 16* 16*  ALKPHOS 31* 35* 36*  BILITOT 0.3 0.5 0.5  PROT 6.0* 6.2* 6.0*  ALBUMIN 3.3* 3.3* 3.3*   No results for input(s): LIPASE, AMYLASE in the last 168 hours. No results for input(s): AMMONIA in the last 168 hours. Coagulation Profile: No results for input(s): INR, PROTIME in the last 168 hours. Cardiac Enzymes: No results for input(s): CKTOTAL, CKMB, CKMBINDEX, TROPONINI in the last 168 hours. BNP (last 3 results) No results for input(s): PROBNP in the last 8760 hours. HbA1C: No results for input(s): HGBA1C in the last 72 hours. CBG: No results for input(s): GLUCAP in the last 168 hours. Lipid Profile: No results for input(s): CHOL, HDL, LDLCALC, TRIG, CHOLHDL, LDLDIRECT in the last 72 hours. Thyroid Function Tests: No results for input(s): TSH, T4TOTAL, FREET4, T3FREE, THYROIDAB in the last 72 hours. Anemia Panel: No results for input(s): VITAMINB12, FOLATE, FERRITIN, TIBC, IRON, RETICCTPCT in the last 72 hours. Sepsis Labs: No results for input(s): PROCALCITON, LATICACIDVEN in the last 168 hours.  Recent Results (from the past 240 hour(s))  Respiratory Panel by PCR     Status: None   Collection Time: 09/21/16  1:00 PM  Result Value Ref Range Status   Adenovirus NOT DETECTED NOT DETECTED Final    Coronavirus 229E NOT DETECTED NOT DETECTED Final   Coronavirus HKU1 NOT DETECTED NOT DETECTED Final   Coronavirus NL63 NOT DETECTED NOT DETECTED Final   Coronavirus OC43 NOT DETECTED NOT DETECTED Final  Metapneumovirus NOT DETECTED NOT DETECTED Final   Rhinovirus / Enterovirus NOT DETECTED NOT DETECTED Final   Influenza A NOT DETECTED NOT DETECTED Final   Influenza B NOT DETECTED NOT DETECTED Final   Parainfluenza Virus 1 NOT DETECTED NOT DETECTED Final   Parainfluenza Virus 2 NOT DETECTED NOT DETECTED Final   Parainfluenza Virus 3 NOT DETECTED NOT DETECTED Final   Parainfluenza Virus 4 NOT DETECTED NOT DETECTED Final   Respiratory Syncytial Virus NOT DETECTED NOT DETECTED Final   Bordetella pertussis NOT DETECTED NOT DETECTED Final   Chlamydophila pneumoniae NOT DETECTED NOT DETECTED Final   Mycoplasma pneumoniae NOT DETECTED NOT DETECTED Final    Comment: Performed at [facility], [address] [place], [place] [address]    Radiology Studies: Ct Angio Chest Pe W Or Wo Contrast  Result Date: 09/22/2016 CLINICAL DATA:  Shortness of breath EXAM: CT ANGIOGRAPHY CHEST WITH CONTRAST TECHNIQUE: Multidetector CT imaging of the chest was performed using the standard protocol during bolus administration of intravenous contrast. Multiplanar CT image reconstructions and MIPs were obtained to evaluate the vascular anatomy. CONTRAST:  100 mL Isovue 370 COMPARISON:  Chest CT 09/19/2016 FINDINGS: Cardiovascular: Contrast injection is sufficient to demonstrate satisfactory opacification of the pulmonary arteries to the segmental level. There is no pulmonary embolus. The main pulmonary artery is within normal limits for size. There is no CT evidence of acute right heart strain. There is calcific aortic atherosclerosis. There is a normal 3-vessel arch branching pattern. Heart size is normal, without pericardial effusion. There are coronary artery calcifications. Mediastinum/Nodes: No mediastinal, hilar  or axillary lymphadenopathy. The visualized thyroid and thoracic esophageal course are unremarkable. Lungs/Pleura: There is severe emphysema bilaterally. No focal consolidation or pulmonary edema. No masses or pulmonary nodules. No pleural effusion. Multiple nodular opacities along the tracheal lumen. Upper Abdomen: Contrast bolus timing is not optimized for evaluation of the abdominal organs. Within this limitation, the visualized organs of the upper abdomen are normal. Musculoskeletal: No chest wall abnormality. No acute or significant osseous findings. Review of the MIP images confirms the above findings. IMPRESSION: 1. No pulmonary embolus or acute aortic syndrome. 2. Aortic Atherosclerosis (ICD10-I70.0) and severe Emphysema (ICD10-J43.9). 3. Nodular opacities along the lower tracheal lumen, probably secretions, given their absence on the recent study. Electronically Signed   By: Deatra RobinsonKevin  Herman M.D.   On: 09/22/2016 19:10   Scheduled Meds: . albuterol  2.5 mg Nebulization Q6H  . amLODipine  10 mg Oral Daily  . budesonide (PULMICORT) nebulizer solution  0.5 mg Nebulization BID  . dextromethorphan-guaiFENesin  2 tablet Oral BID  . enoxaparin (LOVENOX) injection  40 mg Subcutaneous Q24H  . feeding supplement  1 Container Oral TID BM  . ipratropium  0.5 mg Nebulization Q6H  . irbesartan  37.5 mg Oral Daily  . levofloxacin  500 mg Oral Daily  . methylPREDNISolone (SOLU-MEDROL) injection  80 mg Intravenous Q12H  . nicotine  14 mg Transdermal Daily  . pantoprazole  40 mg Oral BID AC  . polyethylene glycol  17 g Oral Daily  . sodium chloride flush  3 mL Intravenous Q12H   Continuous Infusions:   LOS: 5 days   Merlene Laughtermair Latif Sheikh, DO [facility] Pager [phone]  If 7PM-7AM, please contact night-coverage [email or web address] Password [email or web address] 09/24/2016, 1:25 PM

## 2016-09-24 NOTE — Progress Notes (Signed)
PCCM Consult Note  Admission date: 09/19/2016 Consult date: 09/23/2016 Referring provider: Dr. Marland McalpineSheikh, [facility]  CC: Short of breath  HPI: 61 yo male smoker presented with dyspnea, cough, and wheezing for 2 days.  He was living in [place] and moved to [place] in March 2018.  He has hx of COPD with emphysema.  He had been out of his medications after moving.   Subjective Uncomfortable unless sitting upright/ tripod position/ can't cough up any mucus though cough sounds dry to me    Vital signs: BP (!) 130/93 (BP Location: Right Arm)   Pulse 90   Temp 97.8 F (36.6 C) (Oral)   Resp 16   Ht 5\' 4"  (1.626 m)   Wt 109 lb 2 oz (49.5 kg)   SpO2 98%   BMI 18.73 kg/m   Intake/output: I/O last 3 completed shifts: In: 480 [P.O.:480] Out: -   General: chronically ill , wearing oxygen 2lpm np Neuro: CN intact, normal strength HEENT: Pupils reactive, no sinus tenderness, no oral exudate, faint "wheeze" over anterior neck, no LAN Cardiac: regular, no murmur Chest: decreased BS, prolonged exhalation/ pseudowheeze better with plm  Abd: soft, non tender, + bowel sounds Ext: no edema Skin: no rashes   CMP Latest Ref Rng & Units 09/24/2016 09/23/2016 09/22/2016  Glucose 65 - 99 mg/dL 409(W168(H) 119(J169(H) 478(G134(H)  BUN 6 - 20 mg/dL 17 20 15   Creatinine 0.61 - 1.24 mg/dL 9.56(O0.59(L) 1.30(Q0.58(L) 6.57(Q0.56(L)  Sodium 135 - 145 mmol/L 139 138 138  Potassium 3.5 - 5.1 mmol/L 3.8 3.8 3.9  Chloride 101 - 111 mmol/L 97(L) 98(L) 100(L)  CO2 22 - 32 mmol/L 33(H) 31 30  Calcium 8.9 - 10.3 mg/dL 9.0 4.6(N8.8(L) 6.2(X8.8(L)  Total Protein 6.5 - 8.1 g/dL 6.0(L) 6.2(L) 6.0(L)  Total Bilirubin 0.3 - 1.2 mg/dL 0.5 0.5 0.3  Alkaline Phos 38 - 126 U/L 36(L) 35(L) 31(L)  AST 15 - 41 U/L 20 18 18   ALT 17 - 63 U/L 16(L) 16(L) 15(L)     CBC Latest Ref Rng & Units 09/24/2016 09/23/2016 09/22/2016  WBC 4.0 - 10.5 K/uL 7.3 7.1 7.4  Hemoglobin 13.0 - 17.0 g/dL 52.813.2 41.313.0 12.4(L)  Hematocrit 39.0 - 52.0 % 41.2 40.5 38.5(L)  Platelets 150 -  400 K/uL 255 245 227     ABG    Component Value Date/Time   TCO2 29 09/19/2016 0750     CBG (last 3)  No results for input(s): GLUCAP in the last 72 hours.   Imaging: Ct Angio Chest Pe W Or Wo Contrast  Result Date: 09/22/2016 CLINICAL DATA:  Shortness of breath EXAM: CT ANGIOGRAPHY CHEST WITH CONTRAST TECHNIQUE: Multidetector CT imaging of the chest was performed using the standard protocol during bolus administration of intravenous contrast. Multiplanar CT image reconstructions and MIPs were obtained to evaluate the vascular anatomy. CONTRAST:  100 mL Isovue 370 COMPARISON:  Chest CT 09/19/2016 FINDINGS: Cardiovascular: Contrast injection is sufficient to demonstrate satisfactory opacification of the pulmonary arteries to the segmental level. There is no pulmonary embolus. The main pulmonary artery is within normal limits for size. There is no CT evidence of acute right heart strain. There is calcific aortic atherosclerosis. There is a normal 3-vessel arch branching pattern. Heart size is normal, without pericardial effusion. There are coronary artery calcifications. Mediastinum/Nodes: No mediastinal, hilar or axillary lymphadenopathy. The visualized thyroid and thoracic esophageal course are unremarkable. Lungs/Pleura: There is severe emphysema bilaterally. No focal consolidation or pulmonary edema. No masses or pulmonary nodules. No pleural  effusion. Multiple nodular opacities along the tracheal lumen. Upper Abdomen: Contrast bolus timing is not optimized for evaluation of the abdominal organs. Within this limitation, the visualized organs of the upper abdomen are normal. Musculoskeletal: No chest wall abnormality. No acute or significant osseous findings. Review of the MIP images confirms the above findings. IMPRESSION: 1. No pulmonary embolus or acute aortic syndrome. 2. Aortic Atherosclerosis (ICD10-I70.0) and severe Emphysema (ICD10-J43.9). 3. Nodular opacities along the lower tracheal  lumen, probably secretions, given their absence on the recent study. Electronically Signed   By: Deatra Robinson M.D.   On: 09/22/2016 19:10     Studies: CT chest 09/19/16 >> advanced emphysema, atherosclerosis, b/l scarring CT angio chest 09/22/16 >> no PE  Antibiotics: Levaquin 6/18 >>   Cultures: Respiratory viral panel 6/20 >> negative  Events: 6/18 Admit  Summary: 61 yo male former smoker with COPD exacerbation.  Has persistent upper airway wheeze, and has been on ACE inhibitor.  He has difficulty affording his medications.  Assessment/plan:  COPD exacerbation. Advanced COPD with emphysema. - change solumedrol to  80 q 12  - pulmicort,  Albuterol/atrovent qid     Acute on chronic hypoxic respiratory failure. - oxygen to keep SpO2 > 90%  ACE inhibitor cough. - change to ARB permanently - In the best review of chronic cough to date ( NEJM 2016 375 [id number]) ,  ACEi are now felt to cause cough in up to  20% of pts which is a 4 fold increase from previous reports and does not include the variety of non-specific complaints we see in pulmonary clinic in pts on ACEi but previously attributed to another dx like  Copd/asthma and  include PNDS, throat and chest congestion, "bronchitis" (s excess or purulent sputum) , unexplained dyspnea and noct "strangling" sensations, and hoarseness, but also  atypical /refractory GERD symptoms like dysphagia and "bad heartburn" and intolerance to DPI inhalers (so d/c incruse)    The only way I know  to prove this is not an "ACEi Case" is a trial off ACEi x a minimum of 6 weeks then regroup.    Severe protein calorie malnutrition. - per primary team  DVT prophylaxis - lovenox SUP - not indicated Nutrition - regular diet Goals of care - full code.  Might  benefit from palliative care assessment >> defer to primary team

## 2016-09-24 NOTE — Consult Note (Signed)
Consultation Note Date: 09/24/2016   Patient Name: Matthew Morrison  DOB: 1955/09/23  MRN: [id number]  Age / Sex: 61 y.o., male  PCP: Patient, No Pcp Per Referring Physician: Kerney Elbe, DO  Reason for Consultation: Non pain symptom management  HPI/Patient Profile: 61 y.o. male  with past medical history of  admitted on 09/19/2016 with acute respiratory failure related to severe COPD.  Palliative consulted for symptom management of dyspnea.   Clinical Assessment and Goals of Care: I met today with Matthew Morrison.  We discussed his continued decline in functional status related to continued shortness of breath.   His shortness of breath worsens with activity, improves with rest, and prevents him from completing his ADLs.   SUMMARY OF RECOMMENDATIONS   - Discussed non-pharmacologic interventions for dyspnea.  Discussed risk vs benefit of low dose morphine in the treatment of dyspnea.  Plan for trial. - Discussed opioid induced constipation.  Continue miralax - Follow-up tomorrow  Code Status/Advance Care Planning:  Full code   Symptom Management:   Dyspnea:  Discussed at length with patient.  Plan for bedside fan.  Plan for trial of low dose morphine.  Will start with 62m every 4 hours as needed.  Prognosis:   > 12 months  Discharge Planning: To Be Determined      Primary Diagnoses: Present on Admission: . COPD with acute exacerbation ([place] . Acute respiratory failure with hypoxia ([place] . Emphysema of lung ([place] . Essential hypertension . Hypercholesterolemia . COPD exacerbation ([place]   I have reviewed the medical record, interviewed the patient and family, and examined the patient. The following aspects are pertinent.  Past Medical History:  Diagnosis Date  . Asthma   . Emphysema lung ([place]   . Hypertension    Social History   Social History  . Marital status: Single    Spouse  name: N/A  . Number of children: N/A  . Years of education: N/A   Social History Main Topics  . Smoking status: Current Every Day Smoker    Packs/day: 0.25    Types: Cigarettes  . Smokeless tobacco: Never Used  . Alcohol use Yes  . Drug use: Unknown  . Sexual activity: Not Asked   Other Topics Concern  . None   Social History Narrative  . None   Family History  Problem Relation Age of Onset  . Depression Mother   . Stroke Father    Scheduled Meds: . amLODipine  10 mg Oral Daily  . budesonide (PULMICORT) nebulizer solution  0.5 mg Nebulization BID  . dextromethorphan-guaiFENesin  2 tablet Oral BID  . enoxaparin (LOVENOX) injection  40 mg Subcutaneous Q24H  . feeding supplement  1 Container Oral TID BM  . ipratropium-albuterol  3 mL Nebulization Q6H  . irbesartan  37.5 mg Oral Daily  . levofloxacin  500 mg Oral Daily  . methylPREDNISolone (SOLU-MEDROL) injection  80 mg Intravenous Q12H  . nicotine  14 mg Transdermal Daily  . pantoprazole  40 mg Oral BID AC  .  polyethylene glycol  17 g Oral Daily  . sodium chloride flush  3 mL Intravenous Q12H   Continuous Infusions: PRN Meds:.albuterol, hydrOXYzine, morphine CONCENTRATE, [DISCONTINUED] ondansetron **OR** ondansetron (ZOFRAN) IV, oxyCODONE Medications Prior to Admission:  Prior to Admission medications   Medication Sig Start Date End Date Taking? Authorizing Provider  albuterol (PROVENTIL) (2.5 MG/3ML) 0.083% nebulizer solution Take 3 mLs (2.5 mg total) by nebulization every 6 (six) hours as needed for wheezing or shortness of breath. 09/03/16  Yes Daleen Bo, MD  amLODipine (NORVASC) 5 MG tablet Take 1 tablet (5 mg total) by mouth daily. 08/03/16 09/19/17 Yes Fawze, Mina A, PA-C  ipratropium (ATROVENT) 0.02 % nebulizer solution Take 2.5 mLs (0.5 mg total) by nebulization every 4 (four) hours as needed for wheezing or shortness of breath. 08/03/16  Yes Fawze, Mina A, PA-C  lisinopril (PRINIVIL,ZESTRIL) 10 MG tablet Take 1  tablet (10 mg total) by mouth daily. 08/03/16  Yes Fawze, Mina A, PA-C  predniSONE (DELTASONE) 20 MG tablet Take 2 tablets (40 mg total) by mouth daily. 09/03/16  Yes Little, Wenda Overland, MD  Fluticasone-Salmeterol (ADVAIR DISKUS) 250-50 MCG/DOSE AEPB Inhale 1 puff into the lungs 2 (two) times daily. Patient not taking: Reported on 09/19/2016 08/03/16   Rodell Perna A, PA-C   Allergies  Allergen Reactions  . Norflex [Orphenadrine] Hives   Review of Systems  Constitutional: Positive for activity change and fatigue.  Respiratory: Positive for shortness of breath and wheezing.   Neurological: Positive for weakness.   Physical Exam General: Alert, awake, in mild respiratory distress with some conversational dyspnea.  HEENT: No bruits, no goiter, no JVD Heart: Regular rate and rhythm. No murmur appreciated. Lungs: Diminished air movement, scattered wheezes Abdomen: Soft, nontender, nondistended, positive bowel sounds.  Ext: No significant edema Skin: Warm and dry Neuro: Grossly intact, nonfocal.  Vital Signs: BP (!) 127/93   Pulse (!) 109   Temp 98 F (36.7 C) (Oral)   Resp 17   Ht _0  (1.626 m)   Wt 49.5 kg (109 lb 2 oz)   SpO2 97%   BMI 18.73 kg/m  Pain Assessment: No/denies pain (for sob as ordered)   Pain Score: 0-No pain   SpO2: SpO2: 97 % O2 Device:SpO2: 97 % O2 Flow Rate: .O2 Flow Rate (L/min): 2 L/min  IO: Intake/output summary:  Intake/Output Summary (Last 24 hours) at 09/24/16 2330 Last data filed at 09/24/16 0950  Gross per 24 hour  Intake              240 ml  Output                0 ml  Net              240 ml    LBM: Last BM Date: 09/24/16 Baseline Weight: Weight: 49.5 kg (109 lb 2 oz) Most recent weight: Weight: 49.5 kg (109 lb 2 oz)     Palliative Assessment/Data:   Flowsheet Rows     Most Recent Value  Intake Tab  Referral Department  Hospitalist  Unit at Time of Referral  Med/Surg Unit  Palliative Care Primary Diagnosis  Pulmonary  Date  Notified  09/24/16  Palliative Care Type  New Palliative care  Reason for referral  Non-pain Symptom  Date of Admission  09/19/16  Date first seen by Palliative Care  09/24/16  # of days Palliative referral response time  0 Day(s)  # of days IP prior to Palliative referral  5  Clinical Assessment  Palliative Performance Scale Score  50%  Psychosocial & Spiritual Assessment  Palliative Care Outcomes  Patient/Family meeting held?  Yes  Who was at the meeting?  Patient  Palliative Care Outcomes  Improved non-pain symptom therapy      Time In: 1830 Time Out: 1915 Time Total: 45 Greater than 50%  of this time was spent counseling and coordinating care related to the above assessment and plan.  Signed by: Micheline Rough, MD   Please contact Palliative Medicine Team phone at [phone] for questions and concerns.  For individual provider: See Shea Evans

## 2016-09-25 DIAGNOSIS — K5903 Drug induced constipation: Secondary | ICD-10-CM

## 2016-09-25 DIAGNOSIS — Z515 Encounter for palliative care: Secondary | ICD-10-CM

## 2016-09-25 LAB — COMPREHENSIVE METABOLIC PANEL
ALT: 15 U/L — ABNORMAL LOW (ref 17–63)
AST: 13 U/L — ABNORMAL LOW (ref 15–41)
Albumin: 3.1 g/dL — ABNORMAL LOW (ref 3.5–5.0)
Alkaline Phosphatase: 32 U/L — ABNORMAL LOW (ref 38–126)
Anion gap: 5 (ref 5–15)
BUN: 24 mg/dL — ABNORMAL HIGH (ref 6–20)
CHLORIDE: 99 mmol/L — AB (ref 101–111)
CO2: 35 mmol/L — ABNORMAL HIGH (ref 22–32)
Calcium: 8.6 mg/dL — ABNORMAL LOW (ref 8.9–10.3)
Creatinine, Ser: 0.51 mg/dL — ABNORMAL LOW (ref 0.61–1.24)
Glucose, Bld: 104 mg/dL — ABNORMAL HIGH (ref 65–99)
Potassium: 4.2 mmol/L (ref 3.5–5.1)
Sodium: 139 mmol/L (ref 135–145)
Total Bilirubin: 0.7 mg/dL (ref 0.3–1.2)
Total Protein: 5.7 g/dL — ABNORMAL LOW (ref 6.5–8.1)

## 2016-09-25 LAB — CBC WITH DIFFERENTIAL/PLATELET
Basophils Absolute: 0 10*3/uL (ref 0.0–0.1)
Basophils Relative: 0 %
Eosinophils Absolute: 0 10*3/uL (ref 0.0–0.7)
Eosinophils Relative: 0 %
HCT: 38.2 % — ABNORMAL LOW (ref 39.0–52.0)
Hemoglobin: 12.4 g/dL — ABNORMAL LOW (ref 13.0–17.0)
LYMPHS ABS: 1 10*3/uL (ref 0.7–4.0)
LYMPHS PCT: 15 %
MCH: 23.4 pg — AB (ref 26.0–34.0)
MCHC: 32.5 g/dL (ref 30.0–36.0)
MCV: 72.1 fL — ABNORMAL LOW (ref 78.0–100.0)
MONO ABS: 0.9 10*3/uL (ref 0.1–1.0)
MONOS PCT: 13 %
Neutro Abs: 5 10*3/uL (ref 1.7–7.7)
Neutrophils Relative %: 72 %
PLATELETS: 234 10*3/uL (ref 150–400)
RBC: 5.3 MIL/uL (ref 4.22–5.81)
RDW: 15.6 % — AB (ref 11.5–15.5)
WBC: 6.9 10*3/uL (ref 4.0–10.5)

## 2016-09-25 LAB — MAGNESIUM: MAGNESIUM: 2.3 mg/dL (ref 1.7–2.4)

## 2016-09-25 LAB — PHOSPHORUS: PHOSPHORUS: 3.7 mg/dL (ref 2.5–4.6)

## 2016-09-25 MED ORDER — SENNOSIDES-DOCUSATE SODIUM 8.6-50 MG PO TABS
1.0000 | ORAL_TABLET | Freq: Two times a day (BID) | ORAL | Status: DC
  Administered 2016-09-25 – 2016-09-26 (×3): 1 via ORAL
  Filled 2016-09-25 (×3): qty 1

## 2016-09-25 MED ORDER — MORPHINE SULFATE 15 MG PO TABS
7.5000 mg | ORAL_TABLET | ORAL | Status: DC | PRN
  Administered 2016-09-25: 7.5 mg via ORAL
  Filled 2016-09-25: qty 1

## 2016-09-25 NOTE — Progress Notes (Signed)
Daily Progress Note   Matthew Morrison Name: Matthew Morrison       Date: 09/25/2016 DOB: 09-11-1955  Age: 61 y.o. MRN#: [id number] Attending Physician: Kerney Elbe, DO Primary Care Physician: Matthew Morrison, No Pcp Per Admit Date: 09/19/2016  Reason for Consultation/Follow-up: Non pain symptom management  Subjective: I met with Matthew Morrison this AM.  He continues to experience shortness of breath, worse with exertion.  He thinks that morphine he received helped "a little."  He denies any side effects.  Discussed continuing to use prn and that he will likely benefit from using 20-30 minutes prior to activities that cause his to become SOB.  See below  Length of Stay: 6  Current Medications: Scheduled Meds:  . amLODipine  10 mg Oral Daily  . budesonide (PULMICORT) nebulizer solution  0.5 mg Nebulization BID  . dextromethorphan-guaiFENesin  2 tablet Oral BID  . enoxaparin (LOVENOX) injection  40 mg Subcutaneous Q24H  . feeding supplement  1 Container Oral TID BM  . ipratropium-albuterol  3 mL Nebulization Q6H  . irbesartan  37.5 mg Oral Daily  . levofloxacin  500 mg Oral Daily  . methylPREDNISolone (SOLU-MEDROL) injection  80 mg Intravenous Q12H  . nicotine  14 mg Transdermal Daily  . pantoprazole  40 mg Oral BID AC  . polyethylene glycol  17 g Oral Daily  . sodium chloride flush  3 mL Intravenous Q12H    Continuous Infusions:   PRN Meds: albuterol, hydrOXYzine, morphine, [DISCONTINUED] ondansetron **OR** ondansetron (ZOFRAN) IV  Physical Exam         General: Alert, awake, in mild respiratory distress with some conversational dyspnea.  HEENT: No bruits, no goiter, no JVD Heart: Regular rate and rhythm. No murmur appreciated. Lungs: Diminished air movement, scattered wheezes Abdomen: Soft,  nontender, nondistended, positive bowel sounds.  Ext: No significant edema Skin: Warm and dry Neuro: Grossly intact, nonfocal.  Vital Signs: BP 129/81 (BP Location: Right Arm)   Pulse 99   Temp 98.4 F (36.9 C) (Oral)   Resp 18   Ht 5' 4"  (1.626 m)   Wt 49.5 kg (109 lb 2 oz)   SpO2 99%   BMI 18.73 kg/m  SpO2: SpO2: 99 % O2 Device: O2 Device: Nasal Cannula O2 Flow Rate: O2 Flow Rate (L/min): 3 L/min  Intake/output summary:  Intake/Output Summary (  Last 24 hours) at 09/25/16 1107 Last data filed at 09/24/16 2300  Gross per 24 hour  Intake              960 ml  Output              600 ml  Net              360 ml   LBM: Last BM Date: 09/24/16 Baseline Weight: Weight: 49.5 kg (109 lb 2 oz) Most recent weight: Weight: 49.5 kg (109 lb 2 oz)       Palliative Assessment/Data:    Flowsheet Rows     Most Recent Value  Intake Tab  Referral Department  Hospitalist  Unit at Time of Referral  Med/Surg Unit  Palliative Care Primary Diagnosis  Pulmonary  Date Notified  09/24/16  Palliative Care Type  New Palliative care  Reason for referral  Non-pain Symptom  Date of Admission  09/19/16  Date first seen by Palliative Care  09/24/16  # of days Palliative referral response time  0 Day(s)  # of days IP prior to Palliative referral  5  Clinical Assessment  Palliative Performance Scale Score  50%  Psychosocial & Spiritual Assessment  Palliative Care Outcomes  Matthew Morrison/Family meeting held?  Yes  Who was at the meeting?  Matthew Morrison  Palliative Care Outcomes  Improved non-pain symptom therapy      Matthew Morrison Active Problem List   Diagnosis Date Noted  . COPD with acute exacerbation ([place]) 09/19/2016  . Acute respiratory failure with hypoxia ([place]) 09/19/2016  . Emphysema of lung ([place]) 09/19/2016  . Essential hypertension 09/19/2016  . Hypercholesterolemia 09/19/2016  . COPD exacerbation ([place]) 09/19/2016    Palliative Care Assessment & Plan   Matthew Morrison Profile: 61 y.o. male  with  past medical history of  admitted on 09/19/2016 with acute respiratory failure related to severe COPD.  Palliative consulted for symptom management of dyspnea.   Recommendations/Plan: - Dyspnea: Reports "a little" improvement in his dyspnea with morphine.  No side effects and will therefore increase dose.  He thinks a pill would be easier to take. Increase to morphine 7.77m (1/2 of 163mtab) every 4 hours as needed.  - Constipation: Concern this will worsen with initiation of opioid therapy.  Continue Miralax.  Goals of Care and Additional Recommendations:  Limitations on Scope of Treatment: Full Scope Treatment  Code Status:    Code Status Orders        Start     Ordered   09/19/16 1005  Full code  Continuous     09/19/16 1004    Code Status History    Date Active Date Inactive Code Status Order ID Comments User Context   This Matthew Morrison has a current code status but no historical code status.       Prognosis:   Unable to determine  Discharge Planning:  [place] [facility] with Matthew Morrison, Dr. ShAlfredia Fergusonbedside RN  Thank you for allowing the Palliative Medicine Team to assist in the care of this Matthew Morrison.   Time In: 1045 Time Out: 1105 Total Time 20 Prolonged Time Billed No      Greater than 50%  of this time was spent counseling and coordinating care related to the above assessment and plan.  GeMicheline RoughMD  Please contact Palliative Medicine Team phone at [phone] questions and concerns.

## 2016-09-25 NOTE — Progress Notes (Signed)
PROGRESS NOTE    Matthew Morrison  ZOX:[id number] DOB: 08-03-1955 DOA: 09/19/2016 PCP: Patient, No Pcp Per   Brief Narrative:  Matthew Morrison is a 61 y.o. male with a past medical history of Emphysema, Hypertension, Arthritis, HLD and other comorbids who moved to [place] in March from [place]. He has been out of his medications for a few months. He's had 2 visits to the emergency department for shortness of breath over the last 1 month. He was in his usual state of health about a couple days ago when he started developing shortness of breath with wheezing. Has had a cough. It has been dry. Denies any fever. No nausea, vomiting. Has had some chest tightness, but mainly with coughing. Symptoms are not getting better despite treatment at home with nebulizer treatments and so he decided to come into the hospital. In the emergency department, patient was given a breathing treatments and admitted for Hypoxia that was refractory to the Steroid Neb Treatments given in the ED. He is slowly improving and still feels SOB so a CT Chest Angio was ordered to r/o PE as patient was still tachycardic/ CT showed noPE. Patient was slowly improving so Pulmonary was consulted for further management and evaluation for additional recommendations and they adjusted some of his medications. Palliative Care consulted for Symptom management at the recommendation of Pulmonary for his Severe/End Stage Lung Disease. Palliative Care placed the patient on po Morphine for Dyspnea and it helped so Morphine was increased to 7.5 mg po q4hprn.   Assessment & Plan:   Principal Problem:   COPD with acute exacerbation (HCC) Active Problems:   Acute respiratory failure with hypoxia (HCC)   Emphysema of lung (HCC)   Essential hypertension   Hypercholesterolemia   COPD exacerbation (HCC)  Acute Respiratory Failure with Hypoxia likely 2/2 to Severe COPD -Patient desaturated to 84% on Room Air while Ambulating  -C/w Supplemental O2 and Wean As  tolerated -C/w Continuous Pulse Oximetry and Maintain O2 Saturations >92% -Repeat CXR in AM -Obtained CT Chest Angio to rule out Pulmonary Embolus and did not show any PE -Pulmonary Consulted and Appreciate Dr. Sood's/Wert's recommendations -Walk Screen qualified patient for O2   COPD with Acute exacerbation [facility]):  -Patient continues to have shortness of breath when talking, moving and changing positions. He is on 2L of oxygen and saturation is in the mid 90's on rest and needs increased O2 and 3-4 Liters ambulating.  -Continue Supplemental O2, DuoNeb 3 mL q4h changed to Albuterol 2.5 mg Neb q6h and Ipratropium 0.5 mg Neb q6h by Pulmonary and Albuterol 2.5 mg Neb q2hprn for wheezing, Levofloxacin 500 mg po Daily (Day 7/7)  -C/w Methylprednisolone 80 mg IV q12h -Placed Patient on Pantoprazole 40 mg po BID before meals as patient is on IV Steroids.  -C/w Budesonide 0.25 mg Neb RT BID; Arformoterol Neb 15 mcg D/C'd by Pulmonary -Guaifenesin 600 mg po BID changed to Dextromethorphan-Guaifenesin 30-600 2 Tablet po BID by Pulmonary  -Added Flutter Valve -NaCl Hypertonic Nebs 4 mL Daily discontinued  -C/w Incentive Spirometry and added Hydroxazine 10 mg po TIDprn for anxiety associated with breathing  -Pulmonary Added Umeclidinium Bromide 1 puff IH Daily yesterday but D/C'd 09/24/16 by Dr. Sherene Sires because ACEi causing intolerance to DPI inhibitor's   -Checked Respiratory Virus Panel and was Negative and patient is no longer on Droplet Precautions -Chest CT Angio for PE was Negative for PE -Consulted Pulmonary for further Recc's and appreciated Recc's -Pulmonary stating patient may benefit from Palliative  Care Assessment; Palliative Care Medicine consulted for symptom management -Palliative Care placed the patient on po Morphine 5 mg po q4hprn and increased it to 7.5 mg po q4hprn for WOB and Dyspnea   Emphysema of lung (HCC) -CXR indicated some changes in the right upper lobe and a CT was ordered.    -CT Results showed scarring of the right upper lobe and emphysema of the lungs. No nodules or pneumonia suspected.  -As above;  -Repeated the Chest CT and doing it with Contrast to r/o PE and showed No pulmonary Embolus or Acute Aortic Syndrome but did show Aortic Atherosclerosis and severe Emphysema -As Above  Urinary Urgency -For 1 week patient has been experiencing the need to urinate but voiding only a small amount. Denies other urinary symptoms.  -PSA was normal and Urinalysis Negative.  -Improved  Changes in bowel habits concern for Constipation:  -Patient has been experiencing the need to void but states that for about a week, "only water comes out" and when he looks in the toilet, the discharge appears similar to mucous. States he had 2 normal bowel movements in the last week. Suspect there may be some constipation.  -C/w Miralax 17 grams po Daily; Will add Senna-Docusate now that patient will be on Senna-Docusate 1 tab po BID -Continue to Monitor as he had a normal bowel movement yesterday  Essential Hypertension:  -Patient had run out of home BP meds as well, admission BP was 179/128, trending down and currently is 129/81 -C/w Amlodipine 10 mg po Daily -Pulmonary changed Patient's Home Lisinopril 10 mg po Daily to Irbesartan 37.5 mg po Daily permantly because of Cough  -Per Pulmonary the only way to prove the symptoms are not from ACEi is to be off of ACEi for 6 weeks   Moderate Malnutrition in the Context of Chronic illness -Nutritionist Consulted and Recc's Appreciated -C/w Boost Breeze 1 Container po TID  Tobacco Abuse/Smoker -Smoking Cessation Counseling given -Nicotine 14 mg TD q24h  DVT prophylaxis: Enoxaparin 40 mg sq q24h Code Status: FULL CODE Family Communication: No family present at bedside Disposition Plan: Home when medically stable possibly in AM  Consultants:  -Pulmonary Dr. Laurier Nancy Sood/Dr. Sandrea Hughs -Palliative Care Medicine Dr. Neale Burly for  Symptom management    Procedures: None   Antimicrobials:  Anti-infectives    Start     Dose/Rate Route Frequency Ordered Stop   09/21/16 1000  levofloxacin (LEVAQUIN) tablet 500 mg     500 mg Oral Daily 09/21/16 0930     09/19/16 1100  levofloxacin (LEVAQUIN) IVPB 500 mg  Status:  Discontinued     500 mg 100 mL/hr over 60 Minutes Intravenous Every 24 hours 09/19/16 1004 09/21/16 0930     Subjective: Seen and examined at bedside and was getting a nebulized breathing treatment. Thinks the po Morphine he was placed on yesterday helped a little. No nausea or vomiting and still gets extremely SOB ambulating.    Objective: Vitals:   09/24/16 2029 09/24/16 2219 09/24/16 2251 09/25/16 0601  BP:  (!) 129/100 (!) 127/93 129/81  Pulse:  (!) 109  99  Resp:  17  18  Temp:  98 F (36.7 C)  98.4 F (36.9 C)  TempSrc:  Oral  Oral  SpO2: 97% 97%  99%  Weight:      Height:        Intake/Output Summary (Last 24 hours) at 09/25/16 1251 Last data filed at 09/24/16 2300  Gross per 24 hour  Intake  960 ml  Output              600 ml  Net              360 ml   Filed Weights   09/19/16 1014  Weight: 49.5 kg (109 lb 2 oz)   Examination: Physical Exam:  Constitutional: Patient is a thin AAM in NAD who is appearing calm but slightly tachypenic. Not as dyspneic this AM.  Eyes: Sclerae anicteric. Conjunctivae non-injected and lids are normal ENMT: Grossly normal hearing. Mucous membranes appear moist. External ears and nose appear normal. Neck: Supple with no JVD or visible Thyromegaly Respiratory: Diminished breath sounds with bilateral expiratory wheezing. Patient was receiving an breathing treatment and was slightly tachypenic. Wearing supplemental O2 via [place] Cardiovascular: Slightly tachycardic but regular rhythm. No extremity edema Abdomen: Soft, NT, ND. Bowel Sounds present GU: Deferre Musculoskeletal: Has Digital Clubbing. No contractures Skin: Warm and dry. No evidence  of any rashes or lesions Neurologic: CN 2-12 grossly intact. No focal deficits.  Psychiatric: Pleasant mood and affect. Intact Judgement and Insight. Awake and O x3.    Data Reviewed: I have personally reviewed following labs and imaging studies  CBC:  Recent Labs Lab 09/19/16 0739  09/20/16 0618 09/22/16 0539 09/23/16 0511 09/24/16 0523 09/25/16 0802  WBC 6.7  --  9.6 7.4 7.1 7.3 6.9  NEUTROABS 5.9  --   --  6.2 6.1 5.7 5.0  HGB 13.2  < > 14.0 12.4* 13.0 13.2 12.4*  HCT 39.4  < > 42.6 38.5* 40.5 41.2 38.2*  MCV 70.4*  --  71.5* 71.3* 72.5* 72.8* 72.1*  PLT 247  --  256 227 245 255 234  < > = values in this interval not displayed. Basic Metabolic Panel:  Recent Labs Lab 09/20/16 0618 09/22/16 0539 09/23/16 0511 09/24/16 0523 09/25/16 0802  NA 137 138 138 139 139  K 4.1 3.9 3.8 3.8 4.2  CL 99* 100* 98* 97* 99*  CO2 30 30 31  33* 35*  GLUCOSE 143* 134* 169* 168* 104*  BUN 10 15 20 17  24*  CREATININE 0.71 0.56* 0.58* 0.59* 0.51*  CALCIUM 9.3 8.8* 8.8* 9.0 8.6*  MG  --  2.3 2.2 2.2 2.3  PHOS  --  3.9 4.2 4.0 3.7   GFR: Estimated Creatinine Clearance: 70.5 mL/min (A) (by C-G formula based on SCr of 0.51 mg/dL (L)). Liver Function Tests:  Recent Labs Lab 09/22/16 0539 09/23/16 0511 09/24/16 0523 09/25/16 0802  AST 18 18 20  13*  ALT 15* 16* 16* 15*  ALKPHOS 31* 35* 36* 32*  BILITOT 0.3 0.5 0.5 0.7  PROT 6.0* 6.2* 6.0* 5.7*  ALBUMIN 3.3* 3.3* 3.3* 3.1*   No results for input(s): LIPASE, AMYLASE in the last 168 hours. No results for input(s): AMMONIA in the last 168 hours. Coagulation Profile: No results for input(s): INR, PROTIME in the last 168 hours. Cardiac Enzymes: No results for input(s): CKTOTAL, CKMB, CKMBINDEX, TROPONINI in the last 168 hours. BNP (last 3 results) No results for input(s): PROBNP in the last 8760 hours. HbA1C: No results for input(s): HGBA1C in the last 72 hours. CBG: No results for input(s): GLUCAP in the last 168 hours. Lipid  Profile: No results for input(s): CHOL, HDL, LDLCALC, TRIG, CHOLHDL, LDLDIRECT in the last 72 hours. Thyroid Function Tests: No results for input(s): TSH, T4TOTAL, FREET4, T3FREE, THYROIDAB in the last 72 hours. Anemia Panel: No results for input(s): VITAMINB12, FOLATE, FERRITIN, TIBC, IRON, RETICCTPCT in the last  72 hours. Sepsis Labs: No results for input(s): PROCALCITON, LATICACIDVEN in the last 168 hours.  Recent Results (from the past 240 hour(s))  Respiratory Panel by PCR     Status: None   Collection Time: 09/21/16  1:00 PM  Result Value Ref Range Status   Adenovirus NOT DETECTED NOT DETECTED Final   Coronavirus 229E NOT DETECTED NOT DETECTED Final   Coronavirus HKU1 NOT DETECTED NOT DETECTED Final   Coronavirus NL63 NOT DETECTED NOT DETECTED Final   Coronavirus OC43 NOT DETECTED NOT DETECTED Final   Metapneumovirus NOT DETECTED NOT DETECTED Final   Rhinovirus / Enterovirus NOT DETECTED NOT DETECTED Final   Influenza A NOT DETECTED NOT DETECTED Final   Influenza B NOT DETECTED NOT DETECTED Final   Parainfluenza Virus 1 NOT DETECTED NOT DETECTED Final   Parainfluenza Virus 2 NOT DETECTED NOT DETECTED Final   Parainfluenza Virus 3 NOT DETECTED NOT DETECTED Final   Parainfluenza Virus 4 NOT DETECTED NOT DETECTED Final   Respiratory Syncytial Virus NOT DETECTED NOT DETECTED Final   Bordetella pertussis NOT DETECTED NOT DETECTED Final   Chlamydophila pneumoniae NOT DETECTED NOT DETECTED Final   Mycoplasma pneumoniae NOT DETECTED NOT DETECTED Final    Comment: Performed at [facility], [address], [place], [place] [address]    Radiology Studies: No results found. Scheduled Meds: . amLODipine  10 mg Oral Daily  . budesonide (PULMICORT) nebulizer solution  0.5 mg Nebulization BID  . dextromethorphan-guaiFENesin  2 tablet Oral BID  . enoxaparin (LOVENOX) injection  40 mg Subcutaneous Q24H  . feeding supplement  1 Container Oral TID BM  . ipratropium-albuterol  3 mL  Nebulization Q6H  . irbesartan  37.5 mg Oral Daily  . levofloxacin  500 mg Oral Daily  . methylPREDNISolone (SOLU-MEDROL) injection  80 mg Intravenous Q12H  . nicotine  14 mg Transdermal Daily  . pantoprazole  40 mg Oral BID AC  . polyethylene glycol  17 g Oral Daily  . sodium chloride flush  3 mL Intravenous Q12H   Continuous Infusions:   LOS: 6 days   Merlene Laughter, DO [facility] Pager [phone]  If 7PM-7AM, please contact night-coverage [email or web address] Password [facility] 09/25/2016, 12:51 PM

## 2016-09-26 ENCOUNTER — Inpatient Hospital Stay (HOSPITAL_COMMUNITY): Payer: Medicaid Other

## 2016-09-26 DIAGNOSIS — J9601 Acute respiratory failure with hypoxia: Secondary | ICD-10-CM

## 2016-09-26 LAB — CBC WITH DIFFERENTIAL/PLATELET
Basophils Absolute: 0 10*3/uL (ref 0.0–0.1)
Basophils Relative: 0 %
Eosinophils Absolute: 0 10*3/uL (ref 0.0–0.7)
Eosinophils Relative: 0 %
HEMATOCRIT: 39.3 % (ref 39.0–52.0)
HEMOGLOBIN: 12.7 g/dL — AB (ref 13.0–17.0)
LYMPHS ABS: 1 10*3/uL (ref 0.7–4.0)
Lymphocytes Relative: 16 %
MCH: 23.1 pg — AB (ref 26.0–34.0)
MCHC: 32.3 g/dL (ref 30.0–36.0)
MCV: 71.6 fL — AB (ref 78.0–100.0)
MONOS PCT: 13 %
Monocytes Absolute: 0.9 10*3/uL (ref 0.1–1.0)
NEUTROS ABS: 4.7 10*3/uL (ref 1.7–7.7)
NEUTROS PCT: 71 %
Platelets: 224 10*3/uL (ref 150–400)
RBC: 5.49 MIL/uL (ref 4.22–5.81)
RDW: 15.5 % (ref 11.5–15.5)
WBC: 6.6 10*3/uL (ref 4.0–10.5)

## 2016-09-26 LAB — PHOSPHORUS: Phosphorus: 3.6 mg/dL (ref 2.5–4.6)

## 2016-09-26 LAB — COMPREHENSIVE METABOLIC PANEL
ALK PHOS: 33 U/L — AB (ref 38–126)
ALT: 16 U/L — ABNORMAL LOW (ref 17–63)
ANION GAP: 7 (ref 5–15)
AST: 13 U/L — ABNORMAL LOW (ref 15–41)
Albumin: 3.2 g/dL — ABNORMAL LOW (ref 3.5–5.0)
BILIRUBIN TOTAL: 0.5 mg/dL (ref 0.3–1.2)
BUN: 26 mg/dL — ABNORMAL HIGH (ref 6–20)
CALCIUM: 8.7 mg/dL — AB (ref 8.9–10.3)
CO2: 34 mmol/L — ABNORMAL HIGH (ref 22–32)
Chloride: 98 mmol/L — ABNORMAL LOW (ref 101–111)
Creatinine, Ser: 0.59 mg/dL — ABNORMAL LOW (ref 0.61–1.24)
GLUCOSE: 109 mg/dL — AB (ref 65–99)
Potassium: 4.3 mmol/L (ref 3.5–5.1)
Sodium: 139 mmol/L (ref 135–145)
TOTAL PROTEIN: 5.6 g/dL — AB (ref 6.5–8.1)

## 2016-09-26 LAB — MAGNESIUM: Magnesium: 2.2 mg/dL (ref 1.7–2.4)

## 2016-09-26 MED ORDER — NICOTINE 14 MG/24HR TD PT24: 14.0000 mg | MEDICATED_PATCH | Freq: Every day | TRANSDERMAL | 0 refills | Status: DC

## 2016-09-26 MED ORDER — MORPHINE SULFATE 15 MG PO TABS: 7.5000 mg | ORAL_TABLET | ORAL | 0 refills | Status: DC | PRN

## 2016-09-26 MED ORDER — ALBUTEROL SULFATE HFA 108 (90 BASE) MCG/ACT IN AERS: 2.0000 | INHALATION_SPRAY | Freq: Four times a day (QID) | RESPIRATORY_TRACT | 2 refills | Status: DC | PRN

## 2016-09-26 MED ORDER — ALBUTEROL SULFATE (2.5 MG/3ML) 0.083% IN NEBU: 2.5000 mg | INHALATION_SOLUTION | Freq: Four times a day (QID) | RESPIRATORY_TRACT | 0 refills | Status: DC

## 2016-09-26 MED ORDER — PANTOPRAZOLE SODIUM 40 MG PO TBEC: 40.0000 mg | DELAYED_RELEASE_TABLET | Freq: Every day | ORAL | 0 refills | Status: DC

## 2016-09-26 MED ORDER — HYDROXYZINE HCL 10 MG PO TABS: 10.0000 mg | ORAL_TABLET | Freq: Three times a day (TID) | ORAL | 0 refills | Status: DC | PRN

## 2016-09-26 MED ORDER — PREDNISONE 10 MG PO TABS: 10.0000 mg | ORAL_TABLET | ORAL | 0 refills | Status: DC

## 2016-09-26 MED ORDER — BOOST / RESOURCE BREEZE PO LIQD: 1.0000 | Freq: Three times a day (TID) | ORAL | 0 refills | Status: DC

## 2016-09-26 MED ORDER — IPRATROPIUM BROMIDE 0.02 % IN SOLN: 0.5000 mg | Freq: Four times a day (QID) | RESPIRATORY_TRACT | 0 refills | Status: DC

## 2016-09-26 MED ORDER — FLUTICASONE-SALMETEROL 250-50 MCG/DOSE IN AEPB: 1.0000 | INHALATION_SPRAY | Freq: Two times a day (BID) | RESPIRATORY_TRACT | 0 refills | Status: DC

## 2016-09-26 MED ORDER — IPRATROPIUM BROMIDE 0.02 % IN SOLN: 0.5000 mg | RESPIRATORY_TRACT | 0 refills | Status: DC | PRN

## 2016-09-26 MED ORDER — DM-GUAIFENESIN ER 30-600 MG PO TB12: 2.0000 | ORAL_TABLET | Freq: Two times a day (BID) | ORAL | 0 refills | Status: DC

## 2016-09-26 MED ORDER — SENNOSIDES-DOCUSATE SODIUM 8.6-50 MG PO TABS: 1.0000 | ORAL_TABLET | Freq: Two times a day (BID) | ORAL | 0 refills | Status: DC

## 2016-09-26 MED ORDER — POLYETHYLENE GLYCOL 3350 17 G PO PACK: 17.0000 g | PACK | Freq: Every day | ORAL | 0 refills | Status: DC

## 2016-09-26 MED ORDER — AMLODIPINE BESYLATE 10 MG PO TABS: 10.0000 mg | ORAL_TABLET | Freq: Every day | ORAL | 0 refills | Status: DC

## 2016-09-26 MED ORDER — ALBUTEROL SULFATE (2.5 MG/3ML) 0.083% IN NEBU: 2.5000 mg | INHALATION_SOLUTION | Freq: Four times a day (QID) | RESPIRATORY_TRACT | 0 refills | Status: DC | PRN

## 2016-09-26 MED ORDER — BUDESONIDE 0.5 MG/2ML IN SUSP: 0.5000 mg | Freq: Two times a day (BID) | RESPIRATORY_TRACT | 0 refills | Status: DC

## 2016-09-26 MED ORDER — IRBESARTAN 75 MG PO TABS: 37.5000 mg | ORAL_TABLET | Freq: Every day | ORAL | 0 refills | Status: DC

## 2016-09-26 MED ORDER — METHYLPREDNISOLONE SODIUM SUCC 40 MG IJ SOLR: 40.0000 mg | Freq: Two times a day (BID) | INTRAMUSCULAR | Status: DC

## 2016-09-26 NOTE — Discharge Summary (Signed)
Physician Discharge Summary  Aitan Rossbach ZOX:[id number] DOB: 1955/04/10 DOA: 09/19/2016  PCP: Patient, No Pcp Per  Admit date: 09/19/2016 Discharge date: 09/26/2016  Admitted From: Home Disposition:  Home with Home Health RN  Recommendations for Outpatient Follow-up:  1. Follow up with and establish with PCP in 1-2 weeks on July 2nd, 18 at 10:00 [facility] 2. Follow up with Pulmonary Dr. Sherene Sires on 10/06/16 at 1:30 pm 3. Please obtain CMP/CBC, Mag Phos in one week  Home Health: YES RN Equipment/Devices: None   Discharge Condition: Stable CODE STATUS: FULL CODE Diet recommendation: Heart Healthy Diet  Brief/Interim Summary: Aldyn Fordis a 61 y.o.malewith a past medical history of Emphysema, Hypertension, Arthritis, HLD and other comorbids who moved to [place] in March from [place]. He has been out of his medications for a few months. He's had 2 visits to the emergency department for shortness of breath over the last 1 month. He was in his usual state of health about a couple days ago when he started developing shortness of breath with wheezing. Has had a cough. It has been dry. Denies any fever. No nausea, vomiting. Has had some chest tightness, but mainly with coughing. Symptoms are not getting better despite treatment at home with nebulizer treatments and so he decided to come into the hospital. In the emergency department, patient was given a breathing treatments and admitted for Hypoxia that was refractory to the Steroid Neb Treatments given in the ED. He was slowly improving and still feels SOB so a CT Chest Angio was ordered to r/o PE as patient was still tachycardic. CT showed noPE. Patient was slowly improving so Pulmonary was consulted for further management and evaluation for additional recommendations and they adjusted some of his medications. Palliative Care consulted for Symptom management at the recommendation of Pulmonary for his Severe/End Stage Lung Disease.  Palliative Care placed the patient on po Morphine for Dyspnea and it helped so Morphine was increased to 7.5 mg po q4hprn. Patient improved and Pulmonary gave the ok to D/C home and will have patient follow up with PCP and Pulmonary as an outpatient.    Discharge Diagnoses:  Principal Problem:   COPD with acute exacerbation (HCC) Active Problems:   Acute respiratory failure with hypoxia (HCC)   Emphysema of lung (HCC)   Essential hypertension   Hypercholesterolemia   COPD exacerbation (HCC)  Acute Respiratory Failure with Hypoxia likely 2/2 to Severe COPD -Patient desaturated to 88% on Room Air while Ambulating  -C/w Supplemental O2 and Wean As tolerated -C/w Continuous Pulse Oximetry and Maintain O2 Saturations >92% -Repeat CXR as an outpatient  -Obtained CT Chest Angio to rule out Pulmonary Embolus and did not show any PE -Pulmonary Consulted and Appreciate Dr. Sood's/Wert's recommendations -Walk Screen qualified patient for O2; Will send on Home O2  COPD with Acute exacerbation [facility]): -Patient continues to have shortness of breath when talking, moving and changing positions. He is on 2L of oxygen and saturation is in the mid 90's on rest and needs increased O2 and 3-4 Liters ambulating.  -Continue Supplemental O2, C/w Albuterol 2.5 mg Neb qid and Ipratropium 0.5 mg Neb qid and Albuterol 2.5 mg Neb q2hprn for wheezing, Levofloxacin 500 mg po Daily (Day 7/7) Discontinued Yesterday  -Changed  Methylprednisolone 80 mg IV q12h to 40 mg IV q12h: Start Steroid Taper per Pulm Reccs: Take 40mg  po daily for 3 days, then take 30mg  po daily for 3 days, then take 20mg  po daily  for two days, then take 10mg  po daily for 2 days -C/w Protonix 40 mg po Daily  -C/w Budesonide 0.25 mg Neb RT BID at D/C; Arformoterol Neb 15 mcg D/C'd by Pulmonary -C/w Dextromethorphan-Guaifenesin 30-600 2 Tablet po BID by Pulmonary  -Added Flutter Valve -NaCl Hypertonic Nebs 4 mL Daily discontinued  -C/w Incentive  Spirometry and added Hydroxazine 10 mg po TIDprn for anxiety associated with breathing  -Pulmonary Added Umeclidinium Bromide 1 puff IH Daily yesterday but D/C'd 09/24/16 by Dr. Sherene Sires because ACEi causing intolerance to DPI inhibitor's   -Checked Respiratory Virus Panel and was Negative and patient is no longer on Droplet Precautions -Chest CT Angio for PE was Negative for PE -Consulted Pulmonary for further Recc's and appreciated Recc's -Pulmonary stating patient may benefit from Palliative Care Assessment; Palliative Care Medicine consulted for symptom management -Palliative Care placed the patient on po Morphine 5 mg po q4hprn and increased it to 7.5 mg po q4hprn for WOB and Dyspnea  -C/w po Morphine at D/C -C/w PCP and with Pulmonary at D/C; Started Albuterol Inhaler at D/C  Emphysema of lung (HCC) -CXR indicated some changes in the right upper lobe and a CT was ordered.  -CT Results showed scarring of the right upper lobe and emphysema of the lungs. No nodules or pneumonia suspected.  -As above;  -Repeated the Chest CT and doing it with Contrast to r/o PE and showed No pulmonary Embolus or Acute Aortic Syndrome but did show Aortic Atherosclerosis and severe Emphysema -As Above -Follow up with Pulmonary Dr. Sherene Sires 10/06/16 at 1:30 pm   Urinary Urgency -For 1 week patient has been experiencing the need to urinate but voiding only a small amount. Denies other urinary symptoms.  -PSA was normal and Urinalysis Negative.  -Improved; Follow up with PCP as an outpatient   Changes in bowel habits concern for Constipation: -Patient has been experiencing the need to void but states that for about a week, "only water comes out" and when he looks in the toilet, the discharge appears similar to mucous. States he had 2 normal bowel movements in the last week. Suspect there may be some constipation.  -C/w Miralax 17 grams po Daily and Senna-Docusate 1 tab po BID now that patient will be on Morphine for  Dyspnea  -Continue to Monitor as he had a normal bowel movement yesterday  Essential Hypertension: -Patient had run out of home BP meds as well, admission BP was 179/128, trending down and currently is 129/81 -C/w Amlodipine 10 mg po Daily -Pulmonary changed Patient's Home Lisinopril 10 mg po Daily to Irbesartan 37.5 mg po Daily permantly because of Cough and will continue Irbesartan at D/C -Per Pulmonary the only way to prove the symptoms are not from ACEi is to be off of ACEi for 6 weeks  -Establish with and follow up with PCP  Moderate Malnutrition in the Context of Chronic illness -Nutritionist Consulted and Recc's Appreciated -C/w Boost Breeze 1 Container po TID  Tobacco Abuse/Smoker -Smoking Cessation Counseling given -Nicotine 14 mg TD q24h script given  Discharge Instructions  Discharge Instructions    Call MD for:  difficulty breathing, headache or visual disturbances    Complete by:  As directed    Call MD for:  extreme fatigue    Complete by:  As directed    Call MD for:  hives    Complete by:  As directed    Call MD for:  persistant dizziness or light-headedness    Complete by:  As directed    Call MD for:  persistant nausea and vomiting    Complete by:  As directed    Call MD for:  severe uncontrolled pain    Complete by:  As directed    Call MD for:  temperature >100.4    Complete by:  As directed    Diet - low sodium heart healthy    Complete by:  As directed    Discharge instructions    Complete by:  As directed    Follow up with PCP and with Pulmonology as an outpatient. Take all medications as prescribed. If symptoms change or worsen please return to the ED for evaluation.   Increase activity slowly    Complete by:  As directed      Allergies as of 09/26/2016      Reactions   Norflex [orphenadrine] Hives      Medication List    STOP taking these medications   Fluticasone-Salmeterol 250-50 MCG/DOSE Aepb Commonly known as:  ADVAIR DISKUS    lisinopril 10 MG tablet Commonly known as:  PRINIVIL,ZESTRIL     TAKE these medications   albuterol (2.5 MG/3ML) 0.083% nebulizer solution Commonly known as:  PROVENTIL Take 3 mLs (2.5 mg total) by nebulization 4 (four) times daily. What changed:  when to take this  reasons to take this   albuterol 108 (90 Base) MCG/ACT inhaler Commonly known as:  PROVENTIL HFA;VENTOLIN HFA Inhale 2 puffs into the lungs every 6 (six) hours as needed for wheezing or shortness of breath. What changed:  You were already taking a medication with the same name, and this prescription was added. Make sure you understand how and when to take each.   amLODipine 10 MG tablet Commonly known as:  NORVASC Take 1 tablet (10 mg total) by mouth daily. What changed:  medication strength  how much to take   budesonide 0.5 MG/2ML nebulizer solution Commonly known as:  PULMICORT Take 2 mLs (0.5 mg total) by nebulization 2 (two) times daily.   dextromethorphan-guaiFENesin 30-600 MG 12hr tablet Commonly known as:  MUCINEX DM Take 2 tablets by mouth 2 (two) times daily.   feeding supplement Liqd Take 1 Container by mouth 3 (three) times daily between meals.   hydrOXYzine 10 MG tablet Commonly known as:  ATARAX/VISTARIL Take 1 tablet (10 mg total) by mouth 3 (three) times daily as needed for anxiety.   ipratropium 0.02 % nebulizer solution Commonly known as:  ATROVENT Take 2.5 mLs (0.5 mg total) by nebulization 4 (four) times daily. What changed:  when to take this  reasons to take this   irbesartan 75 MG tablet Commonly known as:  AVAPRO Take 0.5 tablets (37.5 mg total) by mouth daily.   morphine 15 MG tablet Commonly known as:  MSIR Take 0.5 tablets (7.5 mg total) by mouth every 4 (four) hours as needed (shortness of breath).   nicotine 14 mg/24hr patch Commonly known as:  NICODERM CQ - dosed in mg/24 hours Place 1 patch (14 mg total) onto the skin daily.   pantoprazole 40 MG  tablet Commonly known as:  PROTONIX Take 1 tablet (40 mg total) by mouth daily.   polyethylene glycol packet Commonly known as:  MIRALAX / GLYCOLAX Take 17 g by mouth daily.   predniSONE 10 MG tablet Commonly known as:  DELTASONE Take 1 tablet (10 mg total) by mouth See admin instructions. 40mg  po daily for 3 days, then take 30mg  po daily for 3 days, then take 20mg   po daily for two days, then take 10mg  po daily for 2 days and then Stop. What changed:  medication strength  how much to take  when to take this  additional instructions   senna-docusate 8.6-50 MG tablet Commonly known as:  Senokot-S Take 1 tablet by mouth 2 (two) times daily.      Follow-up Information    [facility] Follow up on 10/03/2016.   Specialty:  Internal Medicine Why:  appointment July 2 at 10:00 AM, please keep appointment, Contact information: [address] [place] [address] [phone]       Nyoka Cowden, MD. Go on 10/06/2016.   Specialty:  Pulmonary Disease Why:  Appointment scheduled for you with Dr. Sherene Sires on 10/06/2016 at 1:30 pm Contact information: [address] [place] [address] [phone]          Allergies  Allergen Reactions  . Norflex [Orphenadrine] Hives    Consultations:  Pulmonary Dr. Craige Cotta /Dr. Sherene Sires /Dr. [facility]  Palliative Care Medicine Dr. Romie Minus  Procedures/Studies: Dg Chest 2 View  Result Date: 09/19/2016 CLINICAL DATA:  Increasing shortness of breath. EXAM: CHEST  2 VIEW COMPARISON:  Most recent comparison radiograph 09/03/2016, most remote CT 08/03/2016 FINDINGS: Again seen hyperinflation and emphysema. Irregular opacity in the right upper lung is stable from prior exams. Normal heart size and mediastinal contours. No pulmonary edema, focal airspace disease, pleural fluid or pneumothorax. No acute osseous abnormalities. IMPRESSION: 1. Chronic hyperinflation and emphysema consistent with COPD. 2. Probable right  upper lobe scarring, stable from prior exams, however incompletely characterized radiographically. In absence of more remote comparisons, consider nonemergent chest CT characterization to exclude presence of underline pulmonary nodule. Electronically Signed   By: Rubye Oaks M.D.   On: 09/19/2016 06:25   Dg Chest 2 View  Result Date: 09/03/2016 CLINICAL DATA:  Acute onset of shortness of breath. Initial encounter. EXAM: CHEST  2 VIEW COMPARISON:  Chest radiograph performed 08/23/2016 FINDINGS: The lungs are hyperexpanded, with flattening of the hemidiaphragms compatible with COPD. Minimal scarring is again noted at the right upper lung zone. There is no evidence of pleural effusion or pneumothorax. The heart is normal in size; the mediastinal contour is within normal limits. No acute osseous abnormalities are seen. IMPRESSION: Findings of COPD.  Lungs otherwise clear. Electronically Signed   By: Roanna Raider M.D.   On: 09/03/2016 02:30   Ct Chest Wo Contrast  Result Date: 09/19/2016 CLINICAL DATA:  Followup abnormal chest x-ray. Possible pulmonary nodule. History of emphysema. EXAM: CT CHEST WITHOUT CONTRAST TECHNIQUE: Multidetector CT imaging of the chest was performed following the standard protocol without IV contrast. COMPARISON:  Chest x-ray 09/19/2016 FINDINGS: Cardiovascular: The heart is normal in size. No pericardial effusion. No pericardial effusion. Mild tortuosity, ectasia and calcification of the thoracic aorta and branch vessels. Coronary artery calcifications are noted. Mediastinum/Nodes: No mediastinal or hilar mass or lymphadenopathy. The esophagus is grossly normal. Lungs/Pleura: Advanced emphysematous changes involving the lungs with areas of pulmonary scarring. The right upper lobe density has the appearance of scarring change. No discrete mass or pulmonary lesion is identified. No worrisome pulmonary nodules and no acute overlying pulmonary process. No pleural effusion. Upper  Abdomen: No significant upper abdominal findings. Chest wall/ Musculoskeletal: No chest wall mass, supraclavicular or axillary adenopathy. Small scattered lymph nodes are noted. The thyroid gland is grossly normal. No significant bony findings. IMPRESSION: 1. Advanced emphysematous changes and pulmonary scarring. The abnormality on the chest  x-ray is consistent with scarring. No worrisome pulmonary lesions, masses or nodules. 2. No acute pulmonary findings. 3. Age advanced atherosclerotic calcifications involving the aorta and coronary arteries. 4. No mediastinal or hilar mass or adenopathy. Aortic Atherosclerosis (ICD10-I70.0) and Emphysema (ICD10-J43.9). Electronically Signed   By: Rudie MeyerP.  Gallerani M.D.   On: 09/19/2016 11:45   Ct Angio Chest Pe W Or Wo Contrast  Result Date: 09/22/2016 CLINICAL DATA:  Shortness of breath EXAM: CT ANGIOGRAPHY CHEST WITH CONTRAST TECHNIQUE: Multidetector CT imaging of the chest was performed using the standard protocol during bolus administration of intravenous contrast. Multiplanar CT image reconstructions and MIPs were obtained to evaluate the vascular anatomy. CONTRAST:  100 mL Isovue 370 COMPARISON:  Chest CT 09/19/2016 FINDINGS: Cardiovascular: Contrast injection is sufficient to demonstrate satisfactory opacification of the pulmonary arteries to the segmental level. There is no pulmonary embolus. The main pulmonary artery is within normal limits for size. There is no CT evidence of acute right heart strain. There is calcific aortic atherosclerosis. There is a normal 3-vessel arch branching pattern. Heart size is normal, without pericardial effusion. There are coronary artery calcifications. Mediastinum/Nodes: No mediastinal, hilar or axillary lymphadenopathy. The visualized thyroid and thoracic esophageal course are unremarkable. Lungs/Pleura: There is severe emphysema bilaterally. No focal consolidation or pulmonary edema. No masses or pulmonary nodules. No pleural  effusion. Multiple nodular opacities along the tracheal lumen. Upper Abdomen: Contrast bolus timing is not optimized for evaluation of the abdominal organs. Within this limitation, the visualized organs of the upper abdomen are normal. Musculoskeletal: No chest wall abnormality. No acute or significant osseous findings. Review of the MIP images confirms the above findings. IMPRESSION: 1. No pulmonary embolus or acute aortic syndrome. 2. Aortic Atherosclerosis (ICD10-I70.0) and severe Emphysema (ICD10-J43.9). 3. Nodular opacities along the lower tracheal lumen, probably secretions, given their absence on the recent study. Electronically Signed   By: Deatra RobinsonKevin  Herman M.D.   On: 09/22/2016 19:10   Dg Chest Port 1 View  Result Date: 09/26/2016 CLINICAL DATA:  Shortness of breath EXAM: PORTABLE CHEST 1 VIEW COMPARISON:  09/19/2016 FINDINGS: Heart size is normal. Mediastinal shadows are normal. Chronic emphysema with pulmonary hyperinflation and upper lobe scarring worse on the right. No sign of infiltrate or collapse. No effusions. No acute bone finding. IMPRESSION: Chronic emphysema and pulmonary scarring.  No active disease. Electronically Signed   By: Paulina FusiMark  Shogry M.D.   On: 09/26/2016 06:58     Subjective: Seen and examined and was still a little SOB and had some wheezing but felt better than coming in. States po Morphine is helping. No other complaints or concerns and will go home on Oxygen  Discharge Exam: Vitals:   09/25/16 2228 09/26/16 0614  BP: 113/81 113/84  Pulse: (!) 102 80  Resp: 18 17  Temp: 97.8 F (36.6 C) 97.9 F (36.6 C)   Vitals:   09/26/16 0243 09/26/16 0614 09/26/16 0755 09/26/16 0756  BP:  113/84    Pulse:  80    Resp:  17    Temp:  97.9 F (36.6 C)    TempSrc:  Oral    SpO2: 93% 98% 98% 98%  Weight:      Height:       General: Pt is alert, awake, not in acute distress Cardiovascular: RRR, S1/S2 +, no rubs, no gallops Respiratory: Diminished bilaterally with some  wheezing, no rhonchi Abdominal: Soft, NT, ND, bowel sounds + Extremities: no edema, no cyanosis  The results of significant diagnostics from this hospitalization (  including imaging, microbiology, ancillary and laboratory) are listed below for reference.    Microbiology: Recent Results (from the past 240 hour(s))  Respiratory Panel by PCR     Status: None   Collection Time: 09/21/16  1:00 PM  Result Value Ref Range Status   Adenovirus NOT DETECTED NOT DETECTED Final   Coronavirus 229E NOT DETECTED NOT DETECTED Final   Coronavirus HKU1 NOT DETECTED NOT DETECTED Final   Coronavirus NL63 NOT DETECTED NOT DETECTED Final   Coronavirus OC43 NOT DETECTED NOT DETECTED Final   Metapneumovirus NOT DETECTED NOT DETECTED Final   Rhinovirus / Enterovirus NOT DETECTED NOT DETECTED Final   Influenza A NOT DETECTED NOT DETECTED Final   Influenza B NOT DETECTED NOT DETECTED Final   Parainfluenza Virus 1 NOT DETECTED NOT DETECTED Final   Parainfluenza Virus 2 NOT DETECTED NOT DETECTED Final   Parainfluenza Virus 3 NOT DETECTED NOT DETECTED Final   Parainfluenza Virus 4 NOT DETECTED NOT DETECTED Final   Respiratory Syncytial Virus NOT DETECTED NOT DETECTED Final   Bordetella pertussis NOT DETECTED NOT DETECTED Final   Chlamydophila pneumoniae NOT DETECTED NOT DETECTED Final   Mycoplasma pneumoniae NOT DETECTED NOT DETECTED Final    Comment: Performed at [facility], [address] [place], [place] [address]    Labs: BNP (last 3 results) No results for input(s): BNP in the last 8760 hours. Basic Metabolic Panel:  Recent Labs Lab 09/22/16 0539 09/23/16 0511 09/24/16 0523 09/25/16 0802 09/26/16 0559  NA 138 138 139 139 139  K 3.9 3.8 3.8 4.2 4.3  CL 100* 98* 97* 99* 98*  CO2 30 31 33* 35* 34*  GLUCOSE 134* 169* 168* 104* 109*  BUN 15 20 17  24* 26*  CREATININE 0.56* 0.58* 0.59* 0.51* 0.59*  CALCIUM 8.8* 8.8* 9.0 8.6* 8.7*  MG 2.3 2.2 2.2 2.3 2.2  PHOS 3.9 4.2 4.0 3.7 3.6   Liver  Function Tests:  Recent Labs Lab 09/22/16 0539 09/23/16 0511 09/24/16 0523 09/25/16 0802 09/26/16 0559  AST 18 18 20  13* 13*  ALT 15* 16* 16* 15* 16*  ALKPHOS 31* 35* 36* 32* 33*  BILITOT 0.3 0.5 0.5 0.7 0.5  PROT 6.0* 6.2* 6.0* 5.7* 5.6*  ALBUMIN 3.3* 3.3* 3.3* 3.1* 3.2*   No results for input(s): LIPASE, AMYLASE in the last 168 hours. No results for input(s): AMMONIA in the last 168 hours. CBC:  Recent Labs Lab 09/22/16 0539 09/23/16 0511 09/24/16 0523 09/25/16 0802 09/26/16 0559  WBC 7.4 7.1 7.3 6.9 6.6  NEUTROABS 6.2 6.1 5.7 5.0 4.7  HGB 12.4* 13.0 13.2 12.4* 12.7*  HCT 38.5* 40.5 41.2 38.2* 39.3  MCV 71.3* 72.5* 72.8* 72.1* 71.6*  PLT 227 245 255 234 224   Cardiac Enzymes: No results for input(s): CKTOTAL, CKMB, CKMBINDEX, TROPONINI in the last 168 hours. BNP: Invalid input(s): POCBNP CBG: No results for input(s): GLUCAP in the last 168 hours. D-Dimer No results for input(s): DDIMER in the last 72 hours. Hgb A1c No results for input(s): HGBA1C in the last 72 hours. Lipid Profile No results for input(s): CHOL, HDL, LDLCALC, TRIG, CHOLHDL, LDLDIRECT in the last 72 hours. Thyroid function studies No results for input(s): TSH, T4TOTAL, T3FREE, THYROIDAB in the last 72 hours.  Invalid input(s): FREET3 Anemia work up No results for input(s): VITAMINB12, FOLATE, FERRITIN, TIBC, IRON, RETICCTPCT in the last 72 hours. Urinalysis    Component Value Date/Time   COLORURINE STRAW (A) 09/20/2016 1216   APPEARANCEUR CLEAR 09/20/2016 1216  LABSPEC 1.006 09/20/2016 1216   PHURINE 6.0 09/20/2016 1216   GLUCOSEU NEGATIVE 09/20/2016 1216   HGBUR NEGATIVE 09/20/2016 1216   BILIRUBINUR NEGATIVE 09/20/2016 1216   KETONESUR NEGATIVE 09/20/2016 1216   PROTEINUR NEGATIVE 09/20/2016 1216   NITRITE NEGATIVE 09/20/2016 1216   LEUKOCYTESUR NEGATIVE 09/20/2016 1216   Sepsis Labs Invalid input(s): PROCALCITONIN,  WBC,  LACTICIDVEN Microbiology Recent Results (from the past  240 hour(s))  Respiratory Panel by PCR     Status: None   Collection Time: 09/21/16  1:00 PM  Result Value Ref Range Status   Adenovirus NOT DETECTED NOT DETECTED Final   Coronavirus 229E NOT DETECTED NOT DETECTED Final   Coronavirus HKU1 NOT DETECTED NOT DETECTED Final   Coronavirus NL63 NOT DETECTED NOT DETECTED Final   Coronavirus OC43 NOT DETECTED NOT DETECTED Final   Metapneumovirus NOT DETECTED NOT DETECTED Final   Rhinovirus / Enterovirus NOT DETECTED NOT DETECTED Final   Influenza A NOT DETECTED NOT DETECTED Final   Influenza B NOT DETECTED NOT DETECTED Final   Parainfluenza Virus 1 NOT DETECTED NOT DETECTED Final   Parainfluenza Virus 2 NOT DETECTED NOT DETECTED Final   Parainfluenza Virus 3 NOT DETECTED NOT DETECTED Final   Parainfluenza Virus 4 NOT DETECTED NOT DETECTED Final   Respiratory Syncytial Virus NOT DETECTED NOT DETECTED Final   Bordetella pertussis NOT DETECTED NOT DETECTED Final   Chlamydophila pneumoniae NOT DETECTED NOT DETECTED Final   Mycoplasma pneumoniae NOT DETECTED NOT DETECTED Final    Comment: Performed at [facility], [address], [place], [place] [address]   Time coordinating discharge: 35 minutes  SIGNED:  Merlene Laughter, DO [facility] 09/26/2016, 8:58 PM Pager [phone]  If 7PM-7AM, please contact night-coverage [email or web address] Password [email or web address]

## 2016-09-26 NOTE — Progress Notes (Signed)
SATURATION QUALIFICATIONS: (This note is used to comply with regulatory documentation for home oxygen)  Patient Saturations on Room Air at Rest = 93%  Patient Saturations on Room Air while Ambulating = 88%  Patient Saturations on 2 Liters of oxygen while Ambulating = 96%  Please briefly explain why patient needs home oxygen: 

## 2016-09-26 NOTE — Progress Notes (Signed)
[facility] P[facility]  Brief: moved here from [place] a few weeks back, has known COPD and has still been smoking as recent as last week.  Admitted with COPD flare, needed O2.  Subjective: feels so-so, has improved overall  O: Vitals:   09/26/16 0243 09/26/16 0614 09/26/16 0755 09/26/16 0756  BP:  113/84    Pulse:  80    Resp:  17    Temp:  97.9 F (36.6 C)    TempSrc:  Oral    SpO2: 93% 98% 98% 98%  Weight:      Height:       3L [place]  General:  Resting comfortably in bed HENT: NCAT OP clear PULM: CTA B, normal effort, barrel chest noted CV: RRR, no mgr GI: BS+, soft, nontender MSK: normal bulk and tone Neuro: awake, alert, no distress, MAEW  CBC    Component Value Date/Time   WBC 6.6 09/26/2016 0559   RBC 5.49 09/26/2016 0559   HGB 12.7 (L) 09/26/2016 0559   HCT 39.3 09/26/2016 0559   PLT 224 09/26/2016 0559   MCV 71.6 (L) 09/26/2016 0559   MCH 23.1 (L) 09/26/2016 0559   MCHC 32.3 09/26/2016 0559   RDW 15.5 09/26/2016 0559   LYMPHSABS 1.0 09/26/2016 0559   MONOABS 0.9 09/26/2016 0559   EOSABS 0.0 09/26/2016 0559   BASOSABS 0.0 09/26/2016 0559   BMET    Component Value Date/Time   NA 139 09/26/2016 0559   K 4.3 09/26/2016 0559   CL 98 (L) 09/26/2016 0559   CO2 34 (H) 09/26/2016 0559   GLUCOSE 109 (H) 09/26/2016 0559   BUN 26 (H) 09/26/2016 0559   CREATININE 0.59 (L) 09/26/2016 0559   CALCIUM 8.7 (L) 09/26/2016 0559   GFRNONAA >60 09/26/2016 0559   GFRAA >60 09/26/2016 0559    Impression/plan:  Acute respiratory failure with hypoxemia: needs home O2 evaluation, then d/c home with supplemental O2 Acute exacerbation of COPD:  Ready for discharge: Would d/c home on: pulmicort bid (has nebulizer already at home), duoneb qid, prednisone Take 40mg  po daily for 3 days, then take 30mg  po daily for 3 days, then take 20mg  po daily for two days, then take 10mg  po daily for 2 days  Has follow up visit with Dr. Sherene SiresWert on Thursday July 5 at 1:30  PCCM will sign off  Heber CarolinaBrent  Tatjana Turcott, MD [place] P[facility]M Pager: [phone] Cell: [phone] After 3pm or if no response, call [phone]

## 2016-09-26 NOTE — Progress Notes (Signed)
Daily Progress Note   Patient Name: Matthew Morrison       Date: 09/26/2016 DOB: 12-04-1955  Age: 61 y.o. MRN#: [id number] Attending Physician: Kerney Elbe, DO Primary Care Physician: Patient, No Pcp Per Admit Date: 09/19/2016  Reason for Consultation/Follow-up: Non pain symptom management  Subjective: I met with Matthew Morrison this AM.  He continues to experience shortness of breath, worse with exertion.  He thinks that morphine he received has helped, but he is still not using it regularly.  He denies any side effects.  Discussed again that using medication 20-30 minutes prior to activities that cause him to become SOB may be beneficial.  See below  Length of Stay: 7  Current Medications: Scheduled Meds:  . amLODipine  10 mg Oral Daily  . budesonide (PULMICORT) nebulizer solution  0.5 mg Nebulization BID  . dextromethorphan-guaiFENesin  2 tablet Oral BID  . enoxaparin (LOVENOX) injection  40 mg Subcutaneous Q24H  . feeding supplement  1 Container Oral TID BM  . ipratropium-albuterol  3 mL Nebulization Q6H  . irbesartan  37.5 mg Oral Daily  . methylPREDNISolone (SOLU-MEDROL) injection  40 mg Intravenous Q12H  . nicotine  14 mg Transdermal Daily  . pantoprazole  40 mg Oral BID AC  . polyethylene glycol  17 g Oral Daily  . senna-docusate  1 tablet Oral BID  . sodium chloride flush  3 mL Intravenous Q12H    Continuous Infusions:   PRN Meds: albuterol, hydrOXYzine, morphine, [DISCONTINUED] ondansetron **OR** ondansetron (ZOFRAN) IV  Physical Exam         General: Alert, awake, in mild respiratory distress with some conversational dyspnea.  HEENT: No bruits, no goiter, no JVD Heart: Regular rate and rhythm. No murmur appreciated. Lungs: Diminished air movement, scattered  wheezes Abdomen: Soft, nontender, nondistended, positive bowel sounds.  Ext: No significant edema Skin: Warm and dry Neuro: Grossly intact, nonfocal.  Vital Signs: BP 113/84 (BP Location: Right Arm)   Pulse 80   Temp 97.9 F (36.6 C) (Oral)   Resp 17   Ht 5' 4"  (1.626 m)   Wt 49.5 kg (109 lb 2 oz)   SpO2 98%   BMI 18.73 kg/m  SpO2: SpO2: 98 % O2 Device: O2 Device: Nasal Cannula O2 Flow Rate: O2 Flow Rate (L/min): 3 L/min  Intake/output summary:  Intake/Output Summary (Last 24 hours) at 09/26/16 1347 Last data filed at 09/25/16 1932  Gross per 24 hour  Intake              120 ml  Output                0 ml  Net              120 ml   LBM: Last BM Date: 09/24/16 Baseline Weight: Weight: 49.5 kg (109 lb 2 oz) Most recent weight: Weight: 49.5 kg (109 lb 2 oz)       Palliative Assessment/Data:    Flowsheet Rows     Most Recent Value  Intake Tab  Referral Department  Hospitalist  Unit at Time of Referral  Med/Surg Unit  Palliative Care Primary Diagnosis  Pulmonary  Date Notified  09/24/16  Palliative Care Type  New Palliative care  Reason for referral  Non-pain Symptom  Date of Admission  09/19/16  Date first seen by Palliative Care  09/24/16  # of days Palliative referral response time  0 Day(s)  # of days IP prior to Palliative referral  5  Clinical Assessment  Palliative Performance Scale Score  50%  Psychosocial & Spiritual Assessment  Palliative Care Outcomes  Patient/Family meeting held?  Yes  Who was at the meeting?  Patient  Palliative Care Outcomes  Improved non-pain symptom therapy      Patient Active Problem List   Diagnosis Date Noted  . COPD with acute exacerbation ([place]) 09/19/2016  . Acute respiratory failure with hypoxia ([place]) 09/19/2016  . Emphysema of lung ([place]) 09/19/2016  . Essential hypertension 09/19/2016  . Hypercholesterolemia 09/19/2016  . COPD exacerbation ([place]) 09/19/2016    Palliative Care Assessment & Plan   Patient  Profile: 61 y.o. male  with past medical history of  admitted on 09/19/2016 with acute respiratory failure related to severe COPD.  Palliative consulted for symptom management of dyspnea.   Recommendations/Plan: - Dyspnea: Reports improvement in his dyspnea with morphine.  No side effects noted.  Would continue morphine 7.14m (1/2 of 167mtab) every 4 hours as needed.  - Constipation: Concern this will worsen with initiation of opioid therapy.  Continue Miralax.  Goals of Care and Additional Recommendations:  Limitations on Scope of Treatment: Full Scope Treatment  Code Status:    Code Status Orders        Start     Ordered   09/19/16 1005  Full code  Continuous     09/19/16 1004    Code Status History    Date Active Date Inactive Code Status Order ID Comments User Context   This patient has a current code status but no historical code status.       Prognosis:   Unable to determine  Discharge Planning:  Home with [facility] discussed with patient, Dr. ShAlfredia Fergusonbedside RN  Thank you for allowing the Palliative Medicine Team to assist in the care of this patient.  Total time: 20 minutes    Greater than 50%  of this time was spent counseling and coordinating care related to the above assessment and plan.  GeMicheline RoughMD  Please contact Palliative Medicine Team phone at [phone] questions and concerns.

## 2016-09-26 NOTE — Progress Notes (Signed)
Pt selected [facility] for HH needs. Referral given to in house rep.  

## 2016-10-03 ENCOUNTER — Ambulatory Visit (INDEPENDENT_AMBULATORY_CARE_PROVIDER_SITE_OTHER): Payer: Medicaid Other | Admitting: Family Medicine

## 2016-10-03 ENCOUNTER — Encounter: Payer: Self-pay | Admitting: Family Medicine

## 2016-10-03 VITALS — BP 130/98 | HR 96 | Temp 97.9°F | Resp 14 | Ht 64.0 in | Wt 102.0 lb

## 2016-10-03 DIAGNOSIS — R946 Abnormal results of thyroid function studies: Secondary | ICD-10-CM

## 2016-10-03 DIAGNOSIS — R634 Abnormal weight loss: Secondary | ICD-10-CM

## 2016-10-03 DIAGNOSIS — J439 Emphysema, unspecified: Secondary | ICD-10-CM

## 2016-10-03 DIAGNOSIS — Z1389 Encounter for screening for other disorder: Secondary | ICD-10-CM

## 2016-10-03 DIAGNOSIS — R7989 Other specified abnormal findings of blood chemistry: Secondary | ICD-10-CM

## 2016-10-03 LAB — COMPLETE METABOLIC PANEL WITH GFR
ALBUMIN: 4 g/dL (ref 3.6–5.1)
ALK PHOS: 50 U/L (ref 40–115)
ALT: 13 U/L (ref 9–46)
AST: 22 U/L (ref 10–35)
BILIRUBIN TOTAL: 0.4 mg/dL (ref 0.2–1.2)
BUN: 16 mg/dL (ref 7–25)
CO2: 23 mmol/L (ref 20–31)
CREATININE: 0.68 mg/dL — AB (ref 0.70–1.33)
Calcium: 9 mg/dL (ref 8.6–10.3)
Chloride: 96 mmol/L — ABNORMAL LOW (ref 98–110)
GFR, Est African American: >89 mL/min (ref >=60)
Glucose, Bld: 90 mg/dL (ref 65–99)
Potassium: 5.2 mmol/L (ref 3.5–5.3)
Sodium: 133 mmol/L — ABNORMAL LOW (ref 135–146)
TOTAL PROTEIN: 6.9 g/dL (ref 6.1–8.1)

## 2016-10-03 LAB — CBC WITH DIFFERENTIAL/PLATELET
BASOS PCT: 0 %
Basophils Absolute: 0 cells/uL (ref 0–200)
EOS PCT: 1 %
Eosinophils Absolute: 89 cells/uL (ref 15–500)
HCT: 41.7 % (ref 38.5–50.0)
HEMOGLOBIN: 13.5 g/dL (ref 13.2–17.1)
Lymphocytes Relative: 13 %
Lymphs Abs: 1157 cells/uL (ref 850–3900)
MCH: 23.7 pg — ABNORMAL LOW (ref 27.0–33.0)
MCHC: 32.4 g/dL (ref 32.0–36.0)
MCV: 73.2 fL — ABNORMAL LOW (ref 80.0–100.0)
MPV: 9 fL (ref 7.5–12.5)
Monocytes Absolute: 979 cells/uL — ABNORMAL HIGH (ref 200–950)
Monocytes Relative: 11 %
NEUTROS ABS: 6675 {cells}/uL (ref 1500–7800)
Neutrophils Relative %: 75 %
Platelets: 269 10*3/uL (ref 140–400)
RBC: 5.7 MIL/uL (ref 4.20–5.80)
RDW: 16.6 % — ABNORMAL HIGH (ref 11.0–15.0)
WBC: 8.9 10*3/uL (ref 3.8–10.8)

## 2016-10-03 LAB — THYROID PANEL WITH TSH
Free Thyroxine Index: 3.4 (ref 1.4–3.8)
T3 Uptake: 35 % (ref 22–35)
T4 TOTAL: 9.7 ug/dL (ref 4.5–12.0)
TSH: 0.36 mIU/L — ABNORMAL LOW (ref 0.40–4.50)

## 2016-10-03 LAB — POCT URINALYSIS DIP (DEVICE)
BILIRUBIN URINE: NEGATIVE
Glucose, UA: NEGATIVE mg/dL
HGB URINE DIPSTICK: NEGATIVE
KETONES UR: NEGATIVE mg/dL
Leukocytes, UA: NEGATIVE
NITRITE: NEGATIVE
PH: 7 (ref 5.0–8.0)
Protein, ur: 30 mg/dL — AB
Specific Gravity, Urine: 1.025 (ref 1.005–1.030)
Urobilinogen, UA: 1 mg/dL (ref 0.0–1.0)

## 2016-10-03 MED ORDER — HYDROXYZINE HCL 10 MG PO TABS
10.0000 mg | ORAL_TABLET | Freq: Three times a day (TID) | ORAL | 0 refills | Status: DC | PRN
Start: 2016-10-03 — End: 2016-12-23

## 2016-10-03 MED ORDER — ALBUTEROL SULFATE HFA 108 (90 BASE) MCG/ACT IN AERS: 2.0000 | INHALATION_SPRAY | Freq: Four times a day (QID) | RESPIRATORY_TRACT | 2 refills | Status: DC | PRN

## 2016-10-03 MED ORDER — IPRATROPIUM BROMIDE 0.02 % IN SOLN: 0.5000 mg | Freq: Four times a day (QID) | RESPIRATORY_TRACT | 0 refills | Status: DC

## 2016-10-03 MED ORDER — IRBESARTAN 75 MG PO TABS: 37.5000 mg | ORAL_TABLET | Freq: Every day | ORAL | 1 refills | Status: DC

## 2016-10-03 MED ORDER — AMLODIPINE BESYLATE 10 MG PO TABS: 10.0000 mg | ORAL_TABLET | Freq: Every day | ORAL | 2 refills | Status: DC

## 2016-10-03 MED ORDER — PANTOPRAZOLE SODIUM 40 MG PO TBEC: 40.0000 mg | DELAYED_RELEASE_TABLET | Freq: Every day | ORAL | 1 refills | Status: DC

## 2016-10-03 MED ORDER — BUDESONIDE 0.5 MG/2ML IN SUSP: 0.5000 mg | Freq: Two times a day (BID) | RESPIRATORY_TRACT | 0 refills | Status: DC

## 2016-10-03 MED ORDER — ALBUTEROL SULFATE (2.5 MG/3ML) 0.083% IN NEBU: 2.5000 mg | INHALATION_SOLUTION | Freq: Four times a day (QID) | RESPIRATORY_TRACT | 3 refills | Status: DC

## 2016-10-03 MED FILL — IRBESARTAN 75 MG TABLET: 75 | 30 days supply | Qty: 15 | Fill #0

## 2016-10-03 MED FILL — hydrOXYzine HCL 10 MG TABS: 10 | 30 days supply | Qty: 90 | Fill #0

## 2016-10-03 MED FILL — IPRATROPIUM BR 0.02% SOLN: 0.02 | 12 days supply | Qty: 5 | Fill #0

## 2016-10-03 MED FILL — ALBUTEROL 0.083% INHAL SOLN: (2.5 MG/3ML | 30 days supply | Qty: 360 | Fill #0

## 2016-10-03 MED FILL — BUDESONIDE 0.5 MG/2 ML SUSP: 0.5 | 30 days supply | Qty: 120 | Fill #0

## 2016-10-03 MED FILL — ?PANTOPRAZOLE SOD DR 40MG: 40 MG | 30 days supply | Qty: 30 | Fill #0

## 2016-10-03 MED FILL — ?AMLODIPINE BESYLATE 10 MG: 10 | 30 days supply | Qty: 30 | Fill #0

## 2016-10-03 MED FILL — !VENTOLIN HFA INHALER: 108 (90 BAS | 25 days supply | Qty: 18 | Fill #0

## 2016-10-03 NOTE — Patient Instructions (Addendum)
You will be notified of any abnormal  lab results.    Chronic Obstructive Pulmonary Disease Chronic obstructive pulmonary disease (COPD) is a common lung condition in which airflow from the lungs is limited. COPD is a general term that can be used to describe many different lung problems that limit airflow, including both chronic bronchitis and emphysema. If you have COPD, your lung function will probably never return to normal, but there are measures you can take to improve lung function and make yourself feel better. What are the causes?  Smoking (common).  Exposure to secondhand smoke.  Genetic problems.  Chronic inflammatory lung diseases or recurrent infections. What are the signs or symptoms?  Shortness of breath, especially with physical activity.  Deep, persistent (chronic) cough with a large amount of thick mucus.  Wheezing.  Rapid breaths (tachypnea).  Gray or bluish discoloration (cyanosis) of the skin, especially in your fingers, toes, or lips.  Fatigue.  Weight loss.  Frequent infections or episodes when breathing symptoms become much worse (exacerbations).  Chest tightness. How is this diagnosed? Your health care provider will take a medical history and perform a physical examination to diagnose COPD. Additional tests for COPD may include:  Lung (pulmonary) function tests.  Chest X-ray.  CT scan.  Blood tests.  How is this treated? Treatment for COPD may include:  Inhaler and nebulizer medicines. These help manage the symptoms of COPD and make your breathing more comfortable.  Supplemental oxygen. Supplemental oxygen is only helpful if you have a low oxygen level in your blood.  Exercise and physical activity. These are beneficial for nearly all people with COPD.  Lung surgery or transplant.  Nutrition therapy to gain weight, if you are underweight.  Pulmonary rehabilitation. This may involve working with a team of health care providers and  specialists, such as respiratory, occupational, and physical therapists.  Follow these instructions at home:  Take all medicines (inhaled or pills) as directed by your health care provider.  Avoid over-the-counter medicines or cough syrups that dry up your airway (such as antihistamines) and slow down the elimination of secretions unless instructed otherwise by your health care provider.  If you are a smoker, the most important thing that you can do is stop smoking. Continuing to smoke will cause further lung damage and breathing trouble. Ask your health care provider for help with quitting smoking. He or she can direct you to community resources or hospitals that provide support.  Avoid exposure to irritants such as smoke, chemicals, and fumes that aggravate your breathing.  Use oxygen therapy and pulmonary rehabilitation if directed by your health care provider. If you require home oxygen therapy, ask your health care provider whether you should purchase a pulse oximeter to measure your oxygen level at home.  Avoid contact with individuals who have a contagious illness.  Avoid extreme temperature and humidity changes.  Eat healthy foods. Eating smaller, more frequent meals and resting before meals may help you maintain your strength.  Stay active, but balance activity with periods of rest. Exercise and physical activity will help you maintain your ability to do things you want to do.  Preventing infection and hospitalization is very important when you have COPD. Make sure to receive all the vaccines your health care provider recommends, especially the pneumococcal and influenza vaccines. Ask your health care provider whether you need a pneumonia vaccine.  Learn and use relaxation techniques to manage stress.  Learn and use controlled breathing techniques as directed by your health  care provider. Controlled breathing techniques include: 1. Pursed lip breathing. Start by breathing in  (inhaling) through your nose for 1 second. Then, purse your lips as if you were going to whistle and breathe out (exhale) through the pursed lips for 2 seconds. 2. Diaphragmatic breathing. Start by putting one hand on your abdomen just above your waist. Inhale slowly through your nose. The hand on your abdomen should move out. Then purse your lips and exhale slowly. You should be able to feel the hand on your abdomen moving in as you exhale.  Learn and use controlled coughing to clear mucus from your lungs. Controlled coughing is a series of short, progressive coughs. The steps of controlled coughing are: 1. Lean your head slightly forward. 2. Breathe in deeply using diaphragmatic breathing. 3. Try to hold your breath for 3 seconds. 4. Keep your mouth slightly open while coughing twice. 5. Spit any mucus out into a tissue. 6. Rest and repeat the steps once or twice as needed. Contact a health care provider if:  You are coughing up more mucus than usual.  There is a change in the color or thickness of your mucus.  Your breathing is more labored than usual.  Your breathing is faster than usual. Get help right away if:  You have shortness of breath while you are resting.  You have shortness of breath that prevents you from: ? Being able to talk. ? Performing your usual physical activities.  You have chest pain lasting longer than 5 minutes.  Your skin color is more cyanotic than usual.  You measure low oxygen saturations for longer than 5 minutes with a pulse oximeter. This information is not intended to replace advice given to you by your health care provider. Make sure you discuss any questions you have with your health care provider. Document Released: 12/29/2004 Document Revised: 08/27/2015 Document Reviewed: 11/15/2012 Elsevier Interactive Patient Education  2017 [employer].

## 2016-10-03 NOTE — Progress Notes (Signed)
Patient ID: Matthew Morrison Dreibelbis, male    DOB: November 19, 1955, 61 y.o.   MRN: [id number]  PCP: Bing NeighborsHarris, Thurley Francesconi S, FNP  Chief Complaint  Patient presents with  . Establish Care  . Hospitalization Follow-up    COPD    Subjective:  HPI  Matthew Morrison Bettinger is a 61 y.o. male presents to establish care and hospital follow-up. Medical problems include: COPD, Emphysema, Current every day smoker, Hypertension, Cachexia   Hospital follow-up Annabelle HarmanDana was admitted to [facility] hospital with an acute exacerbation of COPD and was subsequently found to be in acute respiratory failure with hypoxia. He remained hospitalized for 7 days. [place] [facility] and had been without his medications since arriving here. He reports being diagnosed with emphysema around 2010. COPD exacerbation are typically managed with nebulizer treatments, inhalers, and periodic doses of prednisone. With his most recent hospitalization, he developed significant shortness of breath and wheezing which were unrelieved with nebulizer treatment. During his arrival at the hospital he his blood pressure was elevated and he was extremely hypoxic. He was resumed back on his home blood pressure medications which are amlodipine and lisinopril and started on steroid treatments, Levaquin, and nebulizer treatments. He has completed his steroid treatments at present and he is no longer taking antibiotics. He reports improvement of shortness of breath and wheezing. With activity mainly as when he experiences dyspnea. He reports resolution of the cough and reports that he has not had a cigarette in over the last 3 weeks since his discharge from the hospital. He has an upcoming appointment with Dr. Sherene SiresWert, pulmonology on 10/06/2016. He requests refills on his medications today.  Unintentional weight loss Matthew Morrison Darko is concerned that he is losing weight without trying. He reports that he eats multiple times per day and includes periodic high  calorie snacks and is unable to achieve weight gain. Reports baseline weight is 120 pounds and at present he weighs 102 lb Body mass index is 17.51 kg/m.Marland Kitchen. He has been previously screened for HIV which was negative. He is unaware of any prior problems with thyroid dysfunction. To his knowledge, he has no prior screenings for hepatitis B or hepatitis C. Social History   Social History  . Marital status: Single    Spouse name: N/A  . Number of children: N/A  . Years of education: N/A   Occupational History  . Not on file.   Social History Main Topics  . Smoking status: Current Every Day Smoker    Packs/day: 0.25    Types: Cigarettes  . Smokeless tobacco: Never Used  . Alcohol use Yes  . Drug use: Unknown  . Sexual activity: Not on file   Other Topics Concern  . Not on file   Social History Narrative  . No narrative on file    Family History  Problem Relation Age of Onset  . Depression Mother   . Stroke Father    Review of Systems See history of present illness  Patient Active Problem List   Diagnosis Date Noted  . COPD with acute exacerbation (HCC) 09/19/2016  . Acute respiratory failure with hypoxia (H[facility]) 09/19/2016  . Emphysema of lung ([facility]) 09/19/2016  . Essential hypertension 09/19/2016  . Hypercholesterolemia 09/19/2016  . COPD exacerbation ([facility]) 09/19/2016    Allergies  Allergen Reactions  . Norflex [Orphenadrine] Hives    Prior to Admission medications   Medication Sig Start Date End Date Taking? Authorizing Provider  albuterol (PROVENTIL HFA;VENTOLIN HFA) 108 (90  Base) MCG/ACT inhaler Inhale 2 puffs into the lungs every 6 (six) hours as needed for wheezing or shortness of breath. 09/26/16  Yes Sheikh, Omair Latif, DO  albuterol (PROVENTIL) (2.5 MG/3ML) 0.083% nebulizer solution Take 3 mLs (2.5 mg total) by nebulization 4 (four) times daily. 09/26/16  Yes Sheikh, Omair Latif, DO  amLODipine (NORVASC) 10 MG tablet Take 1 tablet (10 mg total) by mouth daily.  09/26/16  Yes Sheikh, Omair Latif, DO  budesonide (PULMICORT) 0.5 MG/2ML nebulizer solution Take 2 mLs (0.5 mg total) by nebulization 2 (two) times daily. 09/26/16  Yes Sheikh, Omair Latif, DO  dextromethorphan-guaiFENesin (MUCINEX DM) 30-600 MG 12hr tablet Take 2 tablets by mouth 2 (two) times daily. 09/26/16  Yes Sheikh, Omair Latif, DO  feeding supplement (BOOST / RESOURCE BREEZE) LIQD Take 1 Container by mouth 3 (three) times daily between meals. 09/26/16  Yes Sheikh, Omair Latif, DO  hydrOXYzine (ATARAX/VISTARIL) 10 MG tablet Take 1 tablet (10 mg total) by mouth 3 (three) times daily as needed for anxiety. 09/26/16  Yes Sheikh, Omair Latif, DO  ipratropium (ATROVENT) 0.02 % nebulizer solution Take 2.5 mLs (0.5 mg total) by nebulization 4 (four) times daily. 09/26/16  Yes Sheikh, Omair Latif, DO  irbesartan (AVAPRO) 75 MG tablet Take 0.5 tablets (37.5 mg total) by mouth daily. 09/26/16  Yes Sheikh, Omair Latif, DO  morphine (MSIR) 15 MG tablet Take 0.5 tablets (7.5 mg total) by mouth every 4 (four) hours as needed (shortness of breath). 09/26/16  Yes Sheikh, Omair Latif, DO  nicotine (NICODERM CQ - DOSED IN MG/24 HOURS) 14 mg/24hr patch Place 1 patch (14 mg total) onto the skin daily. 09/26/16  Yes Sheikh, Omair Latif, DO  pantoprazole (PROTONIX) 40 MG tablet Take 1 tablet (40 mg total) by mouth daily. 09/26/16  Yes Sheikh, Omair Latif, DO  polyethylene glycol (MIRALAX / GLYCOLAX) packet Take 17 g by mouth daily. 09/26/16  Yes Sheikh, Omair Latif, DO  predniSONE (DELTASONE) 10 MG tablet Take 1 tablet (10 mg total) by mouth See admin instructions. 40mg  po daily for 3 days, then take 30mg  po daily for 3 days, then take 20mg  po daily for two days, then take 10mg  po daily for 2 days and then Stop. 09/26/16  Yes Sheikh, Omair Latif, DO  senna-docusate (SENOKOT-S) 8.6-50 MG tablet Take 1 tablet by mouth 2 (two) times daily. 09/26/16  Yes Merlene Laughter, DO    Past Medical, Surgical Family and Social History  reviewed and updated.    Objective:   Today's Vitals   10/03/16 0954  BP: (!) 130/98  Pulse: 96  Resp: 14  Temp: 97.9 F (36.6 C)  TempSrc: Oral  SpO2: 97%  Weight: 102 lb (46.3 kg)  Height: 5\' 4"  (1.626 m)    Wt Readings from Last 3 Encounters:  10/03/16 102 lb (46.3 kg)  09/19/16 109 lb 2 oz (49.5 kg)    Physical Exam  Constitutional: He is oriented to person, place, and time. He appears cachectic. He appears ill.  Patient appears very frail  HENT:  Head: Normocephalic and atraumatic.  Mouth/Throat: Oropharynx is clear and moist.  Eyes: Conjunctivae and EOM are normal. Pupils are equal, round, and reactive to light.  Neck: Normal range of motion. Neck supple.  Cardiovascular: Normal rate, regular rhythm, normal heart sounds and intact distal pulses.   Pulmonary/Chest: Effort normal and breath sounds normal.  Abdominal: He exhibits no distension and no mass. There is no tenderness. There is no rebound and no guarding.  Musculoskeletal: Normal range of motion. He exhibits no edema.  Neurological: He is alert and oriented to person, place, and time.  Skin: Skin is warm and dry.  Psychiatric: He has a normal mood and affect. His behavior is normal. Judgment and thought content normal.    Assessment & Plan:  1. Pulmonary emphysema, unspecified emphysema type (HCC), Stable today, no hypoxia or respiratory distress. -CBC with Differential - COMPLETE METABOLIC PANEL WITH GFR -Continue medications as prescribed. -Keep follow-up with pulmonology.  2. Unintentional weight loss, likely related to his current disease process. However, I will screen for infectious and metabolic sources for weight loss. - Thyroid Panel With TSH - Hepatitis C antibody, reflex - Hepatitis B surface antibody - Hepatitis B surface antigen -Continue boost supplements, 1 container 3 times per day.  RTC: 4 month for routine follow-up. If you need to return sooner, please schedule an  appointment.  Godfrey Pick. Tiburcio Pea, MSN, FNP-C [facility]  [address] [place], [place] [address] [phone]

## 2016-10-04 ENCOUNTER — Telehealth: Payer: Self-pay

## 2016-10-04 LAB — HEPATITIS B SURFACE ANTIGEN: Hepatitis B Surface Ag: NEGATIVE

## 2016-10-04 LAB — HEPATITIS C ANTIBODY: HCV AB: NEGATIVE

## 2016-10-04 LAB — HEPATITIS B SURFACE ANTIBODY,QUALITATIVE: Hep B S Ab: NEGATIVE

## 2016-10-04 MED ORDER — ALBUTEROL SULFATE HFA 108 (90 BASE) MCG/ACT IN AERS: 2.0000 | INHALATION_SPRAY | Freq: Four times a day (QID) | RESPIRATORY_TRACT | 2 refills | Status: DC | PRN

## 2016-10-04 NOTE — Telephone Encounter (Signed)
Inhaler sent into pharmacy

## 2016-10-06 ENCOUNTER — Encounter: Payer: Self-pay | Admitting: Internal Medicine

## 2016-10-06 ENCOUNTER — Ambulatory Visit (INDEPENDENT_AMBULATORY_CARE_PROVIDER_SITE_OTHER): Payer: Medicaid Other | Admitting: Internal Medicine

## 2016-10-06 VITALS — BP 106/70 | HR 111 | Ht 64.0 in | Wt 104.0 lb

## 2016-10-06 DIAGNOSIS — J449 Chronic obstructive pulmonary disease, unspecified: Secondary | ICD-10-CM | POA: Diagnosis not present

## 2016-10-06 DIAGNOSIS — J9601 Acute respiratory failure with hypoxia: Secondary | ICD-10-CM | POA: Diagnosis not present

## 2016-10-06 DIAGNOSIS — I10 Essential (primary) hypertension: Secondary | ICD-10-CM | POA: Diagnosis not present

## 2016-10-06 MED ORDER — TIOTROPIUM BROMIDE-OLODATEROL 2.5-2.5 MCG/ACT IN AERS: 2.0000 | INHALATION_SPRAY | Freq: Every day | RESPIRATORY_TRACT | 11 refills | Status: DC

## 2016-10-06 MED ORDER — TIOTROPIUM BROMIDE-OLODATEROL 2.5-2.5 MCG/ACT IN AERS: 2.0000 | INHALATION_SPRAY | Freq: Every day | RESPIRATORY_TRACT | 0 refills | Status: DC

## 2016-10-06 NOTE — Patient Instructions (Addendum)
Plan A = Automatic = stiolto 2 pffs each am  Work on inhaler technique:  relax and gently blow all the way out then take a nice smooth deep breath back in, triggering the inhaler at same time you start breathing in.  Hold for up to 5 seconds if you can. Blow out thru nose. Rinse and gargle with water when done      Plan B = Backup Only use your albuterol(ventolin) as a rescue medication to be used if you can't catch your breath by resting or doing a relaxed purse lip breathing pattern.  - The less you use it, the better it will work when you need it. - Ok to use the inhaler up to 2 puffs  every 4 hours if you must but call for appointment if use goes up over your usual need - Don't leave home without it !!  (think of it like the spare tire for your car)   Plan C = Crisis - only use your albuterol/ipatropium  nebulizer if you first try Plan B and it fails to help > ok to use the nebulizer up to every 4 hours but if start needing it regularly call for immediate appointment    Please schedule a follow up office visit in 6 weeks, call sooner if needed with all medications /inhalers/ solutions in hand so we can verify exactly what you are taking. This includes all medications from all doctors and over the counters  Add discuss rehab next ov

## 2016-10-06 NOTE — Assessment & Plan Note (Addendum)
Quit smoking 09/2016 - Spirometry 10/06/2016  FEV1 0.62 (25%)  Ratio 35   - 10/06/2016  After extensive coaching device effectiveness =    75% (short Ti) >> try stiolto 2 each am x 4 week samples  Despite very severe obstruction not clear there is an asthmatic component as appears mostly emphysematous ie  Pt is Group B in terms of symptom/risk and laba/lama therefore appropriate rx at this point.      If starts to flare will change to symb/spiriva next option and in meatime key to leave off acei and advair and cigs/ discussed   I had an extended discussion with the patient reviewing all relevant studies completed to date and  lasting 25 minutes of a 40  minute transition of care office  visit addressing   severe non-specific but potentially very serious refractory respiratory symptoms of uncertain and potentially multiple  etiologies.   Each maintenance medication was reviewed in detail including most importantly the difference between maintenance and prns and under what circumstances the prns are to be triggered using an action plan format that is not reflected in the computer generated alphabetically organized AVS.    Please see AVS for specific instructions unique to this office visit that I personally wrote and verbalized to the the pt in detail and then reviewed with pt  by my nurse highlighting any changes in therapy/plan of care  recommended at today's visit.

## 2016-10-06 NOTE — Assessment & Plan Note (Signed)
10/06/2016  Walked RA x 3 laps @ 185 ft each stopped due to  End of study, slow pace and tired at end > sob but no desat  As of 10/06/2016 02 is prn

## 2016-10-06 NOTE — Progress Notes (Signed)
Subjective:     Patient ID: Matthew Morrison, male   DOB: 03-13-1956,     MRN: [id number]  HPI  [age] yobm quit smoking 09/2016 s/p incarceration until March 2018 with basline =  MMRC3 = can't walk 100 yards even at a slow pace at a flat grade s stopping due to sob (since around 2016)  Then ran out of duoneb/saba and 1-2 weeks later much worse> admit   Admit date: 09/19/2016 Discharge date: 09/26/2016    Brief/Interim Summary: Annabelle HarmanDana Fordis a 61 y.o.malewith a past medical history of Emphysema, Hypertension, Arthritis, HLD and other comorbids who [place]. He has been out of his medications for a few months. He's had 2 visits to the emergency department for shortness of breath over the last 1 month. He was in his usual state of health about a couple days ago when he started developing shortness of breath with wheezing. Has had a cough. It has been dry. Denies any fever. No nausea, vomiting. Has had some chest tightness, but mainly with coughing. Symptoms are not getting better despite treatment at home with nebulizer treatments and so he decided to come into the hospital. In the emergency department, patient was given a breathing treatments and admitted for Hypoxia that was refractory to the Steroid Neb Treatments given in the ED. He was slowly improving and still feels SOB so a CT Chest Angio was ordered to r/o PE as patient was still tachycardic. CT showed noPE. Patient was slowly improving so Pulmonary was consulted for further management and evaluation for additional recommendations and they adjusted some of his medications. Palliative Care consulted for Symptom management at the recommendation of Pulmonary for his Severe/End Stage Lung Disease. Palliative Care placed the patient on po Morphine for Dyspnea and it helped so Morphine was increased to 7.5 mg po q4hprn. Patient improved and Pulmonary gave the ok to D/C home and will have patient follow up with PCP and Pulmonary as  an outpatient.    Discharge Diagnoses:  Principal Problem:   COPD with acute exacerbation (HCC) Active Problems:   Acute respiratory failure with hypoxia (HCC)   Emphysema of lung (HCC)   Essential hypertension   Hypercholesterolemia   COPD exacerbation (HCC)  Acute Respiratory Failure with Hypoxia likely 2/2 to Severe COPD -Patient desaturated to 88% on Room Air while Ambulating  -C/w Supplemental O2 and Wean As tolerated -C/w Continuous Pulse Oximetry and Maintain O2 Saturations >92% -Repeat CXR as an outpatient  -Obtained CT Chest Angio to rule out Pulmonary Embolus and did not show any PE -Pulmonary Consulted and Appreciate Dr. Sood's/Kearston Putman's recommendations -Walk Screen qualified patient for O2; Will send on Home O2  COPD with Acute exacerbation [facility](HCC): -Patient continues to have shortness of breath when talking, moving and changing positions. He is on 2L of oxygen and saturation is in the mid 90's on rest and needs increased O2 and 3-4 Liters ambulating.  -Continue Supplemental O2, C/w Albuterol 2.5 mg Neb qid and Ipratropium 0.5 mg Neb qidand Albuterol 2.5 mg Neb q2hprn for wheezing, Levofloxacin 500 mg po Daily (Day 7/7) Discontinued Yesterday  -Changed Methylprednisolone 80 mg IV q12h to 40 mg IV q12h: Start Steroid Taper per Pulm Reccs: Take 40mg  po daily for 3 days, then take 30mg  po daily for 3 days, then take 20mg  po daily for two days, then take 10mg  po daily for 2 days -C/w Protonix 40 mg po Daily  -C/w Budesonide 0.25 mg Neb RT BID  at D/C; Arformoterol Neb 15 mcg D/C'd by Pulmonary -C/w Dextromethorphan-Guaifenesin 30-600 2 Tablet po BID by Pulmonary  -Added Flutter Valve -NaCl Hypertonic Nebs 4 mL Daily discontinued  -C/w Incentive Spirometry and added Hydroxazine 10 mg po TIDprn for anxiety associated with breathing  -Pulmonary Added Umeclidinium Bromide 1 puff IH Daily yesterday but D/C'd 09/24/16 by Dr. Sherene Sires because ACEi causing intolerance to DPI inhibitor's   -Checked Respiratory Virus Panel and was Negative and patient is no longer on Droplet Precautions -Chest CT Angio for PE was Negative for PE -Consulted Pulmonary for further Recc's and appreciated Recc's -Pulmonary stating patient may benefit from Palliative Care Assessment; Palliative Care Medicine consulted for symptom management -Palliative Care placed the patient on po Morphine 5 mg po q4hprn and increased it to 7.5 mg po q4hprn for WOB and Dyspnea  -C/w po Morphine at D/C -C/w PCP and with Pulmonary at D/C; Started Albuterol Inhaler at D/C  Emphysema of lung (HCC) -CXR indicated some changes in the right upper lobe and a CT was ordered.  -CT Results showed scarring of the right upper lobe and emphysema of the lungs. No nodules or pneumonia suspected.  -As above;  -Repeated the Chest CT and doing it with Contrast to r/o PE and showed No pulmonary Embolus or Acute Aortic Syndrome but did show Aortic Atherosclerosis and severe Emphysema -As Above -Follow up with Pulmonary Dr. Sherene Sires 10/06/16 at 1:30 pm   Urinary Urgency -For 1 week patient has been experiencing the need to urinate but voiding only a small amount. Denies other urinary symptoms.  -PSA was normal and Urinalysis Negative.  -Improved; Follow up with PCP as an outpatient   Changes in bowel habits concern for Constipation: -Patient has been experiencing the need to void but states that for about a week, "only water comes out" and when he looks in the toilet, the discharge appears similar to mucous. States he had 2 normal bowel movements in the last week. Suspect there may be some constipation.  -C/w Miralax 17 grams po Daily and Senna-Docusate 1 tab po BID now that patient will be on Morphine for Dyspnea  -Continue to Monitor as he had a normal bowel movement yesterday  Essential Hypertension: -Patient had run out of home BP meds as well, admission BP was 179/128, trending down and currently is 129/81 -C/w Amlodipine 10  mg po Daily -Pulmonary changed Patient's Home Lisinopril 10 mg po Daily to Irbesartan 37.5 mg po Daily permantly because of Cough and will continue Irbesartan at D/C -Per Pulmonary the only way to prove the symptoms are not from ACEi is to be off of ACEi for 6 weeks  -Establish with and follow up with PCP  Moderate Malnutrition in the Context of Chronic illness -Nutritionist Consulted and Recc's Appreciated -C/w Boost Breeze 1 Container po TID  Tobacco Abuse/Smoker -Smoking Cessation Counseling given -Nicotine 14 mg TD q24h script given        10/06/2016 1st [facility] office visit/ Monteen Toops   Off acei since 09/24/16  Chief Complaint  Patient presents with  . Hospitalization Follow-up    Breathing has improved some, but not back at his normal baseline. He uses albuterol inhaler 3 x per wk on average and neb with albuterol and ipratropium 4 x daily scheduled. He has o2 that he uses with sleep and "a couple of times during the day".     doe= MMRC3 = can't walk 100 yards even at a slow pace at a flat grade s stopping  due to sob  Even on 02   No obvious day to day or daytime variability or assoc excess/ purulent sputum or mucus plugs or hemoptysis or cp or chest tightness, subjective wheeze or overt sinus or hb symptoms. No unusual exp hx or h/o childhood pna/ asthma or knowledge of premature birth.  Sleeping ok without nocturnal  or early am exacerbation  of respiratory  c/o's or need for noct saba. Also denies any obvious fluctuation of symptoms with weather or environmental changes or other aggravating or alleviating factors except as outlined above   Current Medications, Allergies, Complete Past Medical History, Past Surgical History, Family History, and Social History were reviewed in [employer] record.  ROS  The following are not active complaints unless bolded sore throat, dysphagia, dental problems, itching, sneezing,  nasal congestion or excess/  purulent secretions, ear ache,   fever, chills, sweats, unintended wt loss, classically pleuritic or exertional cp,  orthopnea pnd or leg swelling, presyncope, palpitations, abdominal pain, anorexia, nausea, vomiting, diarrhea  or change in bowel or bladder habits, change in stools or urine, dysuria,hematuria,  rash, arthralgias, visual complaints, headache, numbness, weakness or ataxia or problems with walking or coordination,  change in mood/affect or memory.          Review of Systems     Objective:   Physical Exam    thin amb bm nad  Wt Readings from Last 3 Encounters:  10/06/16 104 lb (47.2 kg)  10/03/16 102 lb (46.3 kg)  09/19/16 109 lb 2 oz (49.5 kg)    Vital signs reviewed  - Note on arrival 02 sats  96% on RA     HEENT: nl dentition, turbinates bilaterally, and oropharynx. Nl external ear canals without cough reflex   NECK :  without JVD/Nodes/TM/ nl carotid upstrokes bilaterally   LUNGS: no acc muscle use,  Mod barrel chested, distant pan exp wheeze bilaterally - better with plm    CV:  RRR  no s3 or murmur or increase in P2, and no edema   ABD:  soft and nontender with nl inspiratory excursion in the supine position. No bruits or organomegaly appreciated, bowel sounds nl  MS:  Nl gait/ ext warm without deformities, calf tenderness, cyanosis or clubbing No obvious joint restrictions   SKIN: warm and dry without lesions    NEURO:  alert, approp, nl sensorium with  no motor or cerebellar deficits apparent.      . I personally reviewed images and agree with radiology impression as follows:  CXR:  09/26/16 Chronic emphysema and pulmonary scarring.  No active disease.  Assessment:

## 2016-10-06 NOTE — Assessment & Plan Note (Signed)
Although even in retrospect it may not be clear the ACEi contributed to the pt's symptoms,  Pt improved off them and adding them back at this point or in the future would risk confusion in interpretation of non-specific respiratory symptoms to which this patient is prone  ie  Better not to muddy the waters here.   rec avapro 150 mg daily >  Follow up per Primary Care planned

## 2016-10-10 ENCOUNTER — Other Ambulatory Visit: Payer: Self-pay | Admitting: Internal Medicine

## 2016-10-11 ENCOUNTER — Telehealth: Payer: Self-pay | Admitting: Internal Medicine

## 2016-10-11 NOTE — Telephone Encounter (Signed)
Called to initiate the PA for the stiolto at [phone]. Was advised that the stiolto and the spiriva are preferred meds.  Matthew Morrison stated that the order needed to be changed to say 30 day supply since the container cannot be broken up.  This went through when the pharmacy ran it this way and they will order it and have it in tomorrow.

## 2016-10-26 MED FILL — STIOLTO RESPIMAT INHAL SPRY: 2.5-2.5 | 30 days supply | Qty: 4 | Fill #0

## 2016-10-27 MED FILL — PROVENTIL HFA 108 (90 BASE): 108 (90 BAS | 25 days supply | Qty: 7 | Fill #0

## 2016-11-01 ENCOUNTER — Emergency Department (HOSPITAL_COMMUNITY): Payer: Medicaid Other

## 2016-11-01 ENCOUNTER — Encounter (HOSPITAL_COMMUNITY): Payer: Self-pay | Admitting: Emergency Medicine

## 2016-11-01 ENCOUNTER — Inpatient Hospital Stay (HOSPITAL_COMMUNITY)
Admission: EM | Admit: 2016-11-01 | Discharge: 2016-11-03 | DRG: 190 | Disposition: A | Discharge status: Home / Self Care | Payer: Medicaid Other | Attending: Internal Medicine | Admitting: Internal Medicine

## 2016-11-01 ENCOUNTER — Ambulatory Visit: Payer: Medicaid Other | Admitting: Family Medicine

## 2016-11-01 DIAGNOSIS — Z9981 Dependence on supplemental oxygen: Secondary | ICD-10-CM

## 2016-11-01 DIAGNOSIS — R0603 Acute respiratory distress: Secondary | ICD-10-CM | POA: Diagnosis present

## 2016-11-01 DIAGNOSIS — Z681 Body mass index (BMI) 19 or less, adult: Secondary | ICD-10-CM

## 2016-11-01 DIAGNOSIS — J441 Chronic obstructive pulmonary disease with (acute) exacerbation: Secondary | ICD-10-CM | POA: Diagnosis present

## 2016-11-01 DIAGNOSIS — R21 Rash and other nonspecific skin eruption: Secondary | ICD-10-CM | POA: Diagnosis present

## 2016-11-01 DIAGNOSIS — J9601 Acute respiratory failure with hypoxia: Secondary | ICD-10-CM

## 2016-11-01 DIAGNOSIS — J439 Emphysema, unspecified: Principal | ICD-10-CM | POA: Diagnosis present

## 2016-11-01 DIAGNOSIS — Z87891 Personal history of nicotine dependence: Secondary | ICD-10-CM

## 2016-11-01 DIAGNOSIS — E43 Unspecified severe protein-calorie malnutrition: Secondary | ICD-10-CM | POA: Diagnosis present

## 2016-11-01 DIAGNOSIS — E7800 Pure hypercholesterolemia, unspecified: Secondary | ICD-10-CM | POA: Diagnosis present

## 2016-11-01 DIAGNOSIS — I10 Essential (primary) hypertension: Secondary | ICD-10-CM | POA: Diagnosis present

## 2016-11-01 DIAGNOSIS — Z888 Allergy status to other drugs, medicaments and biological substances status: Secondary | ICD-10-CM

## 2016-11-01 LAB — PHOSPHORUS: Phosphorus: 4 mg/dL (ref 2.5–4.6)

## 2016-11-01 LAB — CBC
HCT: 40.2 % (ref 39.0–52.0)
HEMOGLOBIN: 13.5 g/dL (ref 13.0–17.0)
MCH: 23.5 pg — ABNORMAL LOW (ref 26.0–34.0)
MCHC: 33.6 g/dL (ref 30.0–36.0)
MCV: 70 fL — ABNORMAL LOW (ref 78.0–100.0)
Platelets: 329 10*3/uL (ref 150–400)
RBC: 5.74 MIL/uL (ref 4.22–5.81)
RDW: 15 % (ref 11.5–15.5)
WBC: 8.5 10*3/uL (ref 4.0–10.5)

## 2016-11-01 LAB — BASIC METABOLIC PANEL
ANION GAP: 12 (ref 5–15)
BUN: 7 mg/dL (ref 6–20)
CALCIUM: 9.6 mg/dL (ref 8.9–10.3)
CO2: 28 mmol/L (ref 22–32)
Chloride: 95 mmol/L — ABNORMAL LOW (ref 101–111)
Creatinine, Ser: 0.65 mg/dL (ref 0.61–1.24)
GFR calc Af Amer: >60 mL/min (ref >60)
GLUCOSE: 99 mg/dL (ref 65–99)
Potassium: 3.5 mmol/L (ref 3.5–5.1)
SODIUM: 135 mmol/L (ref 135–145)

## 2016-11-01 LAB — MAGNESIUM: MAGNESIUM: 2 mg/dL (ref 1.7–2.4)

## 2016-11-01 LAB — TROPONIN I

## 2016-11-01 MED ORDER — DEXTROSE 5 % IV SOLN
500.0000 mg | Freq: Once | INTRAVENOUS | Status: AC
  Administered 2016-11-01: 500 mg via INTRAVENOUS
  Filled 2016-11-01: qty 500

## 2016-11-01 MED ORDER — MAGNESIUM SULFATE 2 GM/50ML IV SOLN
2.0000 g | Freq: Once | INTRAVENOUS | Status: AC
  Administered 2016-11-01: 2 g via INTRAVENOUS
  Filled 2016-11-01: qty 50

## 2016-11-01 MED ORDER — METHYLPREDNISOLONE SODIUM SUCC 125 MG IJ SOLR: 125.0000 mg | Freq: Once | INTRAMUSCULAR | Status: DC

## 2016-11-01 MED ORDER — ALBUTEROL (5 MG/ML) CONTINUOUS INHALATION SOLN
10.0000 mg/h | INHALATION_SOLUTION | RESPIRATORY_TRACT | Status: DC
  Administered 2016-11-01: 10 mg/h via RESPIRATORY_TRACT
  Filled 2016-11-01: qty 20

## 2016-11-01 MED ORDER — POTASSIUM CHLORIDE CRYS ER 20 MEQ PO TBCR
20.0000 meq | EXTENDED_RELEASE_TABLET | Freq: Once | ORAL | Status: AC
  Administered 2016-11-01: 20 meq via ORAL
  Filled 2016-11-01: qty 1

## 2016-11-01 NOTE — ED Provider Notes (Signed)
[facility]-EMERGENCY DEPT Provider Note   CSN: [id number] Arrival date & time: 11/01/16  1627     History   Chief Complaint Chief Complaint  Patient presents with  . Respiratory Distress    HPI Matthew Morrison is a 61 y.o. male.  HPI  61 year old male with a history of emphysema and asthma presents immersed from today with respiratory distress. Patient states that last couple days of progressively worsening dyspnea. Tried multiple breathing treatments at home and tried using his oxygen but did not help. Called EMS on EMS arrival he is on his home oxygen but cannot completely sentences due to his dyspnea 95%. They gave him Solu-Medrol and albuterol and brought him here. A chest pain but has had a nonproductive cough. No fevers. No pain elsewhere. No swollen legs or history of blood clots.  Past Medical History:  Diagnosis Date  . Asthma   . Emphysema lung (HCC)   . Hypertension     Patient Active Problem List   Diagnosis Date Noted  . COPD exacerbation (HCC) 11/01/2016  . Acute respiratory failure with hypoxia (HCC) 09/19/2016  . COPD GOLD IV 09/19/2016  . Essential hypertension 09/19/2016  . Hypercholesterolemia 09/19/2016    Past Surgical History:  Procedure Laterality Date  . HERNIA REPAIR         Home Medications    Prior to Admission medications   Medication Sig Start Date End Date Taking? Authorizing Provider  albuterol (PROVENTIL HFA;VENTOLIN HFA) 108 (90 Base) MCG/ACT inhaler Inhale 2 puffs into the lungs every 6 (six) hours as needed for wheezing or shortness of breath. 10/04/16  Yes Bing NeighborsHarris, Kimberly S, FNP  albuterol (PROVENTIL) (2.5 MG/3ML) 0.083% nebulizer solution Take 3 mLs (2.5 mg total) by nebulization 4 (four) times daily. 10/03/16 01/31/17 Yes Bing NeighborsHarris, Kimberly S, FNP  amLODipine (NORVASC) 10 MG tablet Take 1 tablet (10 mg total) by mouth daily. 10/03/16  Yes Bing NeighborsHarris, Kimberly S, FNP  hydrOXYzine (ATARAX/VISTARIL) 10 MG tablet Take 1 tablet (10 mg total) by mouth 3  (three) times daily as needed for anxiety. 10/03/16  Yes Bing NeighborsHarris, Kimberly S, FNP  ipratropium (ATROVENT) 0.02 % nebulizer solution Take 2.5 mLs (0.5 mg total) by nebulization 4 (four) times daily. 10/03/16 01/31/17 Yes Bing NeighborsHarris, Kimberly S, FNP  irbesartan (AVAPRO) 75 MG tablet Take 0.5 tablets (37.5 mg total) by mouth daily. 10/03/16  Yes Bing NeighborsHarris, Kimberly S, FNP  Tiotropium Bromide-Olodaterol (STIOLTO RESPIMAT) 2.5-2.5 MCG/ACT AERS Inhale 2 puffs into the lungs daily. 10/06/16  Yes Nyoka CowdenWert, Michael B, MD  dextromethorphan-guaiFENesin [facility](MUCINEX DM) 30-600 MG 12hr tablet Take 2 tablets by mouth 2 (two) times daily. Patient not taking: Reported on 11/01/2016 09/26/16   Marguerita MerlesSheikh, Omair Latif, DO  feeding supplement (BOOST / RESOURCE BREEZE) LIQD Take 1 Container by mouth 3 (three) times daily between meals. Patient not taking: Reported on 10/06/2016 09/26/16   Marguerita MerlesSheikh, Omair Latif, DO  morphine (MSIR) 15 MG tablet Take 0.5 tablets (7.5 mg total) by mouth every 4 (four) hours as needed (shortness of breath). Patient not taking: Reported on 11/01/2016 09/26/16   Marguerita MerlesSheikh, Omair Latif, DO  nicotine (NICODERM CQ - DOSED IN MG/24 HOURS) 14 mg/24hr patch Place 1 patch (14 mg total) onto the skin daily. Patient not taking: Reported on 11/01/2016 09/26/16   Marguerita MerlesSheikh, Omair Latif, DO  OXYGEN 2 lpm with sleep and occ during the day    [provider]  pantoprazole (PROTONIX) 40 MG tablet Take 1 tablet (40 mg total) by mouth daily. Patient not taking: Reported on  11/01/2016 10/03/16   Bing NeighborsHarris, Kimberly S, FNP  polyethylene glycol (MIRALAX / Ethelene HalGLYCOLAX) packet Take 17 g by mouth daily. Patient not taking: Reported on 11/01/2016 09/26/16   Marguerita MerlesSheikh, Omair Latif, DO  senna-docusate (SENOKOT-S) 8.6-50 MG tablet Take 1 tablet by mouth 2 (two) times daily. Patient not taking: Reported on 11/01/2016 09/26/16   Merlene LaughterSheikh, Omair Latif, DO    Family History Family History  Problem Relation Age of Onset  . Depression Mother   . Stroke Father      Social History Social History  Substance Use Topics  . Smoking status: Former Smoker    Packs/day: 1.00    Years: 40.00    Types: Cigarettes    Quit date: 09/06/2016  . Smokeless tobacco: Never Used  . Alcohol use Yes     Allergies   Norflex [orphenadrine]   Review of Systems Review of Systems  All other systems reviewed and are negative.    Physical Exam Updated Vital Signs BP 99/77   Pulse 90   Temp 98 F (36.7 C) (Oral)   Resp 14   Ht 5\' 4"  (1.626 m)   Wt 47.2 kg (104 lb)   SpO2 97%   BMI 17.85 kg/m   Physical Exam  Constitutional: He is oriented to person, place, and time. He appears well-developed and well-nourished.  HENT:  Head: Normocephalic and atraumatic.  Eyes: Conjunctivae and EOM are normal.  Neck: Normal range of motion.  Cardiovascular: Normal rate.   Pulmonary/Chest: He is in respiratory distress. He has wheezes.  Abdominal: Soft. He exhibits no distension.  Musculoskeletal: Normal range of motion.  Neurological: He is alert and oriented to person, place, and time.  Skin: Skin is warm and dry.  Nursing note and vitals reviewed.    ED Treatments / Results  Labs (all labs ordered are listed, but only abnormal results are displayed) Labs Reviewed  BASIC METABOLIC PANEL - Abnormal; Notable for the following:       Result Value   Chloride 95 (*)    All other components within normal limits  CBC - Abnormal; Notable for the following:    MCV 70.0 (*)    MCH 23.5 (*)    All other components within normal limits  TROPONIN I  MAGNESIUM  PHOSPHORUS    EKG  EKG Interpretation  Date/Time:  Tuesday November 01 2016 16:40:24 EDT Ventricular Rate:  108 PR Interval:    QRS Duration: 77 QT Interval:  343 QTC Calculation: 460 R Axis:   76 Text Interpretation:  Age not entered, assumed to be  61 years old for purpose of ECG interpretation Sinus tachycardia Biatrial enlargement Anteroseptal infarct, age indeterminate improved T waves compared  to previous Confirmed by Marily MemosMesner, Shon Mansouri [phone]) on 11/01/2016 5:11:44 PM       Radiology Dg Chest 2 View  Result Date: 11/01/2016 CLINICAL DATA:  Shortness of breath EXAM: CHEST  2 VIEW COMPARISON:  09/26/2016 FINDINGS: Right upper lobe scarring. Right basilar scarring/ atelectasis. Hyperinflation. No focal consolidation. No pleural effusion or pneumothorax. The heart is normal in size. Visualized osseous structures are within normal limits. IMPRESSION: No evidence of acute cardiopulmonary disease. Electronically Signed   By: Charline BillsSriyesh  Krishnan M.D.   On: 11/01/2016 17:39    Procedures Procedures (including critical care time)  Medications Ordered in ED Medications  albuterol (PROVENTIL,VENTOLIN) solution continuous neb (10 mg/hr Nebulization New Bag/Given 11/01/16 1723)  methylPREDNISolone sodium succinate (SOLU-MEDROL) 125 mg/2 mL injection 125 mg (125 mg Intravenous Not Given 11/01/16 2138)  azithromycin (ZITHROMAX) 500 mg in dextrose 5 % 250 mL IVPB (0 mg Intravenous Stopped 11/01/16 2205)  magnesium sulfate IVPB 2 g 50 mL (0 g Intravenous Stopped 11/01/16 2352)  potassium chloride SA (K-DUR,KLOR-CON) CR tablet 20 mEq (20 mEq Oral Given 11/01/16 2205)     Initial Impression / Assessment and Plan / ED Course  I have reviewed the triage vital signs and the nursing notes.  Pertinent labs & imaging results that were available during my care of the patient were reviewed by me and considered in my medical decision making (see chart for details).  Clinical Course as of Nov 02 44  Tue Nov 01, 2016  1918 Reeval and sats on room air 93-94%. No distress. Lungs improved aeration, still somewhat tight and wheezing. Will ask for walking pulse ox in about 20 minutes to determine disposition.   [JM]  2048 Reeval. Still tight and wheezing. Walked without hypoxia on 2L but still feeling dyspneic and not speaking in full sentences. Will plan for observation. More breathing treatments. Add on abx.  [JM]     Clinical Course User Index [JM] Janaisa Birkland, Barbara Cower, MD      Final Clinical Impressions(s) / ED Diagnoses   Final diagnoses:  COPD exacerbation (HCC)  Acute respiratory failure with hypoxia (HCC)     Darrielle Pflieger, Barbara Cower, MD 11/02/16 [phone]

## 2016-11-01 NOTE — H&P (Signed)
History and Physical    Matthew SpeedDana Morrison ZOX:[id number] DOB: 01/27/1956 DOA: 11/01/2016  PCP: Bing NeighborsHarris, Kimberly S, FNP   Patient coming from: Home.  I have personally briefly reviewed patient's old medical records in [facility]  Chief Complaint: Shortness of breath.  HPI: Matthew SpeedDana Claud is a 61 y.o. male with medical history significant of asthma,/emphysema, hypertension and was coming to the emergency department with complaints of progressively worse dyspnea since Sunday when he was exposed to a foul smell in his apartment complexes launder room. He denies fever, chills, but complains of progressively worse dyspnea, wheezing and pleuritic chest pain. Today, his symptoms were so intense, that he has his brother to call EMS. He denies headache, sore throat, productive cough, abdominal pain, nausea, emesis, diarrhea, constipation, melena or hematochezia. He denies dysuria, frequency or hematuria. EMS gave him DuoNeb 3 and Solu-Medrol 125 mg IVP there ED triage notes.  ED Course: Initial vital signs temperature 36.7, pulse 106, respirations 21. Blood pressure 103/79. He was saturating at 100% on nasal cannula oxygen. He was unable to speak in full sentences. His CBC, troponin level and BMP were normal. The patient was given a continuous 10 mg albuterol neb treatment and IV azithromycin. His chest radiograph did not show any acute pathology.  Review of Systems: As per HPI otherwise 10 point review of systems negative.    Past Medical History:  Diagnosis Date  . Asthma   . Emphysema lung (HCC)   . Hypertension     Past Surgical History:  Procedure Laterality Date  . HERNIA REPAIR       reports that he quit smoking about 8 weeks ago. His smoking use included Cigarettes. He has a 40.00 pack-year smoking history. He has never used smokeless tobacco. He reports that he drinks alcohol. His drug history is not on file.  Allergies  Allergen Reactions  . Norflex [Orphenadrine] Hives    Family  History  Problem Relation Age of Onset  . Depression Mother   . Stroke Father      Prior to Admission medications   Medication Sig Start Date End Date Taking? Authorizing Provider  albuterol (PROVENTIL HFA;VENTOLIN HFA) 108 (90 Base) MCG/ACT inhaler Inhale 2 puffs into the lungs every 6 (six) hours as needed for wheezing or shortness of breath. 10/04/16  Yes Bing NeighborsHarris, Kimberly S, FNP  albuterol (PROVENTIL) (2.5 MG/3ML) 0.083% nebulizer solution Take 3 mLs (2.5 mg total) by nebulization 4 (four) times daily. 10/03/16 01/31/17 Yes Bing NeighborsHarris, Kimberly S, FNP  amLODipine (NORVASC) 10 MG tablet Take 1 tablet (10 mg total) by mouth daily. 10/03/16  Yes Bing NeighborsHarris, Kimberly S, FNP  hydrOXYzine (ATARAX/VISTARIL) 10 MG tablet Take 1 tablet (10 mg total) by mouth 3 (three) times daily as needed for anxiety. 10/03/16  Yes Bing NeighborsHarris, Kimberly S, FNP  ipratropium (ATROVENT) 0.02 % nebulizer solution Take 2.5 mLs (0.5 mg total) by nebulization 4 (four) times daily. 10/03/16 01/31/17 Yes Bing NeighborsHarris, Kimberly S, FNP  irbesartan (AVAPRO) 75 MG tablet Take 0.5 tablets (37.5 mg total) by mouth daily. 10/03/16  Yes Bing NeighborsHarris, Kimberly S, FNP  Tiotropium Bromide-Olodaterol (STIOLTO RESPIMAT) 2.5-2.5 MCG/ACT AERS Inhale 2 puffs into the lungs daily. 10/06/16  Yes Nyoka CowdenWert, Michael B, MD  dextromethorphan-guaiFENesin [facility](MUCINEX DM) 30-600 MG 12hr tablet Take 2 tablets by mouth 2 (two) times daily. Patient not taking: Reported on 11/01/2016 09/26/16   Marguerita MerlesSheikh, Omair Latif, DO  feeding supplement (BOOST / RESOURCE BREEZE) LIQD Take 1 Container by mouth 3 (three) times daily between meals. Patient not  taking: Reported on 10/06/2016 09/26/16   Marguerita MerlesSheikh, Omair Latif, DO  morphine (MSIR) 15 MG tablet Take 0.5 tablets (7.5 mg total) by mouth every 4 (four) hours as needed (shortness of breath). Patient not taking: Reported on 11/01/2016 09/26/16   Marguerita MerlesSheikh, Omair Latif, DO  nicotine (NICODERM CQ - DOSED IN MG/24 HOURS) 14 mg/24hr patch Place 1 patch (14 mg total) onto the  skin daily. Patient not taking: Reported on 11/01/2016 09/26/16   Marguerita MerlesSheikh, Omair Latif, DO  OXYGEN 2 lpm with sleep and occ during the day    [provider]  pantoprazole (PROTONIX) 40 MG tablet Take 1 tablet (40 mg total) by mouth daily. Patient not taking: Reported on 11/01/2016 10/03/16   Bing NeighborsHarris, Kimberly S, FNP  polyethylene glycol [facility](MIRALAX / Ethelene HalGLYCOLAX) packet Take 17 g by mouth daily. Patient not taking: Reported on 11/01/2016 09/26/16   Marguerita MerlesSheikh, Omair Latif, DO  senna-docusate (SENOKOT-S) 8.6-50 MG tablet Take 1 tablet by mouth 2 (two) times daily. Patient not taking: Reported on 11/01/2016 09/26/16   Merlene LaughterSheikh, Omair Latif, DO    Physical Exam: Vitals:   11/01/16 1945 11/01/16 1948 11/01/16 2000 11/01/16 2045  BP:   103/81 98/77  Pulse: 98  94 92  Resp: 17  16 18   Temp:      TempSrc:      SpO2: 98% (S) 100% 98% 99%  Weight:      Height:        Constitutional: NAD, calm, comfortable Eyes: PERRL, lids and conjunctivae normal ENMT: Mucous membranes are moist. Posterior pharynx clear of any exudate or lesions.  Neck: normal, supple, no masses, no thyromegaly Respiratory: Bilateral rhonchi and wheezing, no crackles. Normal respiratory effort. No accessory muscle use.  Cardiovascular: Tachycardic at 102 BPM, no murmurs / rubs / gallops. No extremity edema. 2+ pedal pulses. No carotid bruits.  Abdomen: Soft, no tenderness, no masses palpated. No hepatosplenomegaly. Bowel sounds positive.  Musculoskeletal: no clubbing / cyanosis. Good ROM, no contractures. Normal muscle tone.  Skin: no rashes, lesions, ulcers on limited skin exam. Neurologic: CN 2-12 grossly intact. Sensation intact, DTR normal. Strength 5/5 in all 4.  Psychiatric: Normal judgment and insight. Alert and oriented x 4. Normal mood.    Labs on Admission: I have personally reviewed following labs and imaging studies  CBC:  Recent Labs Lab 11/01/16 1702  WBC 8.5  HGB 13.5  HCT 40.2  MCV 70.0*  PLT 329   Basic  Metabolic Panel:  Recent Labs Lab 11/01/16 1702  NA 135  K 3.5  CL 95*  CO2 28  GLUCOSE 99  BUN 7  CREATININE 0.65  CALCIUM 9.6   GFR: Estimated Creatinine Clearance: 67.2 mL/min (by C-G formula based on SCr of 0.65 mg/dL). Liver Function Tests: No results for input(s): AST, ALT, ALKPHOS, BILITOT, PROT, ALBUMIN in the last 168 hours. No results for input(s): LIPASE, AMYLASE in the last 168 hours. No results for input(s): AMMONIA in the last 168 hours. Coagulation Profile: No results for input(s): INR, PROTIME in the last 168 hours. Cardiac Enzymes:  Recent Labs Lab 11/01/16 1702  TROPONINI <0.03   BNP (last 3 results) No results for input(s): PROBNP in the last 8760 hours. HbA1C: No results for input(s): HGBA1C in the last 72 hours. CBG: No results for input(s): GLUCAP in the last 168 hours. Lipid Profile: No results for input(s): CHOL, HDL, LDLCALC, TRIG, CHOLHDL, LDLDIRECT in the last 72 hours. Thyroid Function Tests: No results for input(s): TSH, T4TOTAL, FREET4, T3FREE, THYROIDAB  in the last 72 hours. Anemia Panel: No results for input(s): VITAMINB12, FOLATE, FERRITIN, TIBC, IRON, RETICCTPCT in the last 72 hours. Urine analysis:    Component Value Date/Time   COLORURINE STRAW (A) 09/20/2016 1216   APPEARANCEUR CLEAR 09/20/2016 1216   LABSPEC 1.025 10/03/2016 1002   PHURINE 7.0 10/03/2016 1002   GLUCOSEU NEGATIVE 10/03/2016 1002   HGBUR NEGATIVE 10/03/2016 1002   BILIRUBINUR NEGATIVE 10/03/2016 1002   KETONESUR NEGATIVE 10/03/2016 1002   PROTEINUR 30 (A) 10/03/2016 1002   UROBILINOGEN 1.0 10/03/2016 1002   NITRITE NEGATIVE 10/03/2016 1002   LEUKOCYTESUR NEGATIVE 10/03/2016 1002    Radiological Exams on Admission: Dg Chest 2 View  Result Date: 11/01/2016 CLINICAL DATA:  Shortness of breath EXAM: CHEST  2 VIEW COMPARISON:  09/26/2016 FINDINGS: Right upper lobe scarring. Right basilar scarring/ atelectasis. Hyperinflation. No focal consolidation. No  pleural effusion or pneumothorax. The heart is normal in size. Visualized osseous structures are within normal limits. IMPRESSION: No evidence of acute cardiopulmonary disease. Electronically Signed   By: Charline Bills M.D.   On: 11/01/2016 17:39    EKG: Independently reviewed. Vent. rate 108 BPM PR interval * ms QRS duration 77 ms QT/QTc 343/460 ms P-R-T axes 86 76 71 Age not entered, assumed to be 61 years old for purpose of ECG interpretation Sinus tachycardia Biatrial enlargement Anteroseptal infarct, age indeterminate  Assessment/Plan Principal Problem:   COPD exacerbation (HCC) Observation/telemetry. Continue supplemental oxygen. Continue scheduled and as needed bronchodilators. Switch Zithromax to oral tablets. Extra dose of Solu-Medrol 125 mg IVP was given in the emergency department.  Active Problems:   Essential hypertension Continue amlodipine 10 mg by mouth daily. Continue Avapro 37.5 mg by mouth daily. Monitor blood pressure, electrolytes and renal function.    Hypercholesterolemia Currently not on meds. Continue lifestyle modifications.    DVT prophylaxis: Lovenox SQ. Code Status: Full complete. Family Communication:  Disposition Plan: Admit for COPD exacerbation treatment for 24-48 hours. Consults called:  Admission status: Observation/telemetry.   Bobette Mo MD [facility] Pager [phone].  If 7PM-7AM, please contact night-coverage [email or web address] Password [email or web address]  11/01/2016, 9:47 PM

## 2016-11-01 NOTE — ED Notes (Signed)
Bed: WA23 Expected date:  Expected time:  Means of arrival:  Comments: EMS-SOB 

## 2016-11-01 NOTE — ED Notes (Signed)
O2 remain 100% on 2L [place] while ambulating

## 2016-11-01 NOTE — ED Triage Notes (Signed)
[facility]. Pt states he has been short of breath for few days, tried his home breathing Tx with O2, but his breathing gotten worse. EMS pt initial SpO2-95% on room air but pt not able to speak full sentences. EMS given 125mg  solumedrol and duoneb x3, 5mg  each.

## 2016-11-01 NOTE — ED Notes (Signed)
Respiratory therapist notified of continuous neb.

## 2016-11-02 DIAGNOSIS — J441 Chronic obstructive pulmonary disease with (acute) exacerbation: Secondary | ICD-10-CM

## 2016-11-02 DIAGNOSIS — I10 Essential (primary) hypertension: Secondary | ICD-10-CM | POA: Diagnosis present

## 2016-11-02 DIAGNOSIS — Z9981 Dependence on supplemental oxygen: Secondary | ICD-10-CM | POA: Diagnosis not present

## 2016-11-02 DIAGNOSIS — Z888 Allergy status to other drugs, medicaments and biological substances status: Secondary | ICD-10-CM | POA: Diagnosis not present

## 2016-11-02 DIAGNOSIS — E43 Unspecified severe protein-calorie malnutrition: Secondary | ICD-10-CM | POA: Diagnosis present

## 2016-11-02 DIAGNOSIS — R0603 Acute respiratory distress: Secondary | ICD-10-CM | POA: Diagnosis not present

## 2016-11-02 DIAGNOSIS — J439 Emphysema, unspecified: Secondary | ICD-10-CM | POA: Diagnosis not present

## 2016-11-02 DIAGNOSIS — Z87891 Personal history of nicotine dependence: Secondary | ICD-10-CM | POA: Diagnosis not present

## 2016-11-02 DIAGNOSIS — R21 Rash and other nonspecific skin eruption: Secondary | ICD-10-CM | POA: Diagnosis present

## 2016-11-02 DIAGNOSIS — E7800 Pure hypercholesterolemia, unspecified: Secondary | ICD-10-CM | POA: Diagnosis present

## 2016-11-02 DIAGNOSIS — Z681 Body mass index (BMI) 19 or less, adult: Secondary | ICD-10-CM | POA: Diagnosis not present

## 2016-11-02 LAB — COMPREHENSIVE METABOLIC PANEL
ALBUMIN: 3.8 g/dL (ref 3.5–5.0)
ALK PHOS: 40 U/L (ref 38–126)
ALT: 13 U/L — ABNORMAL LOW (ref 17–63)
ANION GAP: 10 (ref 5–15)
AST: 26 U/L (ref 15–41)
BILIRUBIN TOTAL: 0.7 mg/dL (ref 0.3–1.2)
BUN: 13 mg/dL (ref 6–20)
CALCIUM: 9.5 mg/dL (ref 8.9–10.3)
CO2: 28 mmol/L (ref 22–32)
Chloride: 96 mmol/L — ABNORMAL LOW (ref 101–111)
Creatinine, Ser: 0.63 mg/dL (ref 0.61–1.24)
GLUCOSE: 207 mg/dL — AB (ref 65–99)
POTASSIUM: 4.7 mmol/L (ref 3.5–5.1)
Sodium: 134 mmol/L — ABNORMAL LOW (ref 135–145)
TOTAL PROTEIN: 6.8 g/dL (ref 6.5–8.1)

## 2016-11-02 LAB — CBC WITH DIFFERENTIAL/PLATELET
BASOS PCT: 0 %
Basophils Absolute: 0 10*3/uL (ref 0.0–0.1)
Eosinophils Absolute: 0 10*3/uL (ref 0.0–0.7)
Eosinophils Relative: 0 %
HEMATOCRIT: 36.7 % — AB (ref 39.0–52.0)
HEMOGLOBIN: 11.9 g/dL — AB (ref 13.0–17.0)
LYMPHS ABS: 0.9 10*3/uL (ref 0.7–4.0)
LYMPHS PCT: 13 %
MCH: 22.9 pg — AB (ref 26.0–34.0)
MCHC: 32.4 g/dL (ref 30.0–36.0)
MCV: 70.7 fL — AB (ref 78.0–100.0)
MONO ABS: 0.4 10*3/uL (ref 0.1–1.0)
MONOS PCT: 5 %
NEUTROS ABS: 5.9 10*3/uL (ref 1.7–7.7)
NEUTROS PCT: 82 %
Platelets: 282 10*3/uL (ref 150–400)
RBC: 5.19 MIL/uL (ref 4.22–5.81)
RDW: 15.2 % (ref 11.5–15.5)
WBC: 7.2 10*3/uL (ref 4.0–10.5)

## 2016-11-02 MED ORDER — IPRATROPIUM-ALBUTEROL 0.5-2.5 (3) MG/3ML IN SOLN
3.0000 mL | Freq: Four times a day (QID) | RESPIRATORY_TRACT | Status: DC
  Administered 2016-11-02 – 2016-11-03 (×7): 3 mL via RESPIRATORY_TRACT
  Filled 2016-11-02 (×7): qty 3

## 2016-11-02 MED ORDER — DEXTROSE 5 % IV SOLN
500.0000 mg | Freq: Once | INTRAVENOUS | Status: DC
  Filled 2016-11-02: qty 500

## 2016-11-02 MED ORDER — HYDROXYZINE HCL 10 MG PO TABS
10.0000 mg | ORAL_TABLET | Freq: Three times a day (TID) | ORAL | Status: DC | PRN
  Administered 2016-11-02: 10 mg via ORAL
  Filled 2016-11-02: qty 1

## 2016-11-02 MED ORDER — AZITHROMYCIN 250 MG PO TABS
250.0000 mg | ORAL_TABLET | Freq: Every day | ORAL | Status: DC
  Administered 2016-11-02 – 2016-11-03 (×2): 250 mg via ORAL
  Filled 2016-11-02 (×2): qty 1

## 2016-11-02 MED ORDER — AMLODIPINE BESYLATE 10 MG PO TABS
10.0000 mg | ORAL_TABLET | Freq: Every day | ORAL | Status: DC
  Administered 2016-11-02: 10 mg via ORAL
  Filled 2016-11-02: qty 1

## 2016-11-02 MED ORDER — ALBUTEROL SULFATE (2.5 MG/3ML) 0.083% IN NEBU: 2.5000 mg | INHALATION_SOLUTION | RESPIRATORY_TRACT | Status: DC | PRN

## 2016-11-02 MED ORDER — PANTOPRAZOLE SODIUM 40 MG PO TBEC
40.0000 mg | DELAYED_RELEASE_TABLET | Freq: Every day | ORAL | Status: DC
  Administered 2016-11-02: 40 mg via ORAL
  Filled 2016-11-02: qty 1

## 2016-11-02 MED ORDER — ONDANSETRON HCL 4 MG/2ML IJ SOLN: 4.0000 mg | Freq: Four times a day (QID) | INTRAMUSCULAR | Status: DC | PRN

## 2016-11-02 MED ORDER — ENOXAPARIN SODIUM 40 MG/0.4ML ~~LOC~~ SOLN
40.0000 mg | SUBCUTANEOUS | Status: DC
  Administered 2016-11-02 – 2016-11-03 (×2): 40 mg via SUBCUTANEOUS
  Filled 2016-11-02 (×2): qty 0.4

## 2016-11-02 MED ORDER — ALBUTEROL SULFATE (2.5 MG/3ML) 0.083% IN NEBU
2.5000 mg | INHALATION_SOLUTION | Freq: Four times a day (QID) | RESPIRATORY_TRACT | Status: DC
  Administered 2016-11-02: 2.5 mg via RESPIRATORY_TRACT
  Filled 2016-11-02: qty 3

## 2016-11-02 MED ORDER — IPRATROPIUM BROMIDE 0.02 % IN SOLN
0.5000 mg | Freq: Four times a day (QID) | RESPIRATORY_TRACT | Status: DC
  Administered 2016-11-02: 0.5 mg via RESPIRATORY_TRACT
  Filled 2016-11-02: qty 2.5

## 2016-11-02 MED ORDER — METHYLPREDNISOLONE SODIUM SUCC 125 MG IJ SOLR
60.0000 mg | Freq: Four times a day (QID) | INTRAMUSCULAR | Status: DC
  Administered 2016-11-02: 60 mg via INTRAVENOUS
  Filled 2016-11-02: qty 2

## 2016-11-02 MED ORDER — ENSURE ENLIVE PO LIQD
237.0000 mL | Freq: Two times a day (BID) | ORAL | Status: DC
  Administered 2016-11-03 (×2): 237 mL via ORAL

## 2016-11-02 MED ORDER — ONDANSETRON HCL 4 MG PO TABS: 4.0000 mg | ORAL_TABLET | Freq: Four times a day (QID) | ORAL | Status: DC | PRN

## 2016-11-02 MED ORDER — MUPIROCIN CALCIUM 2 % EX CREA
TOPICAL_CREAM | Freq: Two times a day (BID) | CUTANEOUS | Status: DC
  Administered 2016-11-02 – 2016-11-03 (×3): via TOPICAL
  Filled 2016-11-02: qty 15

## 2016-11-02 MED ORDER — METHYLPREDNISOLONE SODIUM SUCC 125 MG IJ SOLR
60.0000 mg | Freq: Two times a day (BID) | INTRAMUSCULAR | Status: DC
  Administered 2016-11-02 – 2016-11-03 (×2): 60 mg via INTRAVENOUS
  Filled 2016-11-02 (×2): qty 2

## 2016-11-02 MED ORDER — IPRATROPIUM-ALBUTEROL 0.5-2.5 (3) MG/3ML IN SOLN
3.0000 mL | Freq: Four times a day (QID) | RESPIRATORY_TRACT | Status: DC
Start: 2016-11-02 — End: 2016-11-02

## 2016-11-02 MED ORDER — IRBESARTAN 75 MG PO TABS
37.5000 mg | ORAL_TABLET | Freq: Every day | ORAL | Status: DC
  Administered 2016-11-02 – 2016-11-03 (×2): 37.5 mg via ORAL
  Filled 2016-11-02 (×2): qty 1

## 2016-11-02 NOTE — Progress Notes (Signed)
Initial Nutrition Assessment  DOCUMENTATION CODES:   Underweight, Severe malnutrition in context of chronic illness  INTERVENTION:   Ensure Enlive po BID, each supplement provides 350 kcal and 20 grams of protein  NUTRITION DIAGNOSIS:   Malnutrition (Severe) related to chronic illness (COPD) as evidenced by severe depletion of body fat, severe depletion of muscle mass, 13 % weight loss in two months.   GOAL:   Patient will meet greater than or equal to 90% of their needs  MONITOR:   PO intake, Supplement acceptance, Weight trends  REASON FOR ASSESSMENT:   Consult Assessment of nutrition requirement/status  ASSESSMENT:   Pt with PMH of HTN and emphysema. Presents this admission with COPD exacerbation.   Pt consuming 100% at meal times this admission. Reports having no known decreased in appetite, eating 3 meals per day that includes all food groups. States for three weeks prior to admission, he would throw up dinner the next morning due to feelings of "mucus build up." With vomiting, suspect pt is not meeting estimated energy requirements for the 3 weeks the vomiting episodes were present.   Reports a wt loss of 12 lbs since May 2018 from his UBW of 120 lbs. This is a 13 % wt loss in 2 months which is significant for malnutrition. Records reflect pt weighed 109 lb in June admission (a 4.5% wt loss in 1 month). Pt claims his over active thyroid is causing this sudden wt loss, which he is not currently medicated for.    Nutrition-Focused physical exam completed. Findings are severe fat depletion in upper arm and thoracic/lumbar regions, severe muscle depletion in upper and lower extremites, and no edema.   Medications reviewed and include: IV abx, solumedrol Labs reviewed: Na 134 (L), CBG 99-207  Diet Order:  Diet Heart Room service appropriate? Yes; Fluid consistency: Thin  Skin:  Reviewed, no issues  Last BM:  11/01/16  Height:   Ht Readings from Last 1 Encounters:   11/01/16 5\' 4"  (1.626 m)    Weight:   Wt Readings from Last 1 Encounters:  11/01/16 104 lb (47.2 kg)    Ideal Body Weight:  59.1 kg  BMI:  Body mass index is 17.85 kg/m.  Estimated Nutritional Needs:   Kcal:  1500-1700 (31-36 kcal/kg)  Protein:  85-95 grams (1.8-2 g/kg)  Fluid:  >1.5 L/day   EDUCATION NEEDS:   No education needs identified at this time  Vanessa Kickarly Krayton Wortley RD, LDN Clinical Nutrition Pager # - [phone]

## 2016-11-02 NOTE — Discharge Instructions (Signed)

## 2016-11-02 NOTE — Progress Notes (Signed)
PROGRESS NOTE    Matthew Morrison  [id number] DOB: 07/24/1955 DOA: 11/01/2016 PCP: Bing NeighborsHarris, Kimberly S, FNP   Brief Narrative: Chief Complaint: Shortness of breath.  HPI: Matthew Morrison is a 61 y.o. male with medical history significant of asthma,/emphysema, hypertension and was coming to the emergency department with complaints of progressively worse dyspnea since "Sunday when he was exposed to a foul smell in his apartment complexes launder room. He denies fever, chills, but complains of progressively worse dyspnea, wheezing and pleuritic chest pain. He was unable to speak in full sentences.  Patient's quit smoking 2 months ago. He is on 2 L of O2 at night, and as needed during the day.  Assessment & Plan:   Principal Problem:   COPD exacerbation (HCC) Active Problems:   Essential hypertension   Hypercholesterolemia   COPD exacerbation (HCC)- with wheezing persistent today. Shortness of breath has improved. Saturating 95-100% on 2 L of nasal cannula. - Continue by mouth azithromycin - Cont IV steroids for today    Essential hypertension Continue amlodipine 10 mg by mouth daily. Continue Avapro 37.5 mg by mouth daily. Monitor blood pressure, electrolytes and renal function.    Hypercholesterolemia Currently not on meds. Continue lifestyle modifications.  Ear rash- dry skin, right ear, patient reports some drainage a few days ago, none today. - Mupirocin cream  Severe malnutrition- with weight loss, pain nutritionist 12 pound weight loss since May.  - Follow up with pcp - Apparently being worked up for thyroid pathology, I can't view results in EPIC.  DVT prophylaxis: Lovenox SQ. Code Status: Full complete. Family Communication:  Disposition Plan: Admit for COPD exacerbation treatment for 24-48 hours. Consults called:  Admission status: Observation/telemetry.   Consultants:   None  Procedures:  None   Antimicrobials: (specify start and planned stop date. Auto  populated tables are space occupying and do not give end dates)  Azithro 11/01/16>>  Subjective: Patient reports breathing has improved, still has wheezing. Reports drainage from right ear a few days ago.  Objective: Vitals:   11/02/16 0115 11/02/16 0208 11/02/16 0343 11/02/16 0845  BP: 98/72 120/86    Pulse: 84 90  78  Resp: 14 20  18  Temp:  98.1 F (36.7 C)    TempSrc:  Oral    SpO2: 97% 98% 98% 95%  Weight:      Height:        Intake/Output Summary (Last 24 hours) at 11/02/16 1406 Last data filed at 11/02/16 0900  Gross per 24 hour  Intake              540 ml  Output                0 ml  Net              54" 0 ml   Filed Weights   11/01/16 1645  Weight: 47.2 kg (104 lb)    Examination:  General exam: Appears calm and comfortable, thin Respiratory system:Diffuse expiratory wheeze Cardiovascular system: S1 & S2 heard, RRR. No JVD, murmurs, rubs, gallops or clicks. No pedal edema. Gastrointestinal system: Abdomen is nondistended, soft and nontender. No organomegaly or masses felt. Normal bowel sounds heard. Central nervous system: Alert and oriented. No focal neurological deficits. Extremities: Symmetric 5 x 5 power. Skin: Dry skin than right ear with scaling, possibly area of prior drainage underneath helix, of right ear Psychiatry: Judgement and insight appear normal. Mood & affect appropriate.   Data Reviewed: I have personally reviewed  following labs and imaging studies  CBC:  Recent Labs Lab 11/01/16 1702 11/02/16 0613  WBC 8.5 7.2  NEUTROABS  --  5.9  HGB 13.5 11.9*  HCT 40.2 36.7*  MCV 70.0* 70.7*  PLT 329 282   Basic Metabolic Panel:  Recent Labs Lab 11/01/16 1702 11/01/16 2133 11/02/16 0613  NA 135  --  134*  K 3.5  --  4.7  CL 95*  --  96*  CO2 28  --  28  GLUCOSE 99  --  207*  BUN 7  --  13  CREATININE 0.65  --  0.63  CALCIUM 9.6  --  9.5  MG  --  2.0  --   PHOS  --  4.0  --    Liver Function Tests:  Recent Labs Lab  11/02/16 0613  AST 26  ALT 13*  ALKPHOS 40  BILITOT 0.7  PROT 6.8  ALBUMIN 3.8   Cardiac Enzymes:  Recent Labs Lab 11/01/16 1702  TROPONINI <0.03   Radiology Studies: Dg Chest 2 View  Result Date: 11/01/2016 CLINICAL DATA:  Shortness of breath EXAM: CHEST  2 VIEW COMPARISON:  09/26/2016 FINDINGS: Right upper lobe scarring. Right basilar scarring/ atelectasis. Hyperinflation. No focal consolidation. No pleural effusion or pneumothorax. The heart is normal in size. Visualized osseous structures are within normal limits. IMPRESSION: No evidence of acute cardiopulmonary disease. Electronically Signed   By: Charline BillsSriyesh  Krishnan M.D.   On: 11/01/2016 17:39   Scheduled Meds: . amLODipine  10 mg Oral Daily  . azithromycin  250 mg Oral Daily  . enoxaparin (LOVENOX) injection  40 mg Subcutaneous Q24H  . feeding supplement (ENSURE ENLIVE)  237 mL Oral BID BM  . ipratropium-albuterol  3 mL Nebulization QID  . irbesartan  37.5 mg Oral Daily  . methylPREDNISolone (SOLU-MEDROL) injection  125 mg Intravenous Once  . methylPREDNISolone (SOLU-MEDROL) injection  60 mg Intravenous Q6H  . mupirocin cream   Topical BID  . pantoprazole  40 mg Oral Daily   Continuous Infusions: . azithromycin       LOS: 0 days   Onnie BoerEjiroghene E Zubayr Bednarczyk, MD [facility] Pager [phone]  If 7PM-7AM, please contact night-coverage [email or web address] Password [email or web address] 11/02/2016, 2:06 PM

## 2016-11-02 NOTE — Care Management Note (Signed)
Case Management Note  Patient Details  Name: Matthew Morrison MRN: [id number] Date of Birth: November 21, 1955  Subjective/Objective:     Asthma exacerbation               Action/Plan: Date:  November 02, 2016 Chart reviewed for concurrent status and case management needs. Will continue to follow patient progress. Discharge Planning: following for needs Expected discharge date: [id number] Matthew Morrison, BSN, [place]3, [place]   [phone]  Expected Discharge Date:   (unknown)               Expected Discharge Plan:  Home/Self Care  In-House Referral:     Discharge planning Services  CM Consult  Post Acute Care Choice:    Choice offered to:     DME Arranged:    DME Agency:     HH Arranged:    HH Agency:     Status of Service:  In process, will continue to follow  If discussed at Long Length of Stay Meetings, dates discussed:    Additional Comments:  Matthew Morrison, Matthew Morrison Lynn, RN 11/02/2016, 8:50 AM

## 2016-11-03 LAB — HEMOGLOBIN AND HEMATOCRIT, BLOOD
HEMATOCRIT: 40.1 % (ref 39.0–52.0)
Hemoglobin: 13.1 g/dL (ref 13.0–17.0)

## 2016-11-03 MED ORDER — MUPIROCIN CALCIUM 2 % EX CREA: TOPICAL_CREAM | Freq: Two times a day (BID) | CUTANEOUS | 0 refills | Status: AC

## 2016-11-03 MED ORDER — PREDNISONE 20 MG PO TABS: 40.0000 mg | ORAL_TABLET | Freq: Every day | ORAL | 0 refills | Status: AC

## 2016-11-03 MED ORDER — AZITHROMYCIN 250 MG PO TABS: ORAL_TABLET | ORAL | 0 refills | Status: DC

## 2016-11-03 MED FILL — AZITHROMYCIN 250 MG TABLET: 250 | 3 days supply | Qty: 3 | Fill #0

## 2016-11-03 MED FILL — predniSONE 20 MG TABS: 20 | 3 days supply | Qty: 6 | Fill #0

## 2016-11-03 NOTE — Progress Notes (Signed)
SATURATION QUALIFICATIONS: (This note is used to comply with regulatory documentation for home oxygen)  Patient Saturations on 2 L of Oxygen at Rest = 96%  Patient Saturations on Room Air while Ambulating =   Patient Saturations on 2 Liters of oxygen while Ambulating = 83-85%  Please briefly explain why patient needs home oxygen:

## 2016-11-03 NOTE — Progress Notes (Signed)
Date:  November 03, 2016 Brad with [facility] notified of patient need for travel o2 has home o2 through [facility]. Marcelle Smilinghonda Mariana Goytia, BSN, [place], [place]   [phone]

## 2016-11-03 NOTE — Discharge Summary (Signed)
Physician Discharge Summary  Sumeet Geter ZOX:[id number] DOB: Apr 04, 1956 DOA: 11/01/2016  PCP: Bing Neighbors, FNP  Admit date: 11/01/2016 Discharge date: 11/03/2016  Admitted From: Home  Disposition: Home  Recommendations for Outpatient Follow-up:  1. Follow up with PCP in 1 week- blood pressure check, COPD, weight loss.  Home Health: No  Equipment/Devices: Oxygen  Discharge Condition: Stable  CODE STATUS:  Full  Diet recommendation: Heart Healthy   HPI on admission- By Dr. Robb Matar Chief Complaint: Shortness of breath.  HPI: Matthew Morrison is a 61 y.o. male with medical history significant of asthma,/emphysema, hypertension and was coming to the emergency department with complaints of progressively worse dyspnea since Sunday when he was exposed to a foul smell in his apartment complexes launder room. He denies fever, chills, but complains of progressively worse dyspnea, wheezing and pleuritic chest pain. Today, his symptoms were so intense, that he has his brother to call EMS. He denies headache, sore throat, productive cough, abdominal pain, nausea, emesis, diarrhea, constipation, melena or hematochezia. He denies dysuria, frequency or hematuria. EMS gave him DuoNeb 3 and Solu-Medrol 125 mg IVP there ED triage notes. ED Course: Initial vital signs temperature 36.7, pulse 106, respirations 21. Blood pressure 103/79. He was saturating at 100% on nasal cannula oxygen. He was unable to speak in full sentences. His CBC, troponin level and BMP were normal. The patient was given a continuous 10 mg albuterol neb treatment and IV azithromycin. His chest radiograph did not show any acute pathology.  Discharge Diagnoses:  Principal Problem:   COPD exacerbation (HCC) Active Problems:   Essential hypertension    COPD exacerbation (HCC)- with history of COPD, stage IV . Wheezing and shortness of breath , on admission that improved with scheduled bronchodilators and IV methylprednisolone, and  azithromycin. Chest x-ray on admission no acute cardiopulmonary process, WBC- 8.5 on admission, remained stable. Patient quit smoking ~2 months ago- continued abstinence was encouraged. Patient was continued on his home 2 L O2 nasal cannula- with O2 saturating 95-100%, at rest, with increased O2 requirement to 3 L with ambulation to maintain O2 sats at 95%. Patient was told to need 2 L of O2 with ambulation. Patient was discharged to follow-up with PCP in 1 week, and to complete 3 more days of azithromycin and prednisone, for total of 5 days of antibiotics and steroids.  Essential hypertension- blood pressure in the low 100s on admission while on home antihypertensives if Avapro and Norvasc. Patient's Lovenox was DC'd prior to discharge, with soft blood pressures on admission. Patient to follow-up with PCP, for the pressure medication titration as indicated.  Ear rash- dry skin, right ear, patient reports some drainage a few days ago, none and then patient. Typical Mupirocin cream prescribed on admission continued on discharge.  Severe malnutrition- with weight loss, with reported 12 pound weight loss since May. Nutritionist was consulted inpatient. Continue additional supplements Ensure on discharge. Patient to follow-up with PCP for continuation of weight loss workup.  Discharge Instructions  Discharge Instructions    Diet - low sodium heart healthy    Complete by:  As directed    Discharge instructions    Complete by:  As directed    Stop taking your Norvasc for now till you see your primary care doctor and have your blood pressure rechecked. This is because your blood pressure was on the low side while on admission.   Increase activity slowly    Complete by:  As directed      Allergies  as of 11/03/2016      Reactions   Norflex [orphenadrine] Hives      Medication List    STOP taking these medications   amLODipine 10 MG tablet Commonly known as:  NORVASC   pantoprazole 40 MG  tablet Commonly known as:  PROTONIX     TAKE these medications   albuterol (2.5 MG/3ML) 0.083% nebulizer solution Commonly known as:  PROVENTIL Take 3 mLs (2.5 mg total) by nebulization 4 (four) times daily.   albuterol 108 (90 Base) MCG/ACT inhaler Commonly known as:  PROVENTIL HFA;VENTOLIN HFA Inhale 2 puffs into the lungs every 6 (six) hours as needed for wheezing or shortness of breath.   azithromycin 250 MG tablet Commonly known as:  ZITHROMAX Once daily.   dextromethorphan-guaiFENesin 30-600 MG 12hr tablet Commonly known as:  MUCINEX DM Take 2 tablets by mouth 2 (two) times daily.   feeding supplement Liqd Take 1 Container by mouth 3 (three) times daily between meals.   hydrOXYzine 10 MG tablet Commonly known as:  ATARAX/VISTARIL Take 1 tablet (10 mg total) by mouth 3 (three) times daily as needed for anxiety.   ipratropium 0.02 % nebulizer solution Commonly known as:  ATROVENT Take 2.5 mLs (0.5 mg total) by nebulization 4 (four) times daily.   irbesartan 75 MG tablet Commonly known as:  AVAPRO Take 0.5 tablets (37.5 mg total) by mouth daily.   morphine 15 MG tablet Commonly known as:  MSIR Take 0.5 tablets (7.5 mg total) by mouth every 4 (four) hours as needed (shortness of breath).   mupirocin cream 2 % Commonly known as:  BACTROBAN Apply topically 2 (two) times daily.   nicotine 14 mg/24hr patch Commonly known as:  NICODERM CQ - dosed in mg/24 hours Place 1 patch (14 mg total) onto the skin daily.   OXYGEN 2 lpm with sleep and occ during the day   polyethylene glycol packet Commonly known as:  MIRALAX / GLYCOLAX Take 17 g by mouth daily.   predniSONE 20 MG tablet Commonly known as:  DELTASONE Take 2 tablets (40 mg total) by mouth daily with breakfast.   senna-docusate 8.6-50 MG tablet Commonly known as:  Senokot-S Take 1 tablet by mouth 2 (two) times daily.   Tiotropium Bromide-Olodaterol 2.5-2.5 MCG/ACT Aers Commonly known as:  STIOLTO  RESPIMAT Inhale 2 puffs into the lungs daily.       Allergies  Allergen Reactions  . Norflex [Orphenadrine] Hives    Consultations:  None  Procedures/Studies: Dg Chest 2 View  Result Date: 11/01/2016 CLINICAL DATA:  Shortness of breath EXAM: CHEST  2 VIEW COMPARISON:  09/26/2016 FINDINGS: Right upper lobe scarring. Right basilar scarring/ atelectasis. Hyperinflation. No focal consolidation. No pleural effusion or pneumothorax. The heart is normal in size. Visualized osseous structures are within normal limits. IMPRESSION: No evidence of acute cardiopulmonary disease. Electronically Signed   By: Charline BillsSriyesh  Krishnan M.D.   On: 11/01/2016 17:39     Subjective: No complaints today ready to go home. Breathing much improved.  Discharge Exam: Vitals:   11/03/16 0821 11/03/16 1148  BP:    Pulse: 91 89  Resp: 18 18  Temp:     Vitals:   11/02/16 2134 11/03/16 0556 11/03/16 0821 11/03/16 1148  BP: (!) 82/59 111/80    Pulse: 78 85 91 89  Resp: 18 19 18 18   Temp: 98.3 F (36.8 C) 97.8 F (36.6 C)    TempSrc: Oral Oral    SpO2: 98% 99% 100% 100%  Weight:  Height:        General: Pt is alert, awake, not in acute distress Cardiovascular: RRR, S1/S2 +, no rubs, no gallops Respiratory: Minimal expiratory wheezing diffuse- patient reports he always wheezes, no crackles Abdominal: Soft, NT, ND, bowel sounds + Extremities: no edema, no cyanosis  The results of significant diagnostics from this hospitalization (including imaging, microbiology, ancillary and laboratory) are listed below for reference.    Labs: Basic Metabolic Panel:  Recent Labs Lab 11/01/16 1702 11/01/16 2133 11/02/16 0613  NA 135  --  134*  K 3.5  --  4.7  CL 95*  --  96*  CO2 28  --  28  GLUCOSE 99  --  207*  BUN 7  --  13  CREATININE 0.65  --  0.63  CALCIUM 9.6  --  9.5  MG  --  2.0  --   PHOS  --  4.0  --    Liver Function Tests:  Recent Labs Lab 11/02/16 0613  AST 26  ALT 13*  ALKPHOS  40  BILITOT 0.7  PROT 6.8  ALBUMIN 3.8   CBC:  Recent Labs Lab 11/01/16 1702 11/02/16 0613 11/03/16 0722  WBC 8.5 7.2  --   NEUTROABS  --  5.9  --   HGB 13.5 11.9* 13.1  HCT 40.2 36.7* 40.1  MCV 70.0* 70.7*  --   PLT 329 282  --    Cardiac Enzymes:  Recent Labs Lab 11/01/16 1702  TROPONINI <0.03   Urinalysis    Component Value Date/Time   COLORURINE STRAW (A) 09/20/2016 1216   APPEARANCEUR CLEAR 09/20/2016 1216   LABSPEC 1.025 10/03/2016 1002   PHURINE 7.0 10/03/2016 1002   GLUCOSEU NEGATIVE 10/03/2016 1002   HGBUR NEGATIVE 10/03/2016 1002   BILIRUBINUR NEGATIVE 10/03/2016 1002   KETONESUR NEGATIVE 10/03/2016 1002   PROTEINUR 30 (A) 10/03/2016 1002   UROBILINOGEN 1.0 10/03/2016 1002   NITRITE NEGATIVE 10/03/2016 1002   LEUKOCYTESUR NEGATIVE 10/03/2016 1002    Time coordinating discharge: Over 30 minutes  SIGNED:  Onnie BoerEjiroghene E Emokpae, MD  [facility] 11/03/2016, 3:25 PM Pager [phone]  If 7PM-7AM, please contact night-coverage [email or web address] Password [email or web address]

## 2016-11-03 NOTE — Progress Notes (Signed)
SATURATION QUALIFICATIONS: (This note is used to comply with regulatory documentation for home oxygen)  Patient Saturations on Room Air at Rest = %  Patient Saturations on Room Air while Ambulating = %  Patient Saturations on 3 Liters of oxygen while Ambulating = 95%  Please briefly explain why patient needs home oxygen:

## 2016-11-11 ENCOUNTER — Ambulatory Visit (INDEPENDENT_AMBULATORY_CARE_PROVIDER_SITE_OTHER): Payer: Medicaid Other | Admitting: Family Medicine

## 2016-11-11 ENCOUNTER — Encounter: Payer: Self-pay | Admitting: Family Medicine

## 2016-11-11 VITALS — BP 110/84 | HR 96 | Temp 98.8°F | Resp 16 | Ht 64.0 in | Wt 107.0 lb

## 2016-11-11 DIAGNOSIS — J441 Chronic obstructive pulmonary disease with (acute) exacerbation: Secondary | ICD-10-CM

## 2016-11-11 MED ORDER — METHYLPREDNISOLONE SODIUM SUCC 125 MG IJ SOLR: 62.5000 mg | Freq: Once | INTRAMUSCULAR | Status: DC

## 2016-11-11 MED ORDER — ALBUTEROL SULFATE HFA 108 (90 BASE) MCG/ACT IN AERS: 2.0000 | INHALATION_SPRAY | RESPIRATORY_TRACT | 3 refills | Status: DC | PRN

## 2016-11-11 MED ORDER — PREDNISONE 20 MG PO TABS: 20.0000 mg | ORAL_TABLET | Freq: Every day | ORAL | 0 refills | Status: DC

## 2016-11-11 MED ORDER — METHYLPREDNISOLONE SODIUM SUCC 125 MG IJ SOLR
62.5000 mg | Freq: Once | INTRAMUSCULAR | Status: AC
  Administered 2016-11-11: 62.5 mg via INTRAMUSCULAR

## 2016-11-11 MED FILL — predniSONE 20 MG TABS: 20 | 30 days supply | Qty: 30 | Fill #0

## 2016-11-11 MED FILL — PROAIR HFA 90 MCG INHALER: 108 (90 BAS | 16 days supply | Qty: 9 | Fill #0

## 2016-11-11 NOTE — Patient Instructions (Signed)
Start prednisone 20 mg daily tomorrow. Continue nebulizer treatments 4 times daily as prescribed. Continue use of inhaler as prescribed. For now, do not resume your blood pressure medication.     Chronic Obstructive Pulmonary Disease Exacerbation Chronic obstructive pulmonary disease (COPD) is a common lung problem. In COPD, the flow of air from the lungs is limited. COPD exacerbations are times that breathing gets worse and you need extra treatment. Without treatment they can be life threatening. If they happen often, your lungs can become more damaged. If your COPD gets worse, your doctor may treat you with:  Medicines.  Oxygen.  Different ways to clear your airway, such as using a mask.  Follow these instructions at home:  Do not smoke.  Avoid tobacco smoke and other things that bother your lungs.  If given, take your antibiotic medicine as told. Finish the medicine even if you start to feel better.  Only take medicines as told by your doctor.  Drink enough fluids to keep your pee (urine) clear or pale yellow (unless your doctor has told you not to).  Use a cool mist machine (vaporizer).  If you use oxygen or a machine that turns liquid medicine into a mist (nebulizer), continue to use them as told.  Keep up with shots (vaccinations) as told by your doctor.  Exercise regularly.  Eat healthy foods.  Keep all doctor visits as told. Get help right away if:  You are very short of breath and it gets worse.  You have trouble talking.  You have bad chest pain.  You have blood in your spit (sputum).  You have a fever.  You keep throwing up (vomiting).  You feel weak, or you pass out (faint).  You feel confused.  You keep getting worse. This information is not intended to replace advice given to you by your health care provider. Make sure you discuss any questions you have with your health care provider. Document Released: 03/10/2011 Document Revised: 08/27/2015  Document Reviewed: 11/23/2012 Elsevier Interactive Patient Education  2017 [employer]

## 2016-11-11 NOTE — Progress Notes (Signed)
Patient ID: Matthew Morrison, male    DOB: [birth year], 61 y.o.   MRN: [id number]  PCP: Bing NeighborsHarris, Ivet Guerrieri S, FNP  Chief Complaint  Patient presents with  . Wheezing  . COPD    Subjective:  HPI Matthew Morrison is a 61 y.o. male presents for evaluation of  COPD-endstage and chronic wheezing. Matthew Morrison was recently hospitalized for COPD exacerbation. He has been admitted twice in less than 2 months for COPD exacerbation and has had an additional 3 ED visits for the same diagnoses. He was discharged from the hospital most recently on 11/03/2016 and reports that he is still experiencing some shortness of breath and persistent wheezing. He currently has a nebulizer machine and is prescribed neb treatments every 6 hours. He has also been prescribed azithromycin and prednisone at discharge which she has finished both as of today. He reports that he is not sleeping at all due to the persistent wheezing and he is using home oxygen at 2 L as needed. Continues to complain of a poor appetite. Her weight 107 lbs, current Body mass index is 18.37 kg/m. Social History   Social History  . Marital status: Single    Spouse name: N/A  . Number of children: N/A  . Years of education: N/A   Occupational History  . Not on file.   Social History Main Topics  . Smoking status: Former Smoker    Packs/day: 1.00    Years: 40.00    Types: Cigarettes    Quit date: 09/06/2016  . Smokeless tobacco: Never Used  . Alcohol use Yes  . Drug use: Unknown  . Sexual activity: Not on file   Other Topics Concern  . Not on file   Social History Narrative  . No narrative on file    Family History  Problem Relation Age of Onset  . Depression Mother   . Stroke Father    Review of Systems See history of present illness Patient Active Problem List   Diagnosis Date Noted  . COPD exacerbation (HCC) 11/01/2016  . Acute respiratory failure with hypoxia (HCC) 09/19/2016  . COPD GOLD IV 09/19/2016  . Essential hypertension  09/19/2016  . Hypercholesterolemia 09/19/2016    Allergies  Allergen Reactions  . Norflex [Orphenadrine] Hives    Prior to Admission medications   Medication Sig Start Date End Date Taking? Authorizing Provider  albuterol (PROVENTIL HFA;VENTOLIN HFA) 108 (90 Base) MCG/ACT inhaler Inhale 2 puffs into the lungs every 6 (six) hours as needed for wheezing or shortness of breath. 10/04/16  Yes Bing NeighborsHarris, Shadi Larner S, FNP  albuterol (PROVENTIL) (2.5 MG/3ML) 0.083% nebulizer solution Take 3 mLs (2.5 mg total) by nebulization 4 (four) times daily. 10/03/16 01/31/17 Yes Bing NeighborsHarris, Reis Pienta S, FNP  feeding supplement (BOOST / RESOURCE BREEZE) LIQD Take 1 Container by mouth 3 (three) times daily between meals. 09/26/16  Yes Sheikh, Omair Latif, DO  hydrOXYzine (ATARAX/VISTARIL) 10 MG tablet Take 1 tablet (10 mg total) by mouth 3 (three) times daily as needed for anxiety. 10/03/16  Yes Bing NeighborsHarris, Alice Burnside S, FNP  ipratropium (ATROVENT) 0.02 % nebulizer solution Take 2.5 mLs (0.5 mg total) by nebulization 4 (four) times daily. 10/03/16 01/31/17 Yes Bing NeighborsHarris, Kain Milosevic S, FNP  mupirocin cream (BACTROBAN) 2 % Apply topically 2 (two) times daily. 11/03/16  Yes Emokpae, Ejiroghene E, MD  nicotine (NICODERM CQ - DOSED IN MG/24 HOURS) 14 mg/24hr patch Place 1 patch (14 mg total) onto the skin daily. 09/26/16  Yes Sheikh, Omair Latif, DO  OXYGEN 2 lpm  with sleep and occ during the day   Yes [provider]  pantoprazole (PROTONIX) 40 MG tablet Take 40 mg by mouth daily.   Yes [provider]  Tiotropium Bromide-Olodaterol (STIOLTO RESPIMAT) 2.5-2.5 MCG/ACT AERS Inhale 2 puffs into the lungs daily. 10/06/16  Yes Nyoka Cowden, MD  amLODipine (NORVASC) 10 MG tablet Take 10 mg by mouth daily.    [provider]  azithromycin (ZITHROMAX) 250 MG tablet Once daily. Patient not taking: Reported on 11/11/2016 11/04/16   Onnie Boer, MD  irbesartan (AVAPRO) 75 MG tablet Take 0.5 tablets (37.5 mg total) by mouth  daily. Patient not taking: Reported on 11/11/2016 10/03/16   Bing Neighbors, FNP  morphine (MSIR) 15 MG tablet Take 0.5 tablets (7.5 mg total) by mouth every 4 (four) hours as needed (shortness of breath). Patient not taking: Reported on 11/01/2016 09/26/16   Marguerita Merles Latif, DO  polyethylene glycol [facility] / GLYCOLAX) packet Take 17 g by mouth daily. Patient not taking: Reported on 11/01/2016 09/26/16   Marguerita Merles Latif, DO  senna-docusate (SENOKOT-S) 8.6-50 MG tablet Take 1 tablet by mouth 2 (two) times daily. Patient not taking: Reported on 11/01/2016 09/26/16   Merlene Laughter, DO    Past Medical, Surgical Family and Social History reviewed and updated.    Objective:   Today's Vitals   11/11/16 1010  BP: 110/84  Pulse: 96  Resp: 16  Temp: 98.8 F (37.1 C)  TempSrc: Oral  SpO2: 100%  Weight: 107 lb (48.5 kg)  Height: 5\' 4"  (1.626 m)    Wt Readings from Last 3 Encounters:  11/11/16 107 lb (48.5 kg)  11/01/16 104 lb (47.2 kg)  10/06/16 104 lb (47.2 kg)   Physical Exam  Constitutional: He is oriented to person, place, and time. He appears well-developed and well-nourished.  HENT:  Head: Normocephalic and atraumatic.  Cardiovascular: Normal rate, regular rhythm, normal heart sounds and intact distal pulses.   Pulmonary/Chest: Effort normal. He has wheezes.  Neurological: He is alert and oriented to person, place, and time.  Skin: Skin is warm and dry.  Psychiatric: He has a normal mood and affect. His behavior is normal. Judgment and thought content normal.   Assessment & Plan:  1. Chronic obstructive pulmonary disease with acute exacerbation (HCC) - methylPREDNISolone sodium succinate (SOLU-MEDROL) 125 mg/2 mL injection 62.5 mg; Inject 1 mL (62.5 mg total) into the muscle once. -Extending prednisone 20 mg daily x 30 days. We will trial a low dose of prednisone to reduce the frequency of exacerbations. Patient's condition mmediately improved with prednisone and  deteriorates the subsequent days following completion of prednisone. We will trial on a low dose prednisone for 30 days. Patient has a follow-up scheduled with pulmonology for further evaluation of persistent symptoms of wheezing and shortness of breath. -Continue nebulizer treatments every 6 hours around-the-clock. -Continue maintenance inhaler daily as prescribed. -Continue short acting inhaler every 4-6 hours as needed for shortness of breath.   Return for care in 3 weeks for follow-up of shortness of breath and wheezing.  Godfrey Pick. Tiburcio Pea, MSN, FNP-C [facility]  [address] [place] [place]oon [place], [place] [address] [phone]

## 2016-11-14 MED FILL — IRBESARTAN 75 MG TABLET: 75 | 30 days supply | Qty: 15 | Fill #1

## 2016-11-17 ENCOUNTER — Ambulatory Visit (INDEPENDENT_AMBULATORY_CARE_PROVIDER_SITE_OTHER): Payer: Medicaid Other | Admitting: Internal Medicine

## 2016-11-17 ENCOUNTER — Encounter: Payer: Self-pay | Admitting: Internal Medicine

## 2016-11-17 VITALS — BP 136/84 | HR 108 | Ht 64.0 in | Wt 107.0 lb

## 2016-11-17 DIAGNOSIS — J449 Chronic obstructive pulmonary disease, unspecified: Secondary | ICD-10-CM

## 2016-11-17 DIAGNOSIS — J9601 Acute respiratory failure with hypoxia: Secondary | ICD-10-CM | POA: Diagnosis not present

## 2016-11-17 MED ORDER — OMEPRAZOLE 40 MG PO CPDR: 40.0000 mg | DELAYED_RELEASE_CAPSULE | Freq: Every day | ORAL | 11 refills | Status: DC

## 2016-11-17 MED ORDER — FAMOTIDINE 20 MG PO TABS: ORAL_TABLET | ORAL | 11 refills | Status: DC

## 2016-11-17 MED ORDER — TIOTROPIUM BROMIDE-OLODATEROL 2.5-2.5 MCG/ACT IN AERS: 2.0000 | INHALATION_SPRAY | Freq: Every day | RESPIRATORY_TRACT | 0 refills | Status: DC

## 2016-11-17 MED FILL — FAMOTIDINE 20 MG TABLET: 20 | 30 days supply | Qty: 30 | Fill #0

## 2016-11-17 MED FILL — OMEPRAZOLE 40 MG CPDR: 40 | 30 days supply | Qty: 30 | Fill #0

## 2016-11-17 NOTE — Patient Instructions (Addendum)
Plan A = Automatic = stiolto 2 pffs each am and omeprazole 40 mg Take 30-60 min before first meal of the day and pepcid 20 mg at bedtime  Work on inhaler technique:  relax and gently blow all the way out then take a nice smooth deep breath back in, triggering the inhaler at same time you start breathing in.  Hold for up to 5 seconds if you can. Blow out thru nose. Rinse and gargle with water when done      Plan B = Backup Only use your albuterol (PROAIR)  as a rescue medication to be used if you can't catch your breath by resting or doing a relaxed purse lip breathing pattern.  - The less you use it, the better it will work when you need it. - Ok to use the inhaler up to 2 puffs  every 4 hours if you must but call for appointment if use goes up over your usual need - Don't leave home without it !!  (think of it like the spare tire for your car)   Plan C = Crisis - only use your albuterol/ipatropium  nebulizer if you first try Plan B and it fails to help > ok to use the nebulizer up to every 4 hours but if start needing it regularly call for immediate appointment   Please see patient coordinator before you leave today  to schedule pulmonary rehab today    Keep prednisone at 20 mg daily until seen again    Please schedule a follow up office visit in 6 weeks, call sooner if needed with all medications /inhalers/ solutions in hand so we can verify exactly what you are taking. This includes all medications from all doctors and over the counters

## 2016-11-17 NOTE — Progress Notes (Signed)
Subjective:     Patient ID: Matthew Morrison, male   DOB: 03-13-1956,     MRN: [id number]  HPI  [id number] yobm quit smoking 09/2016 s/p incarceration until March 2018 with basline =  MMRC3 = can't walk 100 yards even at a slow pace at a flat grade s stopping due to sob (since around 2016)  Then ran out of duoneb/saba and 1-2 weeks later much worse> admit   Admit date: 09/19/2016 Discharge date: 09/26/2016    Brief/Interim Summary: Matthew Morrison a 61 y.o.malewith a past medical history of Emphysema, Hypertension, Arthritis, HLD and other comorbids who moved to [place] in [place]. He has been out of his medications for a few months. He's had 2 visits to the emergency department for shortness of breath over the last 1 month. He was in his usual state of health about a couple days ago when he started developing shortness of breath with wheezing. Has had a cough. It has been dry. Denies any fever. No nausea, vomiting. Has had some chest tightness, but mainly with coughing. Symptoms are not getting better despite treatment at home with nebulizer treatments and so he decided to come into the hospital. In the emergency department, patient was given a breathing treatments and admitted for Hypoxia that was refractory to the Steroid Neb Treatments given in the ED. He was slowly improving and still feels SOB so a CT Chest Angio was ordered to r/o PE as patient was still tachycardic. CT showed noPE. Patient was slowly improving so Pulmonary was consulted for further management and evaluation for additional recommendations and they adjusted some of his medications. Palliative Care consulted for Symptom management at the recommendation of Pulmonary for his Severe/End Stage Lung Disease. Palliative Care placed the patient on po Morphine for Dyspnea and it helped so Morphine was increased to 7.5 mg po q4hprn. Patient improved and Pulmonary gave the ok to D/C home and will have patient follow up with PCP and Pulmonary as  an outpatient.    Discharge Diagnoses:  Principal Problem:   COPD with acute exacerbation (HCC) Active Problems:   Acute respiratory failure with hypoxia (HCC)   Emphysema of lung (HCC)   Essential hypertension   Hypercholesterolemia   COPD exacerbation (HCC)  Acute Respiratory Failure with Hypoxia likely 2/2 to Severe COPD -Patient desaturated to 88% on Room Air while Ambulating  -C/w Supplemental O2 and Wean As tolerated -C/w Continuous Pulse Oximetry and Maintain O2 Saturations >92% -Repeat CXR as an outpatient  -Obtained CT Chest Angio to rule out Pulmonary Embolus and did not show any PE -Pulmonary Consulted and Appreciate Dr. Sood's/Rakel Junio's recommendations -Walk Screen qualified patient for O2; Will send on Home O2  COPD with Acute exacerbation [facility](HCC): -Patient continues to have shortness of breath when talking, moving and changing positions. He is on 2L of oxygen and saturation is in the mid 90's on rest and needs increased O2 and 3-4 Liters ambulating.  -Continue Supplemental O2, C/w Albuterol 2.5 mg Neb qid and Ipratropium 0.5 mg Neb qidand Albuterol 2.5 mg Neb q2hprn for wheezing, Levofloxacin 500 mg po Daily (Day 7/7) Discontinued Yesterday  -Changed Methylprednisolone 80 mg IV q12h to 40 mg IV q12h: Start Steroid Taper per Pulm Reccs: Take 40mg  po daily for 3 days, then take 30mg  po daily for 3 days, then take 20mg  po daily for two days, then take 10mg  po daily for 2 days -C/w Protonix 40 mg po Daily  -C/w Budesonide 0.25 mg Neb RT BID  at D/C; Arformoterol Neb 15 mcg D/C'd by Pulmonary -C/w Dextromethorphan-Guaifenesin 30-600 2 Tablet po BID by Pulmonary  -Added Flutter Valve -NaCl Hypertonic Nebs 4 mL Daily discontinued  -C/w Incentive Spirometry and added Hydroxazine 10 mg po TIDprn for anxiety associated with breathing  -Pulmonary Added Umeclidinium Bromide 1 puff IH Daily yesterday but D/C'd 09/24/16 by Dr. Sherene Sires because ACEi causing intolerance to DPI inhibitor's   -Checked Respiratory Virus Panel and was Negative and patient is no longer on Droplet Precautions -Chest CT Angio for PE was Negative for PE -Consulted Pulmonary for further Recc's and appreciated Recc's -Pulmonary stating patient may benefit from Palliative Care Assessment; Palliative Care Medicine consulted for symptom management -Palliative Care placed the patient on po Morphine 5 mg po q4hprn and increased it to 7.5 mg po q4hprn for WOB and Dyspnea  -C/w po Morphine at D/C -C/w PCP and with Pulmonary at D/C; Started Albuterol Inhaler at D/C  Emphysema of lung (HCC) -CXR indicated some changes in the right upper lobe and a CT was ordered.  -CT Results showed scarring of the right upper lobe and emphysema of the lungs. No nodules or pneumonia suspected.  -As above;  -Repeated the Chest CT and doing it with Contrast to r/o PE and showed No pulmonary Embolus or Acute Aortic Syndrome but did show Aortic Atherosclerosis and severe Emphysema -As Above -Follow up with Pulmonary Dr. Sherene Sires 10/06/16 at 1:30 pm   Urinary Urgency -For 1 week patient has been experiencing the need to urinate but voiding only a small amount. Denies other urinary symptoms.  -PSA was normal and Urinalysis Negative.  -Improved; Follow up with PCP as an outpatient   Changes in bowel habits concern for Constipation: -Patient has been experiencing the need to void but states that for about a week, "only water comes out" and when he looks in the toilet, the discharge appears similar to mucous. States he had 2 normal bowel movements in the last week. Suspect there may be some constipation.  -C/w Miralax 17 grams po Daily and Senna-Docusate 1 tab po BID now that patient will be on Morphine for Dyspnea  -Continue to Monitor as he had a normal bowel movement yesterday  Essential Hypertension: -Patient had run out of home BP meds as well, admission BP was 179/128, trending down and currently is 129/81 -C/w Amlodipine 10  mg po Daily -Pulmonary changed Patient's Home Lisinopril 10 mg po Daily to Irbesartan 37.5 mg po Daily permantly because of Cough and will continue Irbesartan at D/C -Per Pulmonary the only way to prove the symptoms are not from ACEi is to be off of ACEi for 6 weeks  -Establish with and follow up with PCP  Moderate Malnutrition in the Context of Chronic illness -Nutritionist Consulted and Recc's Appreciated -C/w Boost Breeze 1 Container po TID  Tobacco Abuse/Smoker -Smoking Cessation Counseling given -Nicotine 14 mg TD q24h script given        10/06/2016 1st [facility] office visit/ Matthew Morrison   Off acei since 09/24/16  Chief Complaint  Patient presents with  . Hospitalization Follow-up    Breathing has improved some, but not back at his normal baseline. He uses albuterol inhaler 3 x per wk on average and neb with albuterol and ipratropium 4 x daily scheduled. He has o2 that he uses with sleep and "a couple of times during the day".     doe= MMRC3 = can't walk 100 yards even at a slow pace at a flat grade s stopping  due to sob  Even on 02  rec Plan A = Automatic = stiolto 2 pffs each am Work on inhaler technique:   Plan B = Backup Only use your albuterol(ventolin) as a rescue medication Plan C = Crisis - only use your albuterol/ipatropium  nebulizer if you first try Plan B and it fails to help > ok to use the nebulizer up to every 4 hours but if start needing it regularly call for immediate appointment   11/17/2016  f/u ov/Matthew Morrison re:  GOLD IV/ maint on stiolto/pred 20 mg daily  Chief Complaint  Patient presents with  . Follow-up    pt has good and bad days- sob, chest tightness, sometimes prod cough.    doe = able to HT s stopping/ s leaning on cart and s stopping on 2lpm and no 02 at rest  stiolto maint /  Using proair once a day and neb twice a daily    No obvious day to day or daytime variability or assoc excess/ purulent sputum or mucus plugs or hemoptysis or cp or chest  tightness, subjective wheeze or overt sinus or hb symptoms. No unusual exp hx or h/o childhood pna/ asthma or knowledge of premature birth.  Sleeping ok without nocturnal  or early am exacerbation  of respiratory  c/o's or need for noct saba. Also denies any obvious fluctuation of symptoms with weather or environmental changes or other aggravating or alleviating factors except as outlined above   Current Medications, Allergies, Complete Past Medical History, Past Surgical History, Family History, and Social History were reviewed in [facility] electronic medical record.  ROS  The following are not active complaints unless bolded sore throat, dysphagia, dental problems, itching, sneezing,  nasal congestion or excess/ purulent secretions, ear ache,   fever, chills, sweats, unintended wt loss, classically pleuritic or exertional cp,  orthopnea pnd or leg swelling, presyncope, palpitations, abdominal pain, anorexia, nausea, vomiting, diarrhea  or change in bowel or bladder habits, change in stools or urine, dysuria,hematuria,  rash, arthralgias, visual complaints, headache, numbness, weakness or ataxia or problems with walking or coordination,  change in mood/affect or memory.               Objective:   Physical Exam    thin amb bm nad  Wt Readings from Last 3 Encounters:  10/06/16 104 lb (47.2 kg)  10/03/16 102 lb (46.3 kg)  09/19/16 109 lb 2 oz (49.5 kg)    Vital signs reviewed  - Note on arrival 02 sats  96% on RA     HEENT: nl dentition, turbinates bilaterally, and oropharynx. Nl external ear canals without cough reflex   NECK :  without JVD/Nodes/TM/ nl carotid upstrokes bilaterally   LUNGS: no acc muscle use,  Mod barrel chested,  pan exp wheeze bilaterally - better with plm / hyperresonant to percussion   CV:  RRR  no s3 or murmur or increase in P2, and no edema   ABD:  soft and nontender with nl inspiratory excursion in the supine position. No bruits or organomegaly  appreciated, bowel sounds nl  MS:  Nl gait/ ext warm without deformities, calf tenderness, cyanosis or clubbing No obvious joint restrictions   SKIN: warm and dry without lesions    NEURO:  alert, approp, nl sensorium with  no motor or cerebellar deficits apparent.      . I personally reviewed images and agree with radiology impression as follows:  CXR: 11/01/16 No evidence of acute cardiopulmonary disease. My  impression: severe hyperinflation bilaterally   Assessment:

## 2016-11-18 MED FILL — PROVENTIL HFA 108 (90 BASE): 108 (90 BAS | 25 days supply | Qty: 7 | Fill #1

## 2016-11-18 MED FILL — ALBUTEROL 0.083% INHAL SOLN: (2.5 MG/3ML | 30 days supply | Qty: 360 | Fill #1

## 2016-11-19 ENCOUNTER — Encounter (HOSPITAL_COMMUNITY): Payer: Self-pay | Admitting: Emergency Medicine

## 2016-11-19 ENCOUNTER — Emergency Department (HOSPITAL_COMMUNITY): Payer: Medicaid Other

## 2016-11-19 ENCOUNTER — Inpatient Hospital Stay (HOSPITAL_COMMUNITY)
Admission: EM | Admit: 2016-11-19 | Discharge: 2016-11-22 | DRG: 190 | Disposition: A | Discharge status: Home / Self Care | Payer: Medicaid Other | Attending: Internal Medicine | Admitting: Internal Medicine

## 2016-11-19 DIAGNOSIS — Z888 Allergy status to other drugs, medicaments and biological substances status: Secondary | ICD-10-CM

## 2016-11-19 DIAGNOSIS — J441 Chronic obstructive pulmonary disease with (acute) exacerbation: Principal | ICD-10-CM | POA: Diagnosis present

## 2016-11-19 DIAGNOSIS — Z681 Body mass index (BMI) 19 or less, adult: Secondary | ICD-10-CM

## 2016-11-19 DIAGNOSIS — Z79899 Other long term (current) drug therapy: Secondary | ICD-10-CM | POA: Diagnosis not present

## 2016-11-19 DIAGNOSIS — R0600 Dyspnea, unspecified: Secondary | ICD-10-CM | POA: Diagnosis not present

## 2016-11-19 DIAGNOSIS — Z9981 Dependence on supplemental oxygen: Secondary | ICD-10-CM | POA: Diagnosis not present

## 2016-11-19 DIAGNOSIS — J9601 Acute respiratory failure with hypoxia: Secondary | ICD-10-CM | POA: Diagnosis present

## 2016-11-19 DIAGNOSIS — E875 Hyperkalemia: Secondary | ICD-10-CM | POA: Diagnosis present

## 2016-11-19 DIAGNOSIS — Z7952 Long term (current) use of systemic steroids: Secondary | ICD-10-CM

## 2016-11-19 DIAGNOSIS — I10 Essential (primary) hypertension: Secondary | ICD-10-CM | POA: Diagnosis present

## 2016-11-19 DIAGNOSIS — Z87891 Personal history of nicotine dependence: Secondary | ICD-10-CM

## 2016-11-19 DIAGNOSIS — J449 Chronic obstructive pulmonary disease, unspecified: Secondary | ICD-10-CM | POA: Diagnosis present

## 2016-11-19 DIAGNOSIS — E43 Unspecified severe protein-calorie malnutrition: Secondary | ICD-10-CM | POA: Diagnosis present

## 2016-11-19 LAB — CBC WITH DIFFERENTIAL/PLATELET
BASOS ABS: 0 10*3/uL (ref 0.0–0.1)
Basophils Relative: 0 %
EOS ABS: 0 10*3/uL (ref 0.0–0.7)
Eosinophils Relative: 0 %
HCT: 38.4 % — ABNORMAL LOW (ref 39.0–52.0)
HEMOGLOBIN: 13 g/dL (ref 13.0–17.0)
LYMPHS ABS: 2.9 10*3/uL (ref 0.7–4.0)
Lymphocytes Relative: 35 %
MCH: 24.1 pg — AB (ref 26.0–34.0)
MCHC: 33.9 g/dL (ref 30.0–36.0)
MCV: 71.1 fL — ABNORMAL LOW (ref 78.0–100.0)
Monocytes Absolute: 0.6 10*3/uL (ref 0.1–1.0)
Monocytes Relative: 8 %
NEUTROS PCT: 57 %
Neutro Abs: 4.8 10*3/uL (ref 1.7–7.7)
Platelets: 262 10*3/uL (ref 150–400)
RBC: 5.4 MIL/uL (ref 4.22–5.81)
RDW: 15.4 % (ref 11.5–15.5)
WBC: 8.4 10*3/uL (ref 4.0–10.5)

## 2016-11-19 LAB — COMPREHENSIVE METABOLIC PANEL
ALT: 18 U/L (ref 17–63)
AST: 28 U/L (ref 15–41)
Albumin: 3.8 g/dL (ref 3.5–5.0)
Alkaline Phosphatase: 41 U/L (ref 38–126)
Anion gap: 9 (ref 5–15)
BUN: 7 mg/dL (ref 6–20)
CHLORIDE: 96 mmol/L — AB (ref 101–111)
CO2: 33 mmol/L — ABNORMAL HIGH (ref 22–32)
Calcium: 9.1 mg/dL (ref 8.9–10.3)
Creatinine, Ser: 0.63 mg/dL (ref 0.61–1.24)
GFR calc Af Amer: >60 mL/min (ref >60)
Glucose, Bld: 121 mg/dL — ABNORMAL HIGH (ref 65–99)
POTASSIUM: 5.3 mmol/L — AB (ref 3.5–5.1)
Sodium: 138 mmol/L (ref 135–145)
Total Bilirubin: 1 mg/dL (ref 0.3–1.2)
Total Protein: 7.3 g/dL (ref 6.5–8.1)

## 2016-11-19 LAB — I-STAT TROPONIN, ED: TROPONIN I, POC: 0 ng/mL (ref 0.00–0.08)

## 2016-11-19 LAB — BRAIN NATRIURETIC PEPTIDE: B NATRIURETIC PEPTIDE 5: 26.3 pg/mL (ref 0.0–100.0)

## 2016-11-19 MED ORDER — ALBUTEROL (5 MG/ML) CONTINUOUS INHALATION SOLN
10.0000 mg/h | INHALATION_SOLUTION | RESPIRATORY_TRACT | Status: DC
  Administered 2016-11-19: 10 mg/h via RESPIRATORY_TRACT
  Filled 2016-11-19: qty 20

## 2016-11-19 MED ORDER — DEXTROSE 5 % IV SOLN
500.0000 mg | Freq: Every day | INTRAVENOUS | Status: DC
  Administered 2016-11-20 – 2016-11-21 (×3): 500 mg via INTRAVENOUS
  Filled 2016-11-19 (×4): qty 500

## 2016-11-19 MED ORDER — ACETAMINOPHEN 325 MG PO TABS: 650.0000 mg | ORAL_TABLET | Freq: Four times a day (QID) | ORAL | Status: DC | PRN

## 2016-11-19 MED ORDER — ONDANSETRON HCL 4 MG PO TABS: 4.0000 mg | ORAL_TABLET | Freq: Four times a day (QID) | ORAL | Status: DC | PRN

## 2016-11-19 MED ORDER — PANTOPRAZOLE SODIUM 40 MG PO TBEC
40.0000 mg | DELAYED_RELEASE_TABLET | Freq: Every day | ORAL | Status: DC
  Administered 2016-11-20 – 2016-11-22 (×3): 40 mg via ORAL
  Filled 2016-11-19 (×3): qty 1

## 2016-11-19 MED ORDER — SODIUM CHLORIDE 0.9 % IV SOLN
250.0000 mL | INTRAVENOUS | Status: DC | PRN
Start: 2016-11-19 — End: 2016-11-22

## 2016-11-19 MED ORDER — IPRATROPIUM-ALBUTEROL 0.5-2.5 (3) MG/3ML IN SOLN
3.0000 mL | Freq: Four times a day (QID) | RESPIRATORY_TRACT | Status: DC
  Administered 2016-11-20 – 2016-11-22 (×10): 3 mL via RESPIRATORY_TRACT
  Filled 2016-11-19 (×10): qty 3

## 2016-11-19 MED ORDER — BISACODYL 5 MG PO TBEC
5.0000 mg | DELAYED_RELEASE_TABLET | Freq: Every day | ORAL | Status: DC | PRN
Start: 2016-11-19 — End: 2016-11-22

## 2016-11-19 MED ORDER — METHYLPREDNISOLONE SODIUM SUCC 125 MG IJ SOLR
60.0000 mg | Freq: Four times a day (QID) | INTRAMUSCULAR | Status: DC
  Administered 2016-11-20 – 2016-11-21 (×6): 60 mg via INTRAVENOUS
  Filled 2016-11-19 (×6): qty 2

## 2016-11-19 MED ORDER — SODIUM CHLORIDE 0.9% FLUSH
3.0000 mL | Freq: Two times a day (BID) | INTRAVENOUS | Status: DC
  Administered 2016-11-20 – 2016-11-21 (×2): 3 mL via INTRAVENOUS

## 2016-11-19 MED ORDER — HYDROCODONE-ACETAMINOPHEN 5-325 MG PO TABS: 1.0000 | ORAL_TABLET | ORAL | Status: DC | PRN

## 2016-11-19 MED ORDER — ONDANSETRON HCL 4 MG/2ML IJ SOLN: 4.0000 mg | Freq: Four times a day (QID) | INTRAMUSCULAR | Status: DC | PRN

## 2016-11-19 MED ORDER — HYDROXYZINE HCL 10 MG PO TABS
10.0000 mg | ORAL_TABLET | Freq: Three times a day (TID) | ORAL | Status: DC | PRN
  Administered 2016-11-21: 10 mg via ORAL
  Filled 2016-11-19: qty 1

## 2016-11-19 MED ORDER — SENNOSIDES-DOCUSATE SODIUM 8.6-50 MG PO TABS: 1.0000 | ORAL_TABLET | Freq: Every evening | ORAL | Status: DC | PRN

## 2016-11-19 MED ORDER — IPRATROPIUM BROMIDE 0.02 % IN SOLN: 0.5000 mg | Freq: Four times a day (QID) | RESPIRATORY_TRACT | Status: DC

## 2016-11-19 MED ORDER — FAMOTIDINE 20 MG PO TABS
20.0000 mg | ORAL_TABLET | Freq: Every day | ORAL | Status: DC
  Administered 2016-11-20 – 2016-11-21 (×3): 20 mg via ORAL
  Filled 2016-11-19 (×3): qty 1

## 2016-11-19 MED ORDER — GUAIFENESIN-DM 100-10 MG/5ML PO SYRP
5.0000 mL | ORAL_SOLUTION | ORAL | Status: DC | PRN
  Administered 2016-11-20 – 2016-11-21 (×2): 5 mL via ORAL
  Filled 2016-11-19 (×2): qty 10

## 2016-11-19 MED ORDER — ACETAMINOPHEN 650 MG RE SUPP: 650.0000 mg | Freq: Four times a day (QID) | RECTAL | Status: DC | PRN

## 2016-11-19 MED ORDER — SODIUM CHLORIDE 0.9% FLUSH
3.0000 mL | Freq: Two times a day (BID) | INTRAVENOUS | Status: DC
  Administered 2016-11-21: 3 mL via INTRAVENOUS

## 2016-11-19 MED ORDER — ALBUTEROL SULFATE (2.5 MG/3ML) 0.083% IN NEBU: 2.5000 mg | INHALATION_SOLUTION | RESPIRATORY_TRACT | Status: DC | PRN

## 2016-11-19 MED ORDER — ALBUTEROL (5 MG/ML) CONTINUOUS INHALATION SOLN
INHALATION_SOLUTION | RESPIRATORY_TRACT | Status: AC
  Filled 2016-11-19: qty 20

## 2016-11-19 MED ORDER — METHYLPREDNISOLONE SODIUM SUCC 125 MG IJ SOLR
125.0000 mg | Freq: Once | INTRAMUSCULAR | Status: AC
  Administered 2016-11-19: 125 mg via INTRAVENOUS
  Filled 2016-11-19: qty 2

## 2016-11-19 MED ORDER — BOOST / RESOURCE BREEZE PO LIQD
1.0000 | Freq: Three times a day (TID) | ORAL | Status: DC
  Administered 2016-11-20 – 2016-11-22 (×6): 1 via ORAL

## 2016-11-19 MED ORDER — SODIUM CHLORIDE 0.9% FLUSH: 3.0000 mL | INTRAVENOUS | Status: DC | PRN

## 2016-11-19 MED ORDER — ENOXAPARIN SODIUM 40 MG/0.4ML ~~LOC~~ SOLN
40.0000 mg | Freq: Every day | SUBCUTANEOUS | Status: DC
  Administered 2016-11-20 – 2016-11-21 (×3): 40 mg via SUBCUTANEOUS
  Filled 2016-11-19 (×3): qty 0.4

## 2016-11-19 NOTE — ED Provider Notes (Signed)
[facility]-EMERGENCY DEPT Provider Note   CSN: [id number] Arrival date & time: 11/19/16  2010     History   Chief Complaint Chief Complaint  Patient presents with  . Shortness of Breath    HPI Matthew Morrison is a 61 y.o. male.  Patient is a 61 year old male with past medical history of COPD, hypertension presenting with complaints of shortness of breath. This has worsened over the past several days. He denies any fevers or chills, but does report nonproductive cough. He denies any chest pain or leg swelling. He has been using his home nebulizers with minimal relief. He was admitted here to the hospital approximately 2 weeks ago with a flareup of his COPD.   The history is provided by the patient.  Shortness of Breath  This is a recurrent problem. The average episode lasts 2 days. The problem occurs continuously.The problem has been rapidly worsening. Associated symptoms include cough. Pertinent negatives include no fever, no sputum production, no chest pain, no leg pain and no leg swelling. Treatments tried: nebulizer. The treatment provided no relief. Associated medical issues include COPD.    Past Medical History:  Diagnosis Date  . Asthma   . Emphysema lung (HCC)   . Hypertension     Patient Active Problem List   Diagnosis Date Noted  . COPD exacerbation (HCC) 11/01/2016  . Acute respiratory failure with hypoxia (HCC) 09/19/2016  . COPD GOLD IV 09/19/2016  . Essential hypertension 09/19/2016  . Hypercholesterolemia 09/19/2016    Past Surgical History:  Procedure Laterality Date  . HERNIA REPAIR         Home Medications    Prior to Admission medications   Medication Sig Start Date End Date Taking? Authorizing Provider  albuterol (PROVENTIL HFA;VENTOLIN HFA) 108 (90 Base) MCG/ACT inhaler Inhale 2 puffs into the lungs every 4 (four) hours as needed for wheezing or shortness of breath. 11/11/16   Bing Neighbors, FNP  albuterol (PROVENTIL) (2.5 MG/3ML) 0.083% nebulizer  solution Take 3 mLs (2.5 mg total) by nebulization 4 (four) times daily. 10/03/16 01/31/17  Bing Neighbors, FNP  famotidine (PEPCID) 20 MG tablet One at bedtime 11/17/16   Nyoka Cowden, MD  feeding supplement (BOOST / RESOURCE BREEZE) LIQD Take 1 Container by mouth 3 (three) times daily between meals. 09/26/16   Marguerita Merles Latif, DO  hydrOXYzine (ATARAX/VISTARIL) 10 MG tablet Take 1 tablet (10 mg total) by mouth 3 (three) times daily as needed for anxiety. 10/03/16   Bing Neighbors, FNP  ipratropium (ATROVENT) 0.02 % nebulizer solution Take 2.5 mLs (0.5 mg total) by nebulization 4 (four) times daily. 10/03/16 01/31/17  Bing Neighbors, FNP  irbesartan (AVAPRO) 75 MG tablet Take 0.5 tablets (37.5 mg total) by mouth daily. 10/03/16   Bing Neighbors, FNP  mupirocin cream (BACTROBAN) 2 % Apply topically 2 (two) times daily. 11/03/16   Emokpae, Ejiroghene E, MD  omeprazole (PRILOSEC) 40 MG capsule Take 1 capsule (40 mg total) by mouth daily. 11/17/16   Nyoka Cowden, MD  OXYGEN 2 lpm with sleep and occ during the day    [provider]  predniSONE (DELTASONE) 20 MG tablet Take 1 tablet (20 mg total) by mouth daily with breakfast. 11/11/16   Bing Neighbors, FNP  Tiotropium Bromide-Olodaterol (STIOLTO RESPIMAT) 2.5-2.5 MCG/ACT AERS Inhale 2 puffs into the lungs daily. 10/06/16   Nyoka Cowden, MD    Family History Family History  Problem Relation Age of Onset  . Depression Mother   .  Stroke Father     Social History Social History  Substance Use Topics  . Smoking status: Former Smoker    Packs/day: 1.00    Years: 40.00    Types: Cigarettes    Quit date: 09/06/2016  . Smokeless tobacco: Never Used  . Alcohol use Yes     Allergies   Norflex [orphenadrine]   Review of Systems Review of Systems  Constitutional: Negative for fever.  Respiratory: Positive for cough and shortness of breath. Negative for sputum production.   Cardiovascular: Negative for chest pain and  leg swelling.  All other systems reviewed and are negative.    Physical Exam Updated Vital Signs BP (!) 170/116 (BP Location: Right Arm)   Pulse (!) 121   Temp 97.9 F (36.6 C) (Oral)   Resp (!) 24   SpO2 100%   Physical Exam  Constitutional: He is oriented to person, place, and time. He appears well-developed and well-nourished. No distress.  Patient is somewhat cachectic and in mild respiratory distress.  HENT:  Head: Normocephalic and atraumatic.  Mouth/Throat: Oropharynx is clear and moist.  Neck: Normal range of motion. Neck supple.  Cardiovascular: Normal rate and regular rhythm.  Exam reveals no friction rub.   No murmur heard. Pulmonary/Chest: Breath sounds normal. He is in respiratory distress. He has no wheezes. He has no rales.  Patient in mild respiratory distress. He is protecting his airway, but does have difficulty finishing sentences.  Abdominal: Soft. Bowel sounds are normal. He exhibits no distension. There is no tenderness.  Musculoskeletal: Normal range of motion. He exhibits no edema.  There is no calf tenderness. Homans sign is absent bilaterally.  Neurological: He is alert and oriented to person, place, and time. Coordination normal.  Skin: Skin is warm and dry. He is not diaphoretic.  Nursing note and vitals reviewed.    ED Treatments / Results  Labs (all labs ordered are listed, but only abnormal results are displayed) Labs Reviewed  CBC WITH DIFFERENTIAL/PLATELET  BRAIN NATRIURETIC PEPTIDE  COMPREHENSIVE METABOLIC PANEL  I-STAT TROPONIN, ED    EKG  EKG Interpretation None       Radiology No results found.  Procedures Procedures (including critical care time)  Medications Ordered in ED Medications  methylPREDNISolone sodium succinate (SOLU-MEDROL) 125 mg/2 mL injection 125 mg (not administered)     Initial Impression / Assessment and Plan / ED Course  I have reviewed the triage vital signs and the nursing notes.  Pertinent  labs & imaging results that were available during my care of the patient were reviewed by me and considered in my medical decision making (see chart for details).  Patient with history of COPD presenting with shortness of breath and wheezing. He was in mild respiratory distress upon presentation, however responded nicely to repeat nebulizer treatments, steroids. He remains somewhat hypoxic however. He will be admitted to the hospitalist service for additional treatment.  CRITICAL CARE Performed by: Geoffery Lyons Total critical care time: 30 minutes Critical care time was exclusive of separately billable procedures and treating other patients. Critical care was necessary to treat or prevent imminent or life-threatening deterioration. Critical care was time spent personally by me on the following activities: development of treatment plan with patient and/or surrogate as well as nursing, discussions with consultants, evaluation of patient's response to treatment, examination of patient, obtaining history from patient or surrogate, ordering and performing treatments and interventions, ordering and review of laboratory studies, ordering and review of radiographic studies, pulse oximetry and re-evaluation  of patient's condition.   Final Clinical Impressions(s) / ED Diagnoses   Final diagnoses:  None    New Prescriptions New Prescriptions   No medications on file     Geoffery Lyons, MD 11/19/16 2316

## 2016-11-19 NOTE — ED Notes (Signed)
Bed: HE03 Expected date:  Expected time:  Means of arrival:  Comments: Held for resus a

## 2016-11-19 NOTE — ED Notes (Signed)
Bed: RESA Expected date:  Expected time:  Means of arrival:  Comments: 

## 2016-11-19 NOTE — ED Triage Notes (Signed)
Pt arriving via EMS from home with complaints of shortness of breath. Pt has hx of COPD which he reports has been worsening over the last few days. Pt states he was unable to fill his prescription for albuterol and was given a different medication which has not been helping. Pt received 3 Albuterol treatments, 1mg  of Atrovent, 125mg  of Solumedrol, and 2g Mag prior to arrival. Pt A&O x 4.

## 2016-11-19 NOTE — H&P (Signed)
History and Physical    Matthew Morrison XBJ:[id number] DOB: 1955-06-26 DOA: 11/19/2016  PCP: Bing Neighbors, FNP   Patient coming from: Home  Chief Complaint: Dyspnea, wheezing   HPI: Matthew Morrison is a 61 y.o. male with medical history significant for emphysema and hypertension, and into the emergency department for evaluation of worsening dyspnea and wheezing. Patient reports that he is been on 20 mg of prednisone daily for the past couple weeks and had been fairly stable until this morning when he began to experience worsening in his chronic dyspnea. Despite his home inhalers and nebulizers, he continued to worsen over the course of the day and now presents with severe dyspnea at rest. He denies any significant cough, noting that he feels as though he has some phlegm he needs to expectorate, but is unable to. Denies chest pain or palpitations. Denies fevers or chills. No lower extremity swelling or tenderness, and no orthopnea.   ED Course: Upon arrival to the ED, patient is found to be afebrile, saturating adequately on 8 L/m supplemental oxygen, tachypneic, mildly tachycardic, and with stable blood pressure. Chest x-ray is notable for changes consistent with COPD, but no acute findings. Chemistry panel reveals a potassium of 5.3 and bicarbonate of 33. CBC is notable for microcytosis to 71.1. Troponin is undetectable and BNP is normal. Patient was treated with 125 mg of IV Solu-Medrol and 10 mg continuous albuterol treatment. He had some improvement in the ED, but continues to be significantly dyspneic while at rest. He'll be admitted to the telemetry unit for ongoing evaluation and management of COPD with acute exacerbation.  Review of Systems:  All other systems reviewed and apart from HPI, are negative.  Past Medical History:  Diagnosis Date  . Asthma   . Emphysema lung (HCC)   . Hypertension     Past Surgical History:  Procedure Laterality Date  . HERNIA REPAIR       reports that he  quit smoking about 2 months ago. His smoking use included Cigarettes. He has a 40.00 pack-year smoking history. He has never used smokeless tobacco. He reports that he drinks alcohol. His drug history is not on file.  Allergies  Allergen Reactions  . Norflex [Orphenadrine] Hives    Family History  Problem Relation Age of Onset  . Depression Mother   . Stroke Father      Prior to Admission medications   Medication Sig Start Date End Date Taking? Authorizing Provider  albuterol (PROVENTIL HFA;VENTOLIN HFA) 108 (90 Base) MCG/ACT inhaler Inhale 2 puffs into the lungs every 4 (four) hours as needed for wheezing or shortness of breath. 11/11/16   Bing Neighbors, FNP  albuterol (PROVENTIL) (2.5 MG/3ML) 0.083% nebulizer solution Take 3 mLs (2.5 mg total) by nebulization 4 (four) times daily. 10/03/16 01/31/17  Bing Neighbors, FNP  famotidine (PEPCID) 20 MG tablet One at bedtime 11/17/16   Nyoka Cowden, MD  feeding supplement (BOOST / RESOURCE BREEZE) LIQD Take 1 Container by mouth 3 (three) times daily between meals. 09/26/16   Marguerita Merles Latif, DO  hydrOXYzine (ATARAX/VISTARIL) 10 MG tablet Take 1 tablet (10 mg total) by mouth 3 (three) times daily as needed for anxiety. 10/03/16   Bing Neighbors, FNP  ipratropium (ATROVENT) 0.02 % nebulizer solution Take 2.5 mLs (0.5 mg total) by nebulization 4 (four) times daily. 10/03/16 01/31/17  Bing Neighbors, FNP  irbesartan (AVAPRO) 75 MG tablet Take 0.5 tablets (37.5 mg total) by mouth daily. 10/03/16  Bing Neighbors, FNP  mupirocin cream (BACTROBAN) 2 % Apply topically 2 (two) times daily. 11/03/16   Emokpae, Ejiroghene E, MD  omeprazole (PRILOSEC) 40 MG capsule Take 1 capsule (40 mg total) by mouth daily. 11/17/16   Nyoka Cowden, MD  OXYGEN 2 lpm with sleep and occ during the day    [provider]  predniSONE (DELTASONE) 20 MG tablet Take 1 tablet (20 mg total) by mouth daily with breakfast. 11/11/16   Bing Neighbors, FNP    Tiotropium Bromide-Olodaterol (STIOLTO RESPIMAT) 2.5-2.5 MCG/ACT AERS Inhale 2 puffs into the lungs daily. 10/06/16   Nyoka Cowden, MD    Physical Exam: Vitals:   11/19/16 2024 11/19/16 2045 11/19/16 2124 11/19/16 2130  BP: (!) 170/116  (!) 125/95 119/84  Pulse: (!) 121  (!) 108 (!) 113  Resp: (!) 24   16  Temp: 97.9 F (36.6 C)     TempSrc: Oral     SpO2: 100% 100% 100% 100%      Constitutional: Tachypneic, dyspneic with speech, no pallor or cyanosis Eyes: PERTLA, lids and conjunctivae normal ENMT: Mucous membranes are moist. Posterior pharynx clear of any exudate or lesions.   Neck: normal, supple, no masses, no thyromegaly Respiratory: Markedly diminished bilaterally with prolonged expiratory phase and end-expiratory wheeze. No accessory muscle use.  Cardiovascular: Rate ~110 and regular. No extremity edema. No significant JVD. Abdomen: No distension, no tenderness, no masses palpated. Bowel sounds normal.  Musculoskeletal: no clubbing / cyanosis. No joint deformity upper and lower extremities.   Skin: no significant rashes, lesions, ulcers. Warm, dry, well-perfused. Neurologic: CN 2-12 grossly intact. Sensation intact, DTR normal. Strength 5/5 in all 4 limbs.  Psychiatric: Alert and oriented x 3. Calm, cooperative.     Labs on Admission: I have personally reviewed following labs and imaging studies  CBC:  Recent Labs Lab 11/19/16 2030  WBC 8.4  NEUTROABS 4.8  HGB 13.0  HCT 38.4*  MCV 71.1*  PLT 262   Basic Metabolic Panel:  Recent Labs Lab 11/19/16 2030  NA 138  K 5.3*  CL 96*  CO2 33*  GLUCOSE 121*  BUN 7  CREATININE 0.63  CALCIUM 9.1   GFR: Estimated Creatinine Clearance: 69 mL/min (by C-G formula based on SCr of 0.63 mg/dL). Liver Function Tests:  Recent Labs Lab 11/19/16 2030  AST 28  ALT 18  ALKPHOS 41  BILITOT 1.0  PROT 7.3  ALBUMIN 3.8   No results for input(s): LIPASE, AMYLASE in the last 168 hours. No results for input(s):  AMMONIA in the last 168 hours. Coagulation Profile: No results for input(s): INR, PROTIME in the last 168 hours. Cardiac Enzymes: No results for input(s): CKTOTAL, CKMB, CKMBINDEX, TROPONINI in the last 168 hours. BNP (last 3 results) No results for input(s): PROBNP in the last 8760 hours. HbA1C: No results for input(s): HGBA1C in the last 72 hours. CBG: No results for input(s): GLUCAP in the last 168 hours. Lipid Profile: No results for input(s): CHOL, HDL, LDLCALC, TRIG, CHOLHDL, LDLDIRECT in the last 72 hours. Thyroid Function Tests: No results for input(s): TSH, T4TOTAL, FREET4, T3FREE, THYROIDAB in the last 72 hours. Anemia Panel: No results for input(s): VITAMINB12, FOLATE, FERRITIN, TIBC, IRON, RETICCTPCT in the last 72 hours. Urine analysis:    Component Value Date/Time   COLORURINE STRAW (A) 09/20/2016 1216   APPEARANCEUR CLEAR 09/20/2016 1216   LABSPEC 1.025 10/03/2016 1002   PHURINE 7.0 10/03/2016 1002   GLUCOSEU NEGATIVE 10/03/2016 1002  HGBUR NEGATIVE 10/03/2016 1002   BILIRUBINUR NEGATIVE 10/03/2016 1002   KETONESUR NEGATIVE 10/03/2016 1002   PROTEINUR 30 (A) 10/03/2016 1002   UROBILINOGEN 1.0 10/03/2016 1002   NITRITE NEGATIVE 10/03/2016 1002   LEUKOCYTESUR NEGATIVE 10/03/2016 1002   Sepsis Labs: @LABRCNTIP (procalcitonin:4,lacticidven:4) )No results found for this or any previous visit (from the past 240 hour(s)).   Radiological Exams on Admission: Dg Chest 2 View  Result Date: 11/19/2016 CLINICAL DATA:  Acute onset of shortness of breath. Initial encounter. EXAM: CHEST  2 VIEW COMPARISON:  Chest radiograph performed 11/01/2016 FINDINGS: The lungs are hyperexpanded, with flattening of the hemidiaphragms, compatible with COPD. There is no evidence of focal opacification, pleural effusion or pneumothorax. The heart is normal in size; the mediastinal contour is within normal limits. No acute osseous abnormalities are seen. IMPRESSION: Findings of COPD.  No acute  cardiopulmonary process seen. Electronically Signed   By: Roanna Raider M.D.   On: 11/19/2016 21:11    EKG: Not performed.   Assessment/Plan  1. COPD with acute exacerbation  - Pt presents with acute worsening in his chronic dyspnea, presents in distress while at rest despite home treatments  - He has been using nebs at home and taking prednisone 20 mg qD  - No fever or leukocytosis, CXR grossly clear, no suggestion of vol o/l or VTE  - Treated by EMS with 2 g mag, nebs, steroids  - Treated in ED with 125 mg IV Solu-Medrol and 10 mg albuterol neb  - Continues to be dyspneic at rest in ED despite some improvement - Plan to continue Solu-Medrol with 60 mg IV q6h, nebs, supplemental O2   2. Hypertension  - DBP is elevated in ED  - Treated at home with irbesartan  - Hold ARB given slight hyperkalemia - Treat with hydralazine IVP's prn for now   3. Hyperkalemia  - Serum potassium is slightly elevated to 5.3 on admission despite continuous albuterol treatements - Renal fxn appears preserved, not taking potassium supplements  - Possibly secondary to ARB, will hold  - Continue cardiac monitoring and albuterol nebs, repeat chemistries in am   DVT prophylaxis: sq Lovenox Code Status: Full  Family Communication: Discussed with patient Disposition Plan: Admit to telemetry Consults called: None Admission status: Inpatient    Briscoe Deutscher, MD [facility] Pager [phone]  If 7PM-7AM, please contact night-coverage [email or web address] Password [facility]  11/19/2016, 10:41 PM

## 2016-11-19 NOTE — ED Notes (Signed)
Please call nurse for report at [phone]. Number [phone]

## 2016-11-20 ENCOUNTER — Encounter (HOSPITAL_COMMUNITY): Payer: Self-pay

## 2016-11-20 DIAGNOSIS — J441 Chronic obstructive pulmonary disease with (acute) exacerbation: Principal | ICD-10-CM

## 2016-11-20 LAB — BASIC METABOLIC PANEL
Anion gap: 7 (ref 5–15)
BUN: 15 mg/dL (ref 6–20)
CHLORIDE: 96 mmol/L — AB (ref 101–111)
CO2: 32 mmol/L (ref 22–32)
Calcium: 9.2 mg/dL (ref 8.9–10.3)
Creatinine, Ser: 0.71 mg/dL (ref 0.61–1.24)
GFR calc Af Amer: >60 mL/min (ref >60)
GFR calc non Af Amer: >60 mL/min (ref >60)
Glucose, Bld: 214 mg/dL — ABNORMAL HIGH (ref 65–99)
POTASSIUM: 4.1 mmol/L (ref 3.5–5.1)
SODIUM: 135 mmol/L (ref 135–145)

## 2016-11-20 LAB — EXPECTORATED SPUTUM ASSESSMENT W REFEX TO RESP CULTURE

## 2016-11-20 LAB — EXPECTORATED SPUTUM ASSESSMENT W GRAM STAIN, RFLX TO RESP C

## 2016-11-20 LAB — GLUCOSE, CAPILLARY: GLUCOSE-CAPILLARY: 148 mg/dL — AB (ref 65–99)

## 2016-11-20 MED ORDER — BUDESONIDE 0.25 MG/2ML IN SUSP
0.2500 mg | Freq: Two times a day (BID) | RESPIRATORY_TRACT | Status: DC | PRN
  Filled 2016-11-20: qty 2

## 2016-11-20 MED ORDER — ORAL CARE MOUTH RINSE
15.0000 mL | Freq: Two times a day (BID) | OROMUCOSAL | Status: DC
Start: 2016-11-20 — End: 2016-11-22
  Administered 2016-11-20 – 2016-11-22 (×6): 15 mL via OROMUCOSAL

## 2016-11-20 MED ORDER — HYDRALAZINE HCL 20 MG/ML IJ SOLN
5.0000 mg | INTRAMUSCULAR | Status: DC | PRN
  Filled 2016-11-20: qty 0.25

## 2016-11-20 NOTE — Plan of Care (Signed)
Problem: Activity: Goal: Risk for activity intolerance will decrease Outcome: Progressing Air movement has increased with breathing Tx.

## 2016-11-20 NOTE — Evaluation (Signed)
Physical Therapy Evaluation Patient Details Name: Matthew Morrison MRN: [id number] DOB: 08-04-1955 Today's Date: 11/20/2016   History of Present Illness  60 y/o admitted with acute exacerbation of COPD PMH: COPD, HTN  Clinical Impression  Pt admitted with above diagnosis. Pt currently with functional limitations due to the deficits listed below (see PT Problem List).  Pt will benefit from skilled PT to increase their independence and safety with mobility to allow discharge to the venue listed below.  Pt is motivated but fatigues easily; will follow but likely will not need more than 1-2 more sessions of PT, recommend pt amb as tol with nursing staff also; pt would like to return to amb without being reliant on RW; O2 sats 95-100% on RA ; no f/u post acute     Follow Up Recommendations No PT follow up    Equipment Recommendations  None recommended by PT    Recommendations for Other Services       Precautions / Restrictions Precautions Precautions: Fall      Mobility  Bed Mobility Overal bed mobility: Modified Independent                Transfers Overall transfer level: Needs assistance   Transfers: Sit to/from Stand Sit to Stand: Supervision;Modified independent (Device/Increase time)            Ambulation/Gait Ambulation/Gait assistance: Min guard;Supervision Ambulation Distance (Feet): 80 Feet Assistive device: Rolling walker (2 wheeled) Gait Pattern/deviations: Step-through pattern;Trunk flexed     General Gait Details: cues for trunk extension, breathing, RW position; pt heavily reliant on RW and fatigues easily; O2 sats 100-95% on 2L throughout  Stairs            Wheelchair Mobility    Modified Rankin (Stroke Patients Only)       Balance Overall balance assessment: Needs assistance           Standing balance-Leahy Scale: Fair                               Pertinent Vitals/Pain Pain Assessment: No/denies pain    Home Living  Family/patient expects to be discharged to:: Private residence   Available Help at Discharge: Family Type of Home: Apartment Home Access: Stairs to enter Entrance Stairs-Rails: None Entrance Stairs-Number of Steps: 3  separated stairs    Home Equipment: None Additional Comments: O2 and nebs at home    Prior Function Level of Independence: Independent               Hand Dominance        Extremity/Trunk Assessment   Upper Extremity Assessment Upper Extremity Assessment: Overall WFL for tasks assessed    Lower Extremity Assessment Lower Extremity Assessment: Overall WFL for tasks assessed       Communication   Communication: No difficulties  Cognition Arousal/Alertness: Awake/alert Behavior During Therapy: WFL for tasks assessed/performed Overall Cognitive Status: Within Functional Limits for tasks assessed                                        General Comments      Exercises     Assessment/Plan    PT Assessment Patient needs continued PT services  PT Problem List Decreased mobility;Decreased activity tolerance;Cardiopulmonary status limiting activity       PT Treatment Interventions DME instruction;Gait training;Stair training;Therapeutic exercise  PT Goals (Current goals can be found in the Care Plan section)  Acute Rehab PT Goals Patient Stated Goal: home and feel better PT Goal Formulation: With patient Time For Goal Achievement: 11/25/16 Potential to Achieve Goals: Good    Frequency Min 2X/week   Barriers to discharge        Co-evaluation               AM-PAC PT "6 Clicks" Daily Activity  Outcome Measure Difficulty turning over in bed (including adjusting bedclothes, sheets and blankets)?: None Difficulty moving from lying on back to sitting on the side of the bed? : None Difficulty sitting down on and standing up from a chair with arms (e.g., wheelchair, bedside commode, etc,.)?: A Little Help needed moving to  and from a bed to chair (including a wheelchair)?: A Little Help needed walking in hospital room?: A Little Help needed climbing 3-5 steps with a railing? : A Little 6 Click Score: 20    End of Session   Activity Tolerance: Patient tolerated treatment well Patient left: in bed;with call bell/phone within reach   PT Visit Diagnosis: Difficulty in walking, not elsewhere classified (R26.2)    Time: 9678-9381 PT Time Calculation (min) (ACUTE ONLY): 17 min   Charges:   PT Evaluation $PT Eval Low Complexity: 1 Low     PT G CodesDrucilla Chalet, PT Pager: [phone] 11/20/2016   [facility] 11/20/2016, 3:27 PM

## 2016-11-20 NOTE — Progress Notes (Signed)
PROGRESS NOTE    Matthew Morrison  ZOX:[id number] DOB: 1955-08-24 DOA: 11/19/2016 PCP: Bing Neighbors, FNP     Brief Narrative:  Matthew Morrison is a 61 y.o. male with medical history significant for emphysema and hypertension came to the emergency department for evaluation of worsening dyspnea and wheezing. Patient reports that he is been on 20 mg of prednisone daily for the past couple weeks since his discharge from the hospital. He had been fairly stable until morning of admission when he began to experience worsening in his chronic dyspnea. Despite his home inhalers and nebulizers, he continued to worsen over the course of the day and now presents with severe dyspnea at rest. He was admitted for COPD exacerbation.   Assessment & Plan:   Principal Problem:   COPD exacerbation (HCC) Active Problems:   Acute respiratory failure with hypoxia (HCC)   COPD GOLD IV   Essential hypertension   Hyperkalemia   COPD with acute exacerbation (HCC)   COPD with acute exacerbation  -CXR: Findings of COPD.  No acute cardiopulmonary process seen -Continue albuterol, duonebs, azithromax, solumederol   Hypertension  -Currently not taking ARB at home  -BP stable currently. Hydralazin prn   Hyperkalemia  -Resolved   Severe malnutrition in context of chronic illness -Dietitian consulted     DVT prophylaxis: lovenox Code Status: full Family Communication: no family at bedside Disposition Plan: pending improvement   Consultants:   none  Procedures:   none  Antimicrobials:  Anti-infectives    Start     Dose/Rate Route Frequency Ordered Stop   11/19/16 2300  azithromycin (ZITHROMAX) 500 mg in dextrose 5 % 250 mL IVPB     500 mg 250 mL/hr over 60 Minutes Intravenous Daily at bedtime 11/19/16 2241         Subjective: Patient states that he does feel a little bit better since admission to the hospital. He was having shortness of breath as well as diffuse wheezing prior to admission. He  states that he ran out of his albuterol and DuoNeb. He has only been taking budesonide at home. He has been on prednisone 20 mg daily since his discharge from the hospital, this was prescribed by his primary care physician.  Objective: Vitals:   11/20/16 0128 11/20/16 0540 11/20/16 0836 11/20/16 0839  BP: 133/77 138/89    Pulse: (!) 108 93    Resp: 16 18    Temp: 99 F (37.2 C) 98.3 F (36.8 C)    TempSrc: Oral Oral    SpO2: 99% 97% 97% 97%  Weight: 48.8 kg (107 lb 9.6 oz)     Height: 5\' 4"  (1.626 m)       Intake/Output Summary (Last 24 hours) at 11/20/16 1259 Last data filed at 11/20/16 0630  Gross per 24 hour  Intake              250 ml  Output                1 ml  Net              249 ml   Filed Weights   11/20/16 0128  Weight: 48.8 kg (107 lb 9.6 oz)    Examination:  General exam: Appears calm and comfortable, very thin and frail  Respiratory system: Wheezes diffusely, Respiratory effort normal. Cardiovascular system: S1 & S2 heard, RRR. No JVD, murmurs, rubs, gallops or clicks. No pedal edema. Gastrointestinal system: Abdomen is nondistended, soft and nontender. No organomegaly or  masses felt. Normal bowel sounds heard. Central nervous system: Alert and oriented. No focal neurological deficits. Extremities: Symmetric 5 x 5 power. Skin: No rashes, lesions or ulcers Psychiatry: Judgement and insight appear normal. Mood & affect appropriate.   Data Reviewed: I have personally reviewed following labs and imaging studies  CBC:  Recent Labs Lab 11/19/16 2030  WBC 8.4  NEUTROABS 4.8  HGB 13.0  HCT 38.4*  MCV 71.1*  PLT 262   Basic Metabolic Panel:  Recent Labs Lab 11/19/16 2030 11/20/16 0521  NA 138 135  K 5.3* 4.1  CL 96* 96*  CO2 33* 32  GLUCOSE 121* 214*  BUN 7 15  CREATININE 0.63 0.71  CALCIUM 9.1 9.2   GFR: Estimated Creatinine Clearance: 69.5 mL/min (by C-G formula based on SCr of 0.71 mg/dL). Liver Function Tests:  Recent Labs Lab  11/19/16 2030  AST 28  ALT 18  ALKPHOS 41  BILITOT 1.0  PROT 7.3  ALBUMIN 3.8   No results for input(s): LIPASE, AMYLASE in the last 168 hours. No results for input(s): AMMONIA in the last 168 hours. Coagulation Profile: No results for input(s): INR, PROTIME in the last 168 hours. Cardiac Enzymes: No results for input(s): CKTOTAL, CKMB, CKMBINDEX, TROPONINI in the last 168 hours. BNP (last 3 results) No results for input(s): PROBNP in the last 8760 hours. HbA1C: No results for input(s): HGBA1C in the last 72 hours. CBG:  Recent Labs Lab 11/20/16 0740  GLUCAP 148*   Lipid Profile: No results for input(s): CHOL, HDL, LDLCALC, TRIG, CHOLHDL, LDLDIRECT in the last 72 hours. Thyroid Function Tests: No results for input(s): TSH, T4TOTAL, FREET4, T3FREE, THYROIDAB in the last 72 hours. Anemia Panel: No results for input(s): VITAMINB12, FOLATE, FERRITIN, TIBC, IRON, RETICCTPCT in the last 72 hours. Sepsis Labs: No results for input(s): PROCALCITON, LATICACIDVEN in the last 168 hours.  Recent Results (from the past 240 hour(s))  Culture, sputum-assessment     Status: None   Collection Time: 11/19/16 10:41 PM  Result Value Ref Range Status   Specimen Description SPUTUM  Final   Special Requests Immunocompromised  Final   Sputum evaluation   Final    Sputum specimen not acceptable for testing.  Please recollect.   L PATRAM AT 0624 ON 08.19.2018 BY NBROOKS    Report Status 11/20/2016 FINAL  Final       Radiology Studies: Dg Chest 2 View  Result Date: 11/19/2016 CLINICAL DATA:  Acute onset of shortness of breath. Initial encounter. EXAM: CHEST  2 VIEW COMPARISON:  Chest radiograph performed 11/01/2016 FINDINGS: The lungs are hyperexpanded, with flattening of the hemidiaphragms, compatible with COPD. There is no evidence of focal opacification, pleural effusion or pneumothorax. The heart is normal in size; the mediastinal contour is within normal limits. No acute osseous  abnormalities are seen. IMPRESSION: Findings of COPD.  No acute cardiopulmonary process seen. Electronically Signed   By: Roanna Raider M.D.   On: 11/19/2016 21:11      Scheduled Meds: . enoxaparin (LOVENOX) injection  40 mg Subcutaneous QHS  . famotidine  20 mg Oral QHS  . feeding supplement  1 Container Oral TID BM  . ipratropium-albuterol  3 mL Nebulization QID  . mouth rinse  15 mL Mouth Rinse BID  . methylPREDNISolone (SOLU-MEDROL) injection  60 mg Intravenous Q6H  . pantoprazole  40 mg Oral Daily  . sodium chloride flush  3 mL Intravenous Q12H  . sodium chloride flush  3 mL Intravenous Q12H   Continuous  Infusions: . sodium chloride    . azithromycin Stopped (11/20/16 0500)     LOS: 1 day    Time spent: 40 minutes   Noralee Stain, DO [facility] [email or web address] Password [facility] 11/20/2016, 12:59 PM

## 2016-11-21 ENCOUNTER — Encounter (HOSPITAL_COMMUNITY): Payer: Self-pay

## 2016-11-21 ENCOUNTER — Encounter: Payer: Self-pay | Admitting: Internal Medicine

## 2016-11-21 DIAGNOSIS — E43 Unspecified severe protein-calorie malnutrition: Secondary | ICD-10-CM | POA: Insufficient documentation

## 2016-11-21 LAB — BASIC METABOLIC PANEL
Anion gap: 7 (ref 5–15)
BUN: 17 mg/dL (ref 6–20)
CHLORIDE: 98 mmol/L — AB (ref 101–111)
CO2: 30 mmol/L (ref 22–32)
CREATININE: 0.59 mg/dL — AB (ref 0.61–1.24)
Calcium: 8.9 mg/dL (ref 8.9–10.3)
GFR calc Af Amer: >60 mL/min (ref >60)
GFR calc non Af Amer: >60 mL/min (ref >60)
GLUCOSE: 167 mg/dL — AB (ref 65–99)
Potassium: 4.1 mmol/L (ref 3.5–5.1)
Sodium: 135 mmol/L (ref 135–145)

## 2016-11-21 LAB — GLUCOSE, CAPILLARY: Glucose-Capillary: 149 mg/dL — ABNORMAL HIGH (ref 65–99)

## 2016-11-21 MED ORDER — ALBUTEROL SULFATE HFA 108 (90 BASE) MCG/ACT IN AERS
2.0000 | INHALATION_SPRAY | Freq: Four times a day (QID) | RESPIRATORY_TRACT | Status: DC | PRN
  Administered 2016-11-21: 2 via RESPIRATORY_TRACT
  Filled 2016-11-21: qty 6.7

## 2016-11-21 MED ORDER — PREDNISONE 50 MG PO TABS
50.0000 mg | ORAL_TABLET | Freq: Every day | ORAL | Status: DC
  Administered 2016-11-22: 50 mg via ORAL
  Filled 2016-11-21: qty 1

## 2016-11-21 NOTE — Progress Notes (Signed)
Initial Nutrition Assessment  DOCUMENTATION CODES:   Severe malnutrition in context of chronic illness, Underweight  INTERVENTION:  - Continue Boost Breeze TID, each supplement provides 250 kcal and 9 grams of protein - Continue to encourage PO intakes of meals and supplements.  - RD will continue to monitor for additional needs.  NUTRITION DIAGNOSIS:   Malnutrition (severe) related to chronic illness (COPD) as evidenced by severe depletion of muscle mass, severe depletion of body fat.  GOAL:   Patient will meet greater than or equal to 90% of their needs  MONITOR:   PO intake, Supplement acceptance, Weight trends, Labs  REASON FOR ASSESSMENT:   Consult COPD Protocol  ASSESSMENT:   61 y.o. male with medical history significant for emphysema and hypertension, and into the emergency department for evaluation of worsening dyspnea and wheezing. Patient reports that he is been on 20 mg of prednisone daily for the past couple weeks and had been fairly stable until this morning when he began to experience worsening in his chronic dyspnea. Despite his home inhalers and nebulizers, he continued to worsen over the course of the day and now presents with severe dyspnea at rest.   Pt seen for consult. BMI indicates underweight status. No intakes documented since admission but visualized lunch tray from today with 100% completion. Boost Breeze was ordered TID and pt has accepted 4/4 offered. Pt was seen by another RD ~3 weeks ago during a previous admission. Note from that visit reviewed in detail. Pt confirms ongoing very good appetite and that he has been trying to eat 4 smaller meals rather than 3 large meals/day. He had previously reported vomiting of dinner the next morning d/t large amount of mucus; he states that change in meals was made in an attempt to help with this and that medication has also been prescribed this admission to help. Pt does feel that he becomes more SOB with intakes  sometimes and that he tries to consume items slower to help. Pt denies any nutrition-related questions or concerns at this time and feels that he will be going home tomorrow. Encouraged him to alert RN should he have questions for RD.   Physical assessment shows severe fat wasting, mild/moderate muscle wasting, severe fat wasting, and no edema. Pt reports that he has gained weight recently. This is confirmed by chart review indicating that he weighed 104 lbs on 11/01/16 and currently weighs 108 lbs.   Medications reviewed; 20 mg oral Pepcid/day, 40 mg oral Protonix/day, 50 mg Deltasone/day. Labs reviewed; CBGs: 148 and 149 mg/dL today, Cl: 98 mmol/L, creatinine: 0.59 mg/dL.    Diet Order:  Diet regular Room service appropriate? Yes; Fluid consistency: Thin  Skin:  Reviewed, no issues  Last BM:  8/19  Height:   Ht Readings from Last 1 Encounters:  11/20/16 5\' 4"  (1.626 m)    Weight:   Wt Readings from Last 1 Encounters:  11/21/16 108 lb 6.4 oz (49.2 kg)    Ideal Body Weight:  59.09 kg  BMI:  Body mass index is 18.61 kg/m.  Estimated Nutritional Needs:   Kcal:  1625-1820 (33-37 kcal/kg)  Protein:  70-80 grams  Fluid:  >/= 1.6 L/day  EDUCATION NEEDS:   No education needs identified at this time     Trenton Gammon, MS, RD, LDN, CNSC Inpatient Clinical Dietitian Pager # [phone] After hours/weekend pager # [phone]

## 2016-11-21 NOTE — Assessment & Plan Note (Signed)
Quit smoking 09/2016 - Spirometry 10/06/2016  FEV1 0.62 (25%)  Ratio 35   - 10/06/2016    try stiolto 2 each am x 4 week samples  - 11/17/2016  After extensive coaching HFA effectiveness =    75% (Ti short) - 11/17/2016 maintain pred 20 mg daily and added gerd r  DDX of  difficult airways management almost all start with A and  include Adherence, Ace Inhibitors, Acid Reflux, Active Sinus Disease, Alpha 1 Antitripsin deficiency, Anxiety masquerading as Airways dz,  ABPA,  Allergy(esp in young), Aspiration (esp in elderly), Adverse effects of meds,  Active smokers, A bunch of PE's (a small clot burden can't cause this syndrome unless there is already severe underlying pulm or vascular dz with poor reserve) plus two Bs  = Bronchiectasis and Beta blocker use..and one C= CHF   Adherence is always the initial "prime suspect" and is a multilayered concern that requires a "trust but verify" approach in every patient - starting with knowing how to use medications, especially inhalers, correctly, keeping up with refills and understanding the fundamental difference between maintenance and prns vs those medications only taken for a very short course and then stopped and not refilled.  - see hfa teaching > if can't master hfa may need to change to neb laba/ics if can get access to it  - return with all meds in hand using a trust but verify approach to confirm accurate Medication  Reconciliation The principal here is that until we are certain that the  patients are doing what we've asked, it makes no sense to ask them to do more.   ? Acid (or non-acid) GERD > always difficult to exclude as up to 75% of pts in some series report no assoc GI/ Heartburn symptoms> rec max (24h)  acid suppression and diet restrictions/ reviewed and instructions given in writing.   ? Adverse effects of meds/ surreptitious drug use ? Consider UDS next admt  ? Allergy / asthma component >  The goal with a chronic steroid dependent illness is  always arriving at the lowest effective dose that controls the disease/symptoms and not accepting a set "formula" which is based on statistics or guidelines that don't always take into account patient  variability or the natural hx of the dz in every individual patient, which may well vary over time.  For now therefore I recommend the patient maintain  20 mg daily until next ov   ? Anxiety/depression  > usually at the bottom of this list of usual suspects but should be  higher on this pt's based on H and P   ? chf > nothing to suggest    I had an extended discussion with the patient reviewing all relevant studies completed to date and  lasting 15 to 20 minutes of a 25 minute visit    Each maintenance medication was reviewed in detail including most importantly the difference between maintenance and prns and under what circumstances the prns are to be triggered using an action plan format that is not reflected in the computer generated alphabetically organized AVS.    Please see AVS for specific instructions unique to this visit that I personally wrote and verbalized to the the pt in detail and then reviewed with pt  by my nurse highlighting any  changes in therapy recommended at today's visit to their plan of care.

## 2016-11-21 NOTE — Assessment & Plan Note (Signed)
10/06/2016  Walked RA x 3 laps @ 185 ft each stopped due to  End of study, slow pace and tired at end > sob but no desat  As of 10/06/2016 02 changed to prn

## 2016-11-21 NOTE — Care Management Note (Signed)
Case Management Note  Patient Details  Name: Matthew Morrison MRN: [id number] Date of Birth: [birth year]  Subjective/Objective:  61 y/o m admitted w/COPD. From home. Patient states he is unable to get his ventolin @ his pharmacy-informed patient of medicaid policy-preferred meds, & limit on refills-the hospital is unable to override medicaid policy-encouraged patient to talk to his  case worker @ [facility]-they are managing his outpatient services. Patient voiced understanding.Will confirm if [facility] is following also in outpatient setting.No further CM needs.                   Action/Plan:d/c plan home.   Expected Discharge Date:                  Expected Discharge Plan:  Home/Self Care  In-House Referral:     Discharge planning Services     Post Acute Care Choice:  Durable Medical Equipment (home 02, neb machine) Choice offered to:     DME Arranged:    DME Agency:     HH Arranged:    HH Agency:     Status of Service:  In process, will continue to follow  If discussed at Long Length of Stay Meetings, dates discussed:    Additional Comments:  Lanier Clam, RN 11/21/2016, 10:48 AM

## 2016-11-21 NOTE — Evaluation (Signed)
Occupational Therapy Evaluation Patient Details Name: Matthew Morrison MRN: [id number] DOB: 31-Mar-1956 Today's Date: 11/21/2016    History of Present Illness 61 y/o admitted with acute exacerbation of COPD PMH: COPD, HTN   Clinical Impression   Pt is overall performing ADL and mobility at a modified independent level. Ambulated with a RW this visit for energy conservation and safety. Educated pt at length in pacing, breathing techniques, use of 02 with showering and benefits of rollator and shower seat. No further OT needs.    Follow Up Recommendations  No OT follow up    Equipment Recommendations  Tub/shower seat (4 wheeled walker)    Recommendations for Other Services       Precautions / Restrictions Precautions Precautions: Fall      Mobility Bed Mobility Overal bed mobility: Modified Independent             General bed mobility comments: HOB up  Transfers Overall transfer level: Needs assistance Equipment used: Rolling walker (2 wheeled) Transfers: Sit to/from Stand Sit to Stand: Modified independent (Device/Increase time)              Balance             Standing balance-Leahy Scale: Fair                             ADL either performed or assessed with clinical judgement   ADL Overall ADL's : Modified independent                                       General ADL Comments: Pt educated in energy conservation strategies and benefits of rollator for mobility and seated ADL in kitchen and bathroom. Pt also educated in benefits of seated showering. Agreeable to OT ordering shower seat.     Vision Baseline Vision/History: Wears glasses Wears Glasses: Reading only Patient Visual Report: No change from baseline       Perception     Praxis      Pertinent Vitals/Pain Pain Assessment: No/denies pain     Hand Dominance Right   Extremity/Trunk Assessment Upper Extremity Assessment Upper Extremity Assessment: Overall  WFL for tasks assessed   Lower Extremity Assessment Lower Extremity Assessment: Defer to PT evaluation       Communication Communication Communication: No difficulties   Cognition Arousal/Alertness: Awake/alert Behavior During Therapy: WFL for tasks assessed/performed Overall Cognitive Status: Within Functional Limits for tasks assessed                                     General Comments       Exercises     Shoulder Instructions      Home Living Family/patient expects to be discharged to:: Private residence Living Arrangements: Other relatives (brother) Available Help at Discharge: Family;Available 24 hours/day Type of Home: Apartment Home Access: Stairs to enter Entrance Stairs-Number of Steps: 3  separated stairs  Entrance Stairs-Rails: None Home Layout: One level     Bathroom Shower/Tub: Chief Strategy Officer: Standard     Home Equipment: Other (comment) (oxygen)   Additional Comments: O2 and nebs at home      Prior Functioning/Environment Level of Independence: Independent        Comments: ADL and IADL require extended period of  time and rest breaks        OT Problem List: Impaired balance (sitting and/or standing);Decreased activity tolerance;Cardiopulmonary status limiting activity;Decreased knowledge of use of DME or AE      OT Treatment/Interventions:      OT Goals(Current goals can be found in the care plan section) Acute Rehab OT Goals Patient Stated Goal: home and feel better  OT Frequency:     Barriers to D/C:            Co-evaluation              AM-PAC PT "6 Clicks" Daily Activity     Outcome Measure Help from another person eating meals?: None Help from another person taking care of personal grooming?: None Help from another person toileting, which includes using toliet, bedpan, or urinal?: None Help from another person bathing (including washing, rinsing, drying)?: None Help from another  person to put on and taking off regular upper body clothing?: None Help from another person to put on and taking off regular lower body clothing?: None 6 Click Score: 24   End of Session Equipment Utilized During Treatment: Gait belt;Rolling walker;Oxygen  Activity Tolerance: Patient limited by fatigue Patient left: in bed;with call bell/phone within reach  OT Visit Diagnosis: Unsteadiness on feet (R26.81)                Time: 8138-8719 OT Time Calculation (min): 16 min Charges:  OT General Charges $OT Visit: 1 Procedure OT Evaluation $OT Eval Moderate Complexity: 1 Procedure G-Codes:     Evern Bio 11/21/2016, 2:52 PM  11/21/2016 Martie Round, OTR/L Pager: [phone]

## 2016-11-21 NOTE — Progress Notes (Signed)
CSW received consult for COPD Gold Protocol - CSW administered Depression Scale (patient scored 2/27) & Generalized Anxiety Disorder scale (patient scored 1/21). Screening forms placed on shadow chart. Patient states that he has been taking medication for his anxiety which he feels has been helpful. Patient declined therapy resources, noting that he is coping well with his COPD. Patient states that he plans to return home at discharge.  No further CSW needs identified.   Celso Sickle, [place] Clinical Social Worker [facility] Cell#: [phone]

## 2016-11-21 NOTE — Progress Notes (Addendum)
PROGRESS NOTE    Matthew Morrison  XHB:[id number] DOB: [birth year] DOA: 11/19/2016 PCP: Bing Neighbors, FNP     Brief Narrative:  Matthew Morrison is a 61 y.o. male with medical history significant for emphysema and hypertension came to the emergency department for evaluation of worsening dyspnea and wheezing. Patient reports that he is been on 20 mg of prednisone daily for the past couple weeks since his discharge from the hospital. He had been fairly stable until morning of admission when he began to experience worsening in his chronic dyspnea. Despite his home inhalers and nebulizers, he continued to worsen over the course of the day and now presents with severe dyspnea at rest. He was admitted for COPD exacerbation.   Assessment & Plan:   Principal Problem:   COPD exacerbation (HCC) Active Problems:   Acute respiratory failure with hypoxia (HCC)   COPD GOLD IV   Essential hypertension   Hyperkalemia   COPD with acute exacerbation (HCC)   COPD with acute exacerbation  -CXR: Findings of COPD.  No acute cardiopulmonary process seen -Continue albuterol, duonebs, azithromax, transition to PO steroids today   Hypertension  -Currently not taking ARB at home  -BP stable currently. Hydralazin prn   Hyperkalemia  -Resolved   Severe malnutrition in context of chronic illness -Dietitian consulted     DVT prophylaxis: lovenox Code Status: full Family Communication: no family at bedside Disposition Plan: pending improvement, hopefully dc next 24 hours    Consultants:   none  Procedures:   none  Antimicrobials:  Anti-infectives    Start     Dose/Rate Route Frequency Ordered Stop   11/19/16 2300  azithromycin (ZITHROMAX) 500 mg in dextrose 5 % 250 mL IVPB     500 mg 250 mL/hr over 60 Minutes Intravenous Daily at bedtime 11/19/16 2241         Subjective: States he is better than admission, but continues to be short of breath, continues to have wheezes. He is concerned  regarding his home inhalers and affordability. PACs on tele   Objective: Vitals:   11/20/16 2054 11/20/16 2208 11/21/16 0429 11/21/16 0800  BP:  (!) 135/93 (!) 152/95   Pulse:  (!) 104 (!) 101   Resp:  17 18   Temp:  98.4 F (36.9 C) 98.1 F (36.7 C)   TempSrc:  Oral Oral   SpO2: 97% 100% 96% 98%  Weight:   49.2 kg (108 lb 6.4 oz)   Height:        Intake/Output Summary (Last 24 hours) at 11/21/16 1242 Last data filed at 11/21/16 0200  Gross per 24 hour  Intake              250 ml  Output                2 ml  Net              248 ml   Filed Weights   11/20/16 0128 11/21/16 0429  Weight: 48.8 kg (107 lb 9.6 oz) 49.2 kg (108 lb 6.4 oz)    Examination:  General exam: Appears calm and comfortable, very thin and frail  Respiratory system: Wheezes diffusely, Respiratory effort normal. Cardiovascular system: S1 & S2 heard, RRR. No JVD, murmurs, rubs, gallops or clicks. No pedal edema. Gastrointestinal system: Abdomen is nondistended, soft and nontender. No organomegaly or masses felt. Normal bowel sounds heard. Central nervous system: Alert and oriented. No focal neurological deficits. Extremities: Symmetric 5 x 5 power.  Skin: No rashes, lesions or ulcers Psychiatry: Judgement and insight appear normal. Mood & affect appropriate.   Data Reviewed: I have personally reviewed following labs and imaging studies  CBC:  Recent Labs Lab 11/19/16 2030  WBC 8.4  NEUTROABS 4.8  HGB 13.0  HCT 38.4*  MCV 71.1*  PLT 262   Basic Metabolic Panel:  Recent Labs Lab 11/19/16 2030 11/20/16 0521 11/21/16 0501  NA 138 135 135  K 5.3* 4.1 4.1  CL 96* 96* 98*  CO2 33* 32 30  GLUCOSE 121* 214* 167*  BUN 7 15 17   CREATININE 0.63 0.71 0.59*  CALCIUM 9.1 9.2 8.9   GFR: Estimated Creatinine Clearance: 70 mL/min (A) (by C-G formula based on SCr of 0.59 mg/dL (L)). Liver Function Tests:  Recent Labs Lab 11/19/16 2030  AST 28  ALT 18  ALKPHOS 41  BILITOT 1.0  PROT 7.3    ALBUMIN 3.8   No results for input(s): LIPASE, AMYLASE in the last 168 hours. No results for input(s): AMMONIA in the last 168 hours. Coagulation Profile: No results for input(s): INR, PROTIME in the last 168 hours. Cardiac Enzymes: No results for input(s): CKTOTAL, CKMB, CKMBINDEX, TROPONINI in the last 168 hours. BNP (last 3 results) No results for input(s): PROBNP in the last 8760 hours. HbA1C: No results for input(s): HGBA1C in the last 72 hours. CBG:  Recent Labs Lab 11/20/16 0740 11/21/16 0741  GLUCAP 148* 149*   Lipid Profile: No results for input(s): CHOL, HDL, LDLCALC, TRIG, CHOLHDL, LDLDIRECT in the last 72 hours. Thyroid Function Tests: No results for input(s): TSH, T4TOTAL, FREET4, T3FREE, THYROIDAB in the last 72 hours. Anemia Panel: No results for input(s): VITAMINB12, FOLATE, FERRITIN, TIBC, IRON, RETICCTPCT in the last 72 hours. Sepsis Labs: No results for input(s): PROCALCITON, LATICACIDVEN in the last 168 hours.  Recent Results (from the past 240 hour(s))  Culture, sputum-assessment     Status: None   Collection Time: 11/19/16 10:41 PM  Result Value Ref Range Status   Specimen Description SPUTUM  Final   Special Requests Immunocompromised  Final   Sputum evaluation   Final    Sputum specimen not acceptable for testing.  Please recollect.   L PATRAM AT 0624 ON 08.19.2018 BY NBROOKS    Report Status 11/20/2016 FINAL  Final       Radiology Studies: Dg Chest 2 View  Result Date: 11/19/2016 CLINICAL DATA:  Acute onset of shortness of breath. Initial encounter. EXAM: CHEST  2 VIEW COMPARISON:  Chest radiograph performed 11/01/2016 FINDINGS: The lungs are hyperexpanded, with flattening of the hemidiaphragms, compatible with COPD. There is no evidence of focal opacification, pleural effusion or pneumothorax. The heart is normal in size; the mediastinal contour is within normal limits. No acute osseous abnormalities are seen. IMPRESSION: Findings of COPD.  No  acute cardiopulmonary process seen. Electronically Signed   By: Roanna Raider M.D.   On: 11/19/2016 21:11      Scheduled Meds: . enoxaparin (LOVENOX) injection  40 mg Subcutaneous QHS  . famotidine  20 mg Oral QHS  . feeding supplement  1 Container Oral TID BM  . ipratropium-albuterol  3 mL Nebulization QID  . mouth rinse  15 mL Mouth Rinse BID  . pantoprazole  40 mg Oral Daily  . [START ON 11/22/2016] predniSONE  50 mg Oral Q breakfast  . sodium chloride flush  3 mL Intravenous Q12H  . sodium chloride flush  3 mL Intravenous Q12H   Continuous Infusions: . sodium chloride    .  azithromycin Stopped (11/20/16 2300)     LOS: 2 days    Time spent: 30 minutes   Noralee Stain, DO [facility] [email or web address] Password [email or web address] 11/21/2016, 12:42 PM

## 2016-11-22 LAB — GLUCOSE, CAPILLARY: GLUCOSE-CAPILLARY: 108 mg/dL — AB (ref 65–99)

## 2016-11-22 MED ORDER — ALBUTEROL SULFATE (2.5 MG/3ML) 0.083% IN NEBU: 2.5000 mg | INHALATION_SOLUTION | Freq: Four times a day (QID) | RESPIRATORY_TRACT | 0 refills | Status: DC

## 2016-11-22 MED ORDER — BUDESONIDE 0.25 MG/2ML IN SUSP: 0.2500 mg | Freq: Two times a day (BID) | RESPIRATORY_TRACT | 0 refills | Status: DC

## 2016-11-22 MED ORDER — IPRATROPIUM BROMIDE 0.02 % IN SOLN: 0.5000 mg | Freq: Four times a day (QID) | RESPIRATORY_TRACT | 0 refills | Status: DC

## 2016-11-22 MED ORDER — AZITHROMYCIN 500 MG PO TABS: 500.0000 mg | ORAL_TABLET | Freq: Every day | ORAL | 0 refills | Status: AC

## 2016-11-22 MED ORDER — PREDNISONE 10 MG PO TABS: ORAL_TABLET | ORAL | 0 refills | Status: DC

## 2016-11-22 MED FILL — IPRATROPIUM BR 0.02% SOLN: 0.02 | 12 days supply | Qty: 125 | Fill #0

## 2016-11-22 MED FILL — predniSONE 10 MG TABS: 10 | 16 days supply | Qty: 32 | Fill #0

## 2016-11-22 MED FILL — AZITHROMYCIN 500 MG TABLET: 500 | 1 days supply | Qty: 1 | Fill #0

## 2016-11-22 MED FILL — PULMICORT 0.25 MG/2 ML RESP: 0.25 | 30 days supply | Qty: 120 | Fill #0

## 2016-11-22 NOTE — Care Management Note (Signed)
Case Management Note  Patient Details  Name: Matthew Morrison MRN: [id number] Date of Birth: 1956-02-05  Subjective/Objective:  No further CM needs.                  Action/Plan:d/c home.   Expected Discharge Date:  11/22/16               Expected Discharge Plan:  Home/Self Care  In-House Referral:     Discharge planning Services     Post Acute Care Choice:  Durable Medical Equipment (home 02, neb machine) Choice offered to:     DME Arranged:    DME Agency:     HH Arranged:    HH Agency:     Status of Service:  Completed, signed off  If discussed at [employer] of Stay Meetings, dates discussed:    Additional Comments:  Lanier Clam, RN 11/22/2016, 12:14 PM

## 2016-11-22 NOTE — Discharge Summary (Signed)
Physician Discharge Summary  Matthew Morrison WUJ:[id number] DOB: May 14, 1955 DOA: 11/19/2016  PCP: Bing Neighbors, FNP  Admit date: 11/19/2016 Discharge date: 11/22/2016  Admitted From: Home Disposition:  Home  Recommendations for Outpatient Follow-up:  1. Follow up with PCP in 1 week 2. Follow up with Dr. Sherene Sires as scheduled  Home Health: No  Equipment/Devices: None   Discharge Condition: Stable CODE STATUS: Full  Diet recommendation: Heart healthy   Brief/Interim Summary: Matthew Morrison a 61 y.o.malewith medical history significant for emphysema and hypertension came to the emergency department for evaluation of worsening dyspnea and wheezing. Patient reports that he is been on 20 mg of prednisone daily for the past couple weeks since his discharge from the hospital. He had been fairly stable until morning of admission when he began to experience worsening in his chronic dyspnea. Despite his home inhalers and nebulizers, he continued to worsen over the course of the day and now presents with severe dyspnea at rest. He was admitted for COPD exacerbation.   Discharge Diagnoses:  Principal Problem:   COPD exacerbation (HCC) Active Problems:   Acute respiratory failure with hypoxia (HCC)   COPD GOLD IV   Essential hypertension   Hyperkalemia   COPD with acute exacerbation (HCC)   Protein-calorie malnutrition, severe   COPD with acute exacerbation  -CXR: Findings of COPD. No acute cardiopulmonary process seen -Continue albuterol, duonebs, azithromax, transition to PO steroid taper   Hypertension  -Currently not taking ARB at home  -BP stable currently  Hyperkalemia  -Resolved   Severe malnutrition in context of chronic illness -Dietitian consulted    Discharge Instructions  Discharge Instructions    Call MD for:  difficulty breathing, headache or visual disturbances    Complete by:  As directed    Call MD for:  extreme fatigue    Complete by:  As directed    Call  MD for:  hives    Complete by:  As directed    Call MD for:  persistant dizziness or light-headedness    Complete by:  As directed    Call MD for:  persistant nausea and vomiting    Complete by:  As directed    Call MD for:  severe uncontrolled pain    Complete by:  As directed    Call MD for:  temperature >100.4    Complete by:  As directed    Diet - low sodium heart healthy    Complete by:  As directed    Discharge instructions    Complete by:  As directed    You were cared for by a hospitalist during your hospital stay. If you have any questions about your discharge medications or the care you received while you were in the hospital after you are discharged, you can call the unit and asked to speak with the hospitalist on call if the hospitalist that took care of you is not available. Once you are discharged, your primary care physician will handle any further medical issues. Please note that NO REFILLS for any discharge medications will be authorized once you are discharged, as it is imperative that you return to your primary care physician (or establish a relationship with a primary care physician if you do not have one) for your aftercare needs so that they can reassess your need for medications and monitor your lab values.   Increase activity slowly    Complete by:  As directed      Allergies as of 11/22/2016  Reactions   Norflex [orphenadrine] Hives      Medication List    STOP taking these medications   irbesartan 75 MG tablet Commonly known as:  AVAPRO     TAKE these medications   albuterol 108 (90 Base) MCG/ACT inhaler Commonly known as:  PROVENTIL HFA;VENTOLIN HFA Inhale 2 puffs into the lungs every 4 (four) hours as needed for wheezing or shortness of breath.   albuterol (2.5 MG/3ML) 0.083% nebulizer solution Commonly known as:  PROVENTIL Take 3 mLs (2.5 mg total) by nebulization 4 (four) times daily.   azithromycin 500 MG tablet Commonly known as:   ZITHROMAX Take 1 tablet (500 mg total) by mouth daily.   budesonide 0.25 MG/2ML nebulizer solution Commonly known as:  PULMICORT Take 2 mLs (0.25 mg total) by nebulization 2 (two) times daily. What changed:  when to take this  reasons to take this   famotidine 20 MG tablet Commonly known as:  PEPCID One at bedtime   hydrOXYzine 10 MG tablet Commonly known as:  ATARAX/VISTARIL Take 1 tablet (10 mg total) by mouth 3 (three) times daily as needed for anxiety.   ipratropium 0.02 % nebulizer solution Commonly known as:  ATROVENT Take 2.5 mLs (0.5 mg total) by nebulization 4 (four) times daily.   mupirocin cream 2 % Commonly known as:  BACTROBAN Apply topically 2 (two) times daily.   omeprazole 40 MG capsule Commonly known as:  PRILOSEC Take 1 capsule (40 mg total) by mouth daily.   OXYGEN 2 lpm with sleep and occ during the day   predniSONE 10 MG tablet Commonly known as:  DELTASONE Take 4 tabs for 3 days, then 3 tabs for 3 days, then 2 tabs for 3 days, then 1 tab for 3 days, then 1/2 tab for 4 days. What changed:  medication strength  how much to take  how to take this  when to take this  additional instructions   Tiotropium Bromide-Olodaterol 2.5-2.5 MCG/ACT Aers Commonly known as:  STIOLTO RESPIMAT Inhale 2 puffs into the lungs daily.      Follow-up Information    Bing Neighbors, FNP. Schedule an appointment as soon as possible for a visit in 1 week(s).   Specialty:  Family Medicine Contact information: [address] [place] [address] [phone]        Nyoka Cowden, MD. Go on 12/29/2016.   Specialty:  Pulmonary Disease Contact information: [address] [place] [address] [phone]          Allergies  Allergen Reactions  . Norflex [Orphenadrine] Hives    Consultations:  None   Procedures/Studies: Dg Chest 2 View  Result Date: 11/19/2016 CLINICAL DATA:  Acute onset of shortness of breath. Initial encounter.  EXAM: CHEST  2 VIEW COMPARISON:  Chest radiograph performed 11/01/2016 FINDINGS: The lungs are hyperexpanded, with flattening of the hemidiaphragms, compatible with COPD. There is no evidence of focal opacification, pleural effusion or pneumothorax. The heart is normal in size; the mediastinal contour is within normal limits. No acute osseous abnormalities are seen. IMPRESSION: Findings of COPD.  No acute cardiopulmonary process seen. Electronically Signed   By: Roanna Raider M.D.   On: 11/19/2016 21:11   Dg Chest 2 View  Result Date: 11/01/2016 CLINICAL DATA:  Shortness of breath EXAM: CHEST  2 VIEW COMPARISON:  09/26/2016 FINDINGS: Right upper lobe scarring. Right basilar scarring/ atelectasis. Hyperinflation. No focal consolidation. No pleural effusion or pneumothorax. The heart is normal in size. Visualized osseous structures  are within normal limits. IMPRESSION: No evidence of acute cardiopulmonary disease. Electronically Signed   By: Charline Bills M.D.   On: 11/01/2016 17:39       Discharge Exam: Vitals:   11/22/16 0746 11/22/16 1146  BP:    Pulse:    Resp:    Temp:    SpO2: 96% 97%   Vitals:   11/22/16 0500 11/22/16 0618 11/22/16 0746 11/22/16 1146  BP:  (!) 144/92    Pulse:  100    Resp:  20    Temp:  98.2 F (36.8 C)    TempSrc:  Oral    SpO2:  100% 96% 97%  Weight: 50.2 kg (110 lb 9.6 oz)     Height:        General: Pt is alert, awake, not in acute distress Cardiovascular: RRR, S1/S2 +, no rubs, no gallops Respiratory: CTA bilaterally, mild wheezing but much improved from admission, no rhonchi, comfortable on [place] O2, no distress, no conversational dyspea  Abdominal: Soft, NT, ND, bowel sounds + Extremities: no edema, no cyanosis    The results of significant diagnostics from this hospitalization (including imaging, microbiology, ancillary and laboratory) are listed below for reference.     Microbiology: Recent Results (from the past 240 hour(s))  Culture,  sputum-assessment     Status: None   Collection Time: 11/19/16 10:41 PM  Result Value Ref Range Status   Specimen Description SPUTUM  Final   Special Requests Immunocompromised  Final   Sputum evaluation   Final    Sputum specimen not acceptable for testing.  Please recollect.   L PATRAM AT 0624 ON 08.19.2018 BY NBROOKS    Report Status 11/20/2016 FINAL  Final     Labs: BNP (last 3 results)  Recent Labs  11/19/16 2030  BNP 26.3   Basic Metabolic Panel:  Recent Labs Lab 11/19/16 2030 11/20/16 0521 11/21/16 0501  NA 138 135 135  K 5.3* 4.1 4.1  CL 96* 96* 98*  CO2 33* 32 30  GLUCOSE 121* 214* 167*  BUN 7 15 17   CREATININE 0.63 0.71 0.59*  CALCIUM 9.1 9.2 8.9   Liver Function Tests:  Recent Labs Lab 11/19/16 2030  AST 28  ALT 18  ALKPHOS 41  BILITOT 1.0  PROT 7.3  ALBUMIN 3.8   No results for input(s): LIPASE, AMYLASE in the last 168 hours. No results for input(s): AMMONIA in the last 168 hours. CBC:  Recent Labs Lab 11/19/16 2030  WBC 8.4  NEUTROABS 4.8  HGB 13.0  HCT 38.4*  MCV 71.1*  PLT 262   Cardiac Enzymes: No results for input(s): CKTOTAL, CKMB, CKMBINDEX, TROPONINI in the last 168 hours. BNP: Invalid input(s): POCBNP CBG:  Recent Labs Lab 11/20/16 0740 11/21/16 0741 11/22/16 0733  GLUCAP 148* 149* 108*   D-Dimer No results for input(s): DDIMER in the last 72 hours. Hgb A1c No results for input(s): HGBA1C in the last 72 hours. Lipid Profile No results for input(s): CHOL, HDL, LDLCALC, TRIG, CHOLHDL, LDLDIRECT in the last 72 hours. Thyroid function studies No results for input(s): TSH, T4TOTAL, T3FREE, THYROIDAB in the last 72 hours.  Invalid input(s): FREET3 Anemia work up No results for input(s): VITAMINB12, FOLATE, FERRITIN, TIBC, IRON, RETICCTPCT in the last 72 hours. Urinalysis    Component Value Date/Time   COLORURINE STRAW (A) 09/20/2016 1216   APPEARANCEUR CLEAR 09/20/2016 1216   LABSPEC 1.025 10/03/2016 1002    PHURINE 7.0 10/03/2016 1002   GLUCOSEU NEGATIVE 10/03/2016 1002  HGBUR NEGATIVE 10/03/2016 1002   BILIRUBINUR NEGATIVE 10/03/2016 1002   KETONESUR NEGATIVE 10/03/2016 1002   PROTEINUR 30 (A) 10/03/2016 1002   UROBILINOGEN 1.0 10/03/2016 1002   NITRITE NEGATIVE 10/03/2016 1002   LEUKOCYTESUR NEGATIVE 10/03/2016 1002   Sepsis Labs Invalid input(s): PROCALCITONIN,  WBC,  LACTICIDVEN Microbiology Recent Results (from the past 240 hour(s))  Culture, sputum-assessment     Status: None   Collection Time: 11/19/16 10:41 PM  Result Value Ref Range Status   Specimen Description SPUTUM  Final   Special Requests Immunocompromised  Final   Sputum evaluation   Final    Sputum specimen not acceptable for testing.  Please recollect.   L PATRAM AT 0624 ON 08.19.2018 BY NBROOKS    Report Status 11/20/2016 FINAL  Final     Time coordinating discharge: 40 minutes  SIGNED:  Noralee Stain, DO [facility] Pager [phone]  If 7PM-7AM, please contact night-coverage [email or web address] Password [facility] 11/22/2016, 12:15 PM

## 2016-12-02 ENCOUNTER — Ambulatory Visit (INDEPENDENT_AMBULATORY_CARE_PROVIDER_SITE_OTHER): Payer: Medicaid Other | Admitting: Family Medicine

## 2016-12-02 ENCOUNTER — Encounter: Payer: Self-pay | Admitting: Family Medicine

## 2016-12-02 VITALS — BP 123/79 | HR 100 | Temp 98.0°F | Resp 14 | Ht 64.0 in | Wt 110.0 lb

## 2016-12-02 DIAGNOSIS — Z23 Encounter for immunization: Secondary | ICD-10-CM

## 2016-12-02 DIAGNOSIS — J449 Chronic obstructive pulmonary disease, unspecified: Secondary | ICD-10-CM | POA: Diagnosis not present

## 2016-12-02 NOTE — Patient Instructions (Signed)
Resume prednisone 20 mg daily after you complete the taper prescribed at the hospital.

## 2016-12-02 NOTE — Progress Notes (Signed)
Patient ID: Matthew Morrison Winther, male    DOB: [birth year], 61 y.o.   MRN: [id number]  PCP: Bing NeighborsHarris, Blanton Kardell S, FNP  Chief Complaint  Patient presents with  . Follow-up    sob    Subjective:  HPI  Matthew Morrison Shackleton is a 61 y.o. male with a history of COPD Stage IV, presents for hospital follow-up. Annabelle HarmanDana was readmitted again for COPD exacerbation on 11/19/2016 in which he remained impatient for 3 days. Reports improvement of symptoms since discharge. He is completing the last of a 16 day prednisone taper. He is also on chronic home oxygen, however reports that he has to only use at bedtime. Continues to administer nebulizer treatments at home every 4-6 hours.  Annabelle HarmanDana has a follow-up scheduled with Dr. Sherene SiresWert, pulmonology on 12/29/2016. He reports no other complaints today. Social History   Social History  . Marital status: Single    Spouse name: N/A  . Number of children: N/A  . Years of education: N/A   Occupational History  . Not on file.   Social History Main Topics  . Smoking status: Former Smoker    Packs/day: 1.00    Years: 40.00    Types: Cigarettes    Quit date: 09/06/2016  . Smokeless tobacco: Never Used  . Alcohol use No  . Drug use: No  . Sexual activity: No   Other Topics Concern  . Not on file   Social History Narrative  . No narrative on file    Family History  Problem Relation Age of Onset  . Depression Mother   . Stroke Father    Review of Systems See HPI  Patient Active Problem List   Diagnosis Date Noted  . Protein-calorie malnutrition, severe 11/21/2016  . Hyperkalemia 11/19/2016  . COPD with acute exacerbation (HCC) 11/19/2016  . COPD exacerbation (HCC) 11/01/2016  . Acute respiratory failure with hypoxia (HCC) 09/19/2016  . COPD GOLD IV 09/19/2016  . Essential hypertension 09/19/2016  . Hypercholesterolemia 09/19/2016    Allergies  Allergen Reactions  . Norflex [Orphenadrine] Hives    Prior to Admission medications   Medication Sig Start Date End Date  Taking? Authorizing Provider  albuterol (PROVENTIL HFA;VENTOLIN HFA) 108 (90 Base) MCG/ACT inhaler Inhale 2 puffs into the lungs every 4 (four) hours as needed for wheezing or shortness of breath. 11/11/16  Yes Bing NeighborsHarris, Aundra Espin S, FNP  albuterol (PROVENTIL) (2.5 MG/3ML) 0.083% nebulizer solution Take 3 mLs (2.5 mg total) by nebulization 4 (four) times daily. 11/22/16 12/22/16 Yes Choi, Jennifer Chahn-Yang, DO  budesonide (PULMICORT) 0.25 MG/2ML nebulizer solution Take 2 mLs (0.25 mg total) by nebulization 2 (two) times daily. 11/22/16 12/22/16 Yes Noralee Stainhoi, Jennifer Chahn-Yang, DO  famotidine (PEPCID) 20 MG tablet One at bedtime 11/17/16  Yes Nyoka CowdenWert, Michael B, MD  hydrOXYzine (ATARAX/VISTARIL) 10 MG tablet Take 1 tablet (10 mg total) by mouth 3 (three) times daily as needed for anxiety. 10/03/16  Yes Bing NeighborsHarris, Taj Arteaga S, FNP  ipratropium (ATROVENT) 0.02 % nebulizer solution Take 2.5 mLs (0.5 mg total) by nebulization 4 (four) times daily. 11/22/16 12/22/16 Yes Noralee Stainhoi, Jennifer Chahn-Yang, DO  mupirocin cream (BACTROBAN) 2 % Apply topically 2 (two) times daily. 11/03/16  Yes Emokpae, Ejiroghene E, MD  omeprazole (PRILOSEC) 40 MG capsule Take 1 capsule (40 mg total) by mouth daily. 11/17/16  Yes Nyoka CowdenWert, Michael B, MD  OXYGEN 2 lpm with sleep and occ during the day   Yes [provider]  predniSONE (DELTASONE) 10 MG tablet Take 4 tabs for 3 days,  then 3 tabs for 3 days, then 2 tabs for 3 days, then 1 tab for 3 days, then 1/2 tab for 4 days. 11/22/16  Yes Noralee Stain Chahn-Yang, DO  Tiotropium Bromide-Olodaterol (STIOLTO RESPIMAT) 2.5-2.5 MCG/ACT AERS Inhale 2 puffs into the lungs daily. 10/06/16  Yes Nyoka Cowden, MD    Past Medical, Surgical Family and Social History reviewed and updated.    Objective:   Today's Vitals   12/02/16 0928  BP: 123/79  Pulse: 100  Resp: 14  Temp: 98 F (36.7 C)  TempSrc: Oral  SpO2: 97%  Weight: 110 lb (49.9 kg)  Height: 5\' 4"  (1.626 m)    Wt Readings from Last 3  Encounters:  12/02/16 110 lb (49.9 kg)  11/22/16 110 lb 9.6 oz (50.2 kg)  11/17/16 107 lb (48.5 kg)    Physical Exam  Constitutional: He is oriented to person, place, and time. He appears cachectic. He has a sickly appearance.  HENT:  Head: Normocephalic and atraumatic.  Eyes: Pupils are equal, round, and reactive to light. Conjunctivae and EOM are normal.  Cardiovascular: Normal rate, regular rhythm, normal heart sounds and intact distal pulses.   Pulmonary/Chest: Effort normal. He has no wheezes.  Lymphadenopathy:    He has no cervical adenopathy.  Neurological: He is alert and oriented to person, place, and time.   Assessment & Plan:  1. COPD GOLD IV, Respiratory effort is normal today. Lungs sound remarkably clear without wheezing, although diminished lung sounds throughout.  -Complete entire prednisone taper that was prescribed at discharge from recent admission. Once completed resume prednisone 20 mg daily chronically. Keep follow-up with Dr. Sherene Sires as scheduled.  2. Need for influenza vaccination - Flu Vaccine QUAD 36+ mos IM  RTC: 8 weeks chronic condition follow-up    Godfrey Pick. Tiburcio Pea, MSN, FNP-C [facility]  [address] [place], [place] [address] [phone]

## 2016-12-23 ENCOUNTER — Other Ambulatory Visit: Payer: Self-pay | Admitting: Family Medicine

## 2016-12-23 MED FILL — IPRATROPIUM BR 0.02% SOLN: 0.02 | 18 days supply | Qty: 188 | Fill #1

## 2016-12-23 MED FILL — OMEPRAZOLE DR 40 MG CAPSULE: 40 | 30 days supply | Qty: 30 | Fill #1

## 2016-12-26 ENCOUNTER — Telehealth: Payer: Self-pay | Admitting: Internal Medicine

## 2016-12-26 MED ORDER — TIOTROPIUM BROMIDE-OLODATEROL 2.5-2.5 MCG/ACT IN AERS: 2.0000 | INHALATION_SPRAY | Freq: Every day | RESPIRATORY_TRACT | 11 refills | Status: DC

## 2016-12-26 MED FILL — STIOLTO RESPIMAT INHAL SPRY: 2.5-2.5 | 30 days supply | Qty: 4 | Fill #0

## 2016-12-26 NOTE — Telephone Encounter (Signed)
Left message for patient. RX has been called into pharmacy

## 2016-12-27 ENCOUNTER — Telehealth: Payer: Self-pay

## 2016-12-27 ENCOUNTER — Other Ambulatory Visit: Payer: Self-pay | Admitting: Family Medicine

## 2016-12-27 MED ORDER — OMEPRAZOLE 40 MG PO CPDR: 40.0000 mg | DELAYED_RELEASE_CAPSULE | Freq: Every day | ORAL | 11 refills | Status: DC

## 2016-12-27 MED ORDER — PREDNISONE 20 MG PO TABS: 20.0000 mg | ORAL_TABLET | Freq: Every day | ORAL | 1 refills | Status: DC

## 2016-12-27 MED ORDER — BUDESONIDE 0.25 MG/2ML IN SUSP: 0.2500 mg | Freq: Two times a day (BID) | RESPIRATORY_TRACT | 6 refills | Status: DC

## 2016-12-27 NOTE — Progress Notes (Signed)
Advise patient that requested medications were sent to [facility] on [facility]. The request for Protonix was not refilled as he is currently prescribed omeprazole 40 mg daily for GI related symptoms.

## 2016-12-28 ENCOUNTER — Other Ambulatory Visit: Payer: Self-pay | Admitting: Family Medicine

## 2016-12-28 ENCOUNTER — Other Ambulatory Visit: Payer: Self-pay

## 2016-12-28 MED FILL — PROVENTIL HFA 108 (90 BASE): 108 (90 BAS | 25 days supply | Qty: 7 | Fill #0

## 2016-12-28 MED FILL — AMLODIPINE BESYLATE 10 MG T: 10 | 30 days supply | Qty: 30 | Fill #1

## 2016-12-28 NOTE — Progress Notes (Signed)
Patient notified

## 2016-12-29 ENCOUNTER — Ambulatory Visit: Payer: Medicaid Other | Admitting: Internal Medicine

## 2016-12-29 MED ORDER — BUDESONIDE 0.25 MG/2ML IN SUSP: 0.2500 mg | Freq: Two times a day (BID) | RESPIRATORY_TRACT | 2 refills | Status: DC

## 2016-12-29 MED ORDER — OMEPRAZOLE 40 MG PO CPDR: 40.0000 mg | DELAYED_RELEASE_CAPSULE | Freq: Every day | ORAL | 11 refills | Status: DC

## 2016-12-29 MED ORDER — PREDNISONE 20 MG PO TABS: 20.0000 mg | ORAL_TABLET | Freq: Every day | ORAL | 2 refills | Status: DC

## 2016-12-29 MED FILL — ?PREDNISONE 20MG TABLET: 20 | 30 days supply | Qty: 30 | Fill #0

## 2016-12-29 NOTE — Telephone Encounter (Signed)
Requested medications sent to [facility].

## 2017-01-01 ENCOUNTER — Emergency Department (HOSPITAL_COMMUNITY)
Admission: EM | Admit: 2017-01-01 | Discharge: 2017-01-01 | Disposition: A | Discharge status: Home / Self Care | Payer: Medicaid Other | Attending: Emergency Medicine | Admitting: Emergency Medicine

## 2017-01-01 ENCOUNTER — Encounter (HOSPITAL_COMMUNITY): Payer: Self-pay

## 2017-01-01 ENCOUNTER — Emergency Department (HOSPITAL_COMMUNITY): Payer: Medicaid Other

## 2017-01-01 DIAGNOSIS — R0602 Shortness of breath: Secondary | ICD-10-CM | POA: Diagnosis present

## 2017-01-01 DIAGNOSIS — J441 Chronic obstructive pulmonary disease with (acute) exacerbation: Secondary | ICD-10-CM | POA: Diagnosis not present

## 2017-01-01 DIAGNOSIS — J45909 Unspecified asthma, uncomplicated: Secondary | ICD-10-CM | POA: Insufficient documentation

## 2017-01-01 DIAGNOSIS — Z87891 Personal history of nicotine dependence: Secondary | ICD-10-CM | POA: Insufficient documentation

## 2017-01-01 DIAGNOSIS — Z79899 Other long term (current) drug therapy: Secondary | ICD-10-CM | POA: Diagnosis not present

## 2017-01-01 DIAGNOSIS — I10 Essential (primary) hypertension: Secondary | ICD-10-CM | POA: Diagnosis not present

## 2017-01-01 LAB — CBC
HEMATOCRIT: 39.3 % (ref 39.0–52.0)
HEMOGLOBIN: 13 g/dL (ref 13.0–17.0)
MCH: 24.5 pg — ABNORMAL LOW (ref 26.0–34.0)
MCHC: 33.1 g/dL (ref 30.0–36.0)
MCV: 74.2 fL — AB (ref 78.0–100.0)
Platelets: 262 10*3/uL (ref 150–400)
RBC: 5.3 MIL/uL (ref 4.22–5.81)
RDW: 15.6 % — ABNORMAL HIGH (ref 11.5–15.5)
WBC: 12.8 10*3/uL — AB (ref 4.0–10.5)

## 2017-01-01 LAB — BASIC METABOLIC PANEL
ANION GAP: 10 (ref 5–15)
BUN: 10 mg/dL (ref 6–20)
CHLORIDE: 102 mmol/L (ref 101–111)
CO2: 28 mmol/L (ref 22–32)
Calcium: 9.7 mg/dL (ref 8.9–10.3)
Creatinine, Ser: 0.67 mg/dL (ref 0.61–1.24)
GFR calc Af Amer: >60 mL/min (ref >60)
GLUCOSE: 113 mg/dL — AB (ref 65–99)
POTASSIUM: 4.4 mmol/L (ref 3.5–5.1)
SODIUM: 140 mmol/L (ref 135–145)

## 2017-01-01 MED ORDER — ALBUTEROL SULFATE (2.5 MG/3ML) 0.083% IN NEBU: 5.0000 mg | INHALATION_SOLUTION | Freq: Once | RESPIRATORY_TRACT | Status: DC

## 2017-01-01 MED ORDER — ALBUTEROL SULFATE (2.5 MG/3ML) 0.083% IN NEBU
5.0000 mg | INHALATION_SOLUTION | Freq: Once | RESPIRATORY_TRACT | Status: AC
  Administered 2017-01-01: 5 mg via RESPIRATORY_TRACT
  Filled 2017-01-01: qty 6

## 2017-01-01 MED ORDER — AZITHROMYCIN 250 MG PO TABS: ORAL_TABLET | ORAL | 0 refills | Status: DC

## 2017-01-01 MED ORDER — ALBUTEROL SULFATE HFA 108 (90 BASE) MCG/ACT IN AERS
2.0000 | INHALATION_SPRAY | Freq: Once | RESPIRATORY_TRACT | Status: AC
  Administered 2017-01-01: 2 via RESPIRATORY_TRACT
  Filled 2017-01-01: qty 6.7

## 2017-01-01 MED ORDER — ALBUTEROL (5 MG/ML) CONTINUOUS INHALATION SOLN
10.0000 mg/h | INHALATION_SOLUTION | RESPIRATORY_TRACT | Status: DC
  Administered 2017-01-01: 10 mg/h via RESPIRATORY_TRACT
  Filled 2017-01-01: qty 20

## 2017-01-01 MED ORDER — IPRATROPIUM-ALBUTEROL 0.5-2.5 (3) MG/3ML IN SOLN
3.0000 mL | Freq: Once | RESPIRATORY_TRACT | Status: AC
  Administered 2017-01-01: 3 mL via RESPIRATORY_TRACT

## 2017-01-01 MED ORDER — IPRATROPIUM BROMIDE 0.02 % IN SOLN
0.5000 mg | Freq: Once | RESPIRATORY_TRACT | Status: AC
  Administered 2017-01-01: 0.5 mg via RESPIRATORY_TRACT
  Filled 2017-01-01: qty 2.5

## 2017-01-01 MED ORDER — IPRATROPIUM BROMIDE 0.02 % IN SOLN: 0.5000 mg | Freq: Four times a day (QID) | RESPIRATORY_TRACT | 0 refills | Status: DC

## 2017-01-01 NOTE — ED Triage Notes (Addendum)
Patient complaining of copd. Patient was brought by EMS. Patient having sob of breathe. Patient ran out of his medications today. Missing todays dose.

## 2017-01-01 NOTE — Discharge Instructions (Signed)
Continue to stay well-hydrated.  Use Mucinex for cough suppression/expectoration of mucus. Use over the counter antihistamines such as zyrtec, claritin, or allegra to decrease symptoms and frequency of asthma attacks.  Use albuterol inhaler and home nebulizers as directed, as needed for cough/chest congestion/wheezing/shortness of breath/etc.  Use all home medications as directed.  Take home prednisone as directed, starting tomorrow since you received today's dose in the ER today.  Take antibiotic as directed until completed.  Follow-up with your primary care doctor in 3-5 days for recheck of ongoing symptoms.  Return to emergency department for emergent changing or worsening of symptoms.

## 2017-01-01 NOTE — ED Notes (Signed)
Pt was able to ambulate in the hallway without dropping is O2 saturation.  Pt states he feels much better than when he came in.  Mercedes PA made aware.

## 2017-01-01 NOTE — ED Notes (Signed)
Megan with resp called, will be down soon

## 2017-01-01 NOTE — ED Triage Notes (Addendum)
Pt states being out of every prescription medication except albuterol 0.083% neb sulotion- was unable to pick up this past week because he states he was waiting on his check to pay for them. Pt states he was given a breathing tx and solumedrol IV with EMS

## 2017-01-01 NOTE — ED Provider Notes (Signed)
[facility]-EMERGENCY DEPT Provider Note   CSN: [id number] Arrival date & time: 01/01/17  1114     History   Chief Complaint Chief Complaint  Patient presents with  . Shortness of Breath    HPI Matthew Morrison is a 61 y.o. male with a PMHx of COPD, HTN, and HLD, who presents to the ED with complaints of COPD exacerbation that began this morning. Patient states that he has been doing well since leaving the hospital last month, his PCP has had him on daily prednisone 20 mg which seems to keep him out of trouble. He states that on Friday he ran out of his Pulmicort, Proventil inhaler, atrovent, and prednisone, so this morning he only had his albuterol nebulizer solution to use which was fairly ineffective. He complains of shortness of breath that worsens with exertion, but has improved after receiving 1 DuoNeb +  IV solumedrol with EMS, and 1 duoneb on arrival here. He reports associated wheezing and some nasal congestion over the last few days. He wonders whether he could be having some seasonal allergies contributing to his symptoms. Chart review reveals he was admitted 09/19/16, 11/01/16, and then 11/19/16 for COPD exacerbations, follows with Dr. Sherene Sires of pulmonology; last week called in asking for refills of his medications; states he hasn't yet gotten them from the pharmacy.   He denies any change in his cough or sputum production, rhinorrhea, sore throat, ear pain or drainage, hemoptysis, fevers, chills, CP, LE swelling, recent travel/surgery/immobilization, personal/family history of DVT/PE, abd pain, N/V/D/C, hematuria, dysuria, myalgias, arthralgias, numbness, tingling, focal weakness, or any other complaints at this time. He was a former smoker, but quit several years ago.    The history is provided by the patient and medical records. No language interpreter was used.  Shortness of Breath  This is a recurrent problem. The average episode lasts 6 hours. The problem occurs frequently.The current  episode started 6 to 12 hours ago. The problem has not changed since onset.Associated symptoms include wheezing. Pertinent negatives include no fever, no rhinorrhea, no sore throat, no ear pain, no cough, no hemoptysis, no chest pain, no vomiting, no abdominal pain and no leg swelling. Precipitated by: running out of meds. He has tried ipratropium inhalers and beta-agonist inhalers for the symptoms. The treatment provided moderate relief. He has had prior hospitalizations. He has had prior ED visits. Associated medical issues include COPD. Associated medical issues do not include PE or DVT.    Past Medical History:  Diagnosis Date  . Asthma   . Emphysema lung (HCC)   . Hypertension     Patient Active Problem List   Diagnosis Date Noted  . Protein-calorie malnutrition, severe 11/21/2016  . Hyperkalemia 11/19/2016  . COPD with acute exacerbation (HCC) 11/19/2016  . COPD exacerbation (HCC) 11/01/2016  . Acute respiratory failure with hypoxia (HCC) 09/19/2016  . COPD GOLD IV 09/19/2016  . Essential hypertension 09/19/2016  . Hypercholesterolemia 09/19/2016    Past Surgical History:  Procedure Laterality Date  . HERNIA REPAIR         Home Medications    Prior to Admission medications   Medication Sig Start Date End Date Taking? Authorizing Provider  albuterol (PROVENTIL) (2.5 MG/3ML) 0.083% nebulizer solution Take 3 mLs (2.5 mg total) by nebulization 4 (four) times daily. 11/22/16 12/22/16  Noralee Stain Chahn-Yang, DO  budesonide (PULMICORT) 0.25 MG/2ML nebulizer solution Take 2 mLs (0.25 mg total) by nebulization 2 (two) times daily. 12/29/16 07/27/17  Bing Neighbors, FNP  famotidine (PEPCID)  20 MG tablet One at bedtime 11/17/16   Nyoka Cowden, MD  hydrOXYzine (ATARAX/VISTARIL) 10 MG tablet TAKE 1 TABLET BY MOUTH 3 TIMES DAILY AS NEEDED FOR ANXIETY. 12/23/16   Bing Neighbors, FNP  ipratropium (ATROVENT) 0.02 % nebulizer solution Take 2.5 mLs (0.5 mg total) by nebulization 4  (four) times daily. 11/22/16 12/22/16  Noralee Stain Chahn-Yang, DO  mupirocin cream (BACTROBAN) 2 % Apply topically 2 (two) times daily. 11/03/16   Emokpae, Ejiroghene E, MD  omeprazole (PRILOSEC) 40 MG capsule Take 1 capsule (40 mg total) by mouth daily. 12/29/16   Bing Neighbors, FNP  OXYGEN 2 lpm with sleep and occ during the day    [provider]  predniSONE (DELTASONE) 20 MG tablet Take 1 tablet (20 mg total) by mouth daily with breakfast. 12/29/16   Bing Neighbors, FNP  PROVENTIL HFA 108 [phone] Base) MCG/ACT inhaler INHALE 2 PUFFS INTO THE LUNGS EVERY 6 HOURS AS NEEDED FOR WHEEZING OR SHORTNESS OF BREATH 12/28/16   Bing Neighbors, FNP  Tiotropium Bromide-Olodaterol (STIOLTO RESPIMAT) 2.5-2.5 MCG/ACT AERS Inhale 2 puffs into the lungs daily. 12/26/16   Nyoka Cowden, MD    Family History Family History  Problem Relation Age of Onset  . Depression Mother   . Stroke Father     Social History Social History  Substance Use Topics  . Smoking status: Former Smoker    Packs/day: 1.00    Years: 40.00    Types: Cigarettes    Quit date: 09/06/2016  . Smokeless tobacco: Never Used  . Alcohol use No     Allergies   Norflex [orphenadrine]   Review of Systems Review of Systems  Constitutional: Negative for chills and fever.  HENT: Positive for congestion. Negative for ear discharge, ear pain, rhinorrhea and sore throat.   Respiratory: Positive for shortness of breath and wheezing. Negative for cough and hemoptysis.   Cardiovascular: Negative for chest pain and leg swelling.  Gastrointestinal: Negative for abdominal pain, constipation, diarrhea, nausea and vomiting.  Genitourinary: Negative for dysuria and hematuria.  Musculoskeletal: Negative for arthralgias and myalgias.  Skin: Negative for color change.  Allergic/Immunologic: Negative for immunocompromised state.  Neurological: Negative for weakness and numbness.  Psychiatric/Behavioral: Negative for confusion.    All other systems reviewed and are negative for acute change except as noted in the HPI.    Physical Exam Updated Vital Signs BP (!) 148/101 (BP Location: Left Arm) Comment: Pt ouf of BP meds  Pulse 98   Temp 98.6 F (37 C) (Oral)   Resp 18   Ht  (1.626 m)   Wt 49.9 kg (110 lb)   SpO2 95%   BMI 18.88 kg/m   Physical Exam  Constitutional: He is oriented to person, place, and time. Vital signs are normal. He appears well-developed and well-nourished.  Non-toxic appearance. No distress.  Afebrile, nontoxic, NAD  HENT:  Head: Normocephalic and atraumatic.  Mouth/Throat: Oropharynx is clear and moist and mucous membranes are normal.  Eyes: Conjunctivae and EOM are normal. Right eye exhibits no discharge. Left eye exhibits no discharge.  Neck: Normal range of motion. Neck supple.  Cardiovascular: Normal rate, regular rhythm, normal heart sounds and intact distal pulses.  Exam reveals no gallop and no friction rub.   No murmur heard. RRR, nl s1/s2, no m/r/g, distal pulses intact, no pedal edema   Pulmonary/Chest: Effort normal. No respiratory distress. He has no decreased breath sounds. He has wheezes. He has no rhonchi. He  has no rales.  Faint expiratory wheezing in all fields, prolonged expiratory phase, no rhonchi/rales, no hypoxia or increased WOB, speaking in full sentences, SpO2 95% on RA  Abdominal: Soft. Normal appearance and bowel sounds are normal. He exhibits no distension. There is no tenderness. There is no rigidity, no rebound, no guarding, no CVA tenderness, no tenderness at McBurney's point and negative Murphy's sign.  Musculoskeletal: Normal range of motion.  MAE x4 Strength and sensation grossly intact in all extremities Distal pulses intact No pedal edema, neg homan's bilaterally   Neurological: He is alert and oriented to person, place, and time. He has normal strength. No sensory deficit.  Skin: Skin is warm, dry and intact. No rash noted.  Psychiatric: He  has a normal mood and affect.  Nursing note and vitals reviewed.    ED Treatments / Results  Labs (all labs ordered are listed, but only abnormal results are displayed) Labs Reviewed  BASIC METABOLIC PANEL - Abnormal; Notable for the following:       Result Value   Glucose, Bld 113 (*)    All other components within normal limits  CBC - Abnormal; Notable for the following:    WBC 12.8 (*)    MCV 74.2 (*)    MCH 24.5 (*)    RDW 15.6 (*)    All other components within normal limits    EKG  EKG Interpretation  Date/Time:  Sunday January 01 2017 12:05:00 EDT Ventricular Rate:  84 PR Interval:    QRS Duration: 75 QT Interval:  361 QTC Calculation: 427 R Axis:   79 Text Interpretation:  Sinus rhythm Biatrial enlargement Nonspecific T abnrm, anterolateral leads No significant change since last tracing Confirmed by Gwyneth Sprout ([id number]) on 01/01/2017 2:27:07 PM       Radiology Dg Chest 2 View  Result Date: 01/01/2017 CLINICAL DATA:  Shortness of breath. EXAM: CHEST  2 VIEW COMPARISON:  Radiographs of November 19, 2016. FINDINGS: The heart size and mediastinal contours are within normal limits. Both lungs are clear. No pneumothorax or pleural effusion is noted. Hyperexpansion of the lungs is noted consistent with chronic obstructive pulmonary disease. The visualized skeletal structures are unremarkable. IMPRESSION: No active cardiopulmonary disease. Findings consistent with chronic obstructive pulmonary disease. Electronically Signed   By: Lupita Raider, M.D.   On: 01/01/2017 14:25    Procedures Procedures (including critical care time)  CRITICAL CARE- multiple nebulizer treatments Performed by: Rhona Raider   Total critical care time: 45 minutes  Critical care time was exclusive of separately billable procedures and treating other patients.  Critical care was necessary to treat or prevent imminent or life-threatening deterioration.  Critical care was time spent  personally by me on the following activities: development of treatment plan with patient and/or surrogate as well as nursing, discussions with consultants, evaluation of patient's response to treatment, examination of patient, obtaining history from patient or surrogate, ordering and performing treatments and interventions, ordering and review of laboratory studies, ordering and review of radiographic studies, pulse oximetry and re-evaluation of patient's condition.    Medications Ordered in ED Medications  albuterol (PROVENTIL,VENTOLIN) solution continuous neb (10 mg/hr Nebulization New Bag/Given 01/01/17 1536)  albuterol (PROVENTIL HFA;VENTOLIN HFA) 108 (90 Base) MCG/ACT inhaler 2 puff (not administered)  ipratropium-albuterol (DUONEB) 0.5-2.5 (3) MG/3ML nebulizer solution 3 mL (3 mLs Nebulization Given 01/01/17 1223)  albuterol (PROVENTIL) (2.5 MG/3ML) 0.083% nebulizer solution 5 mg (5 mg Nebulization Given 01/01/17 1433)  ipratropium (ATROVENT) nebulizer solution 0.5 mg (0.5  mg Nebulization Given 01/01/17 1433)     Initial Impression / Assessment and Plan / ED Course  I have reviewed the triage vital signs and the nursing notes.  Pertinent labs & imaging results that were available during my care of the patient were reviewed by me and considered in my medical decision making (see chart for details).     61 y.o. male here with COPD exacerbation that started acting up today. States he ran out of his meds 2 days ago (proventil inhaler, prednisone  that he takes daily, atrovent, and pulmicort). Used albuterol neb at home and it didn't help much. Feels similar to prior exacerbations. C/o SOB and wheezing. On exam, diffuse expiratory wheezing, prolonged expiratory phase, no rhonchi/rales, no LE swelling, no tachycardia, no increased WOB. Doubt PE, likely just COPD exacerbation. EKG without acute ischemic findings. Labs done in triage show very mild leukocytosis 12.8, otherwise unremarkable, and  BMP WNL. CXR pending. Will give another duoneb, if that doesn't work then will do continuous neb. Will reassess shortly  3:22 PM CXR negative for acute findings. Still having faint wheezing, although slightly improved; will give continuous neb and hopefully this will resolve it. Will reassess shortly.   4:27 PM Pt feeling much better and lung sounds greatly improved after continous neb. Pt ambulatory without desats, stayed 96-99% during entire ambulation. Will send home with inhaler, already has refill waiting at pharmacy (and has albuterol neb solution at home to use as well); will also provide refill of atrovent since in the system it looks like this may not have been refilled when he called his PCP for refills on his other meds (pulmicort, prednisone, and albuterol inhaler all look like they're waiting on him at the pharmacy); advised use of home meds and prednisone starting tomorrow, advised use of daily antihistamine, and other OTC remedies for symptomatic relief. Will also start on zpak for his COPD exacerbation. F/up with PCP in 3-5 days for recheck of symptoms. I explained the diagnosis and have given explicit precautions to return to the ER including for any other new or worsening symptoms. The patient understands and accepts the medical plan as it's been dictated and I have answered their questions. Discharge instructions concerning home care and prescriptions have been given. The patient is STABLE and is discharged to home in good condition.    Final Clinical Impressions(s) / ED Diagnoses   Final diagnoses:  COPD exacerbation (HCC)  SOB (shortness of breath)    New Prescriptions New Prescriptions   AZITHROMYCIN (ZITHROMAX Z-PAK) 250 MG TABLET    2 po day one, then 1 daily x 4 days   IPRATROPIUM (ATROVENT) 0.02 % NEBULIZER SOLUTION    Take 2.5 mLs (0.5 mg total) by nebulization 4 (four) times daily.     [address], [place], [place] 01/01/17 1627    Gwyneth Sprout, MD 01/04/17  2051

## 2017-01-01 NOTE — ED Notes (Signed)
Bed: WTR9 Expected date:  Expected time:  Means of arrival:  Comments: 

## 2017-01-01 NOTE — ED Notes (Signed)
Respiratory made aware of continuous neb treatment.

## 2017-01-03 ENCOUNTER — Ambulatory Visit: Payer: Medicaid Other | Admitting: Family Medicine

## 2017-01-07 ENCOUNTER — Encounter (HOSPITAL_COMMUNITY): Payer: Self-pay | Admitting: Emergency Medicine

## 2017-01-07 ENCOUNTER — Other Ambulatory Visit: Payer: Self-pay

## 2017-01-07 ENCOUNTER — Emergency Department (HOSPITAL_COMMUNITY): Payer: Medicaid Other

## 2017-01-07 DIAGNOSIS — Z87891 Personal history of nicotine dependence: Secondary | ICD-10-CM | POA: Diagnosis not present

## 2017-01-07 DIAGNOSIS — Z79899 Other long term (current) drug therapy: Secondary | ICD-10-CM | POA: Diagnosis not present

## 2017-01-07 DIAGNOSIS — J441 Chronic obstructive pulmonary disease with (acute) exacerbation: Secondary | ICD-10-CM | POA: Insufficient documentation

## 2017-01-07 DIAGNOSIS — J45909 Unspecified asthma, uncomplicated: Secondary | ICD-10-CM | POA: Diagnosis not present

## 2017-01-07 DIAGNOSIS — I10 Essential (primary) hypertension: Secondary | ICD-10-CM | POA: Insufficient documentation

## 2017-01-07 DIAGNOSIS — R0602 Shortness of breath: Secondary | ICD-10-CM | POA: Diagnosis present

## 2017-01-07 LAB — CBC
HCT: 39.2 % (ref 39.0–52.0)
Hemoglobin: 13.3 g/dL (ref 13.0–17.0)
MCH: 24.5 pg — AB (ref 26.0–34.0)
MCHC: 33.9 g/dL (ref 30.0–36.0)
MCV: 72.3 fL — ABNORMAL LOW (ref 78.0–100.0)
PLATELETS: 293 10*3/uL (ref 150–400)
RBC: 5.42 MIL/uL (ref 4.22–5.81)
RDW: 15 % (ref 11.5–15.5)
WBC: 9.4 10*3/uL (ref 4.0–10.5)

## 2017-01-07 LAB — BASIC METABOLIC PANEL
ANION GAP: 15 (ref 5–15)
BUN: 10 mg/dL (ref 6–20)
CHLORIDE: 91 mmol/L — AB (ref 101–111)
CO2: 29 mmol/L (ref 22–32)
Calcium: 9.2 mg/dL (ref 8.9–10.3)
Creatinine, Ser: 0.71 mg/dL (ref 0.61–1.24)
GFR calc Af Amer: >60 mL/min (ref >60)
GFR calc non Af Amer: >60 mL/min (ref >60)
Glucose, Bld: 94 mg/dL (ref 65–99)
POTASSIUM: 3.6 mmol/L (ref 3.5–5.1)
SODIUM: 135 mmol/L (ref 135–145)

## 2017-01-07 MED ORDER — ALBUTEROL SULFATE (2.5 MG/3ML) 0.083% IN NEBU
5.0000 mg | INHALATION_SOLUTION | Freq: Once | RESPIRATORY_TRACT | Status: AC
  Administered 2017-01-07: 5 mg via RESPIRATORY_TRACT
  Filled 2017-01-07: qty 6

## 2017-01-07 NOTE — ED Notes (Signed)
Pt had drawn for labs:  Blue Gold Lavender Lt green Dark green x2 

## 2017-01-07 NOTE — ED Triage Notes (Signed)
Pt reports having shortness of breath that started appx 3 hours ago. Wheezing present at time of triage. Pt reports prior hx of COPD.

## 2017-01-08 ENCOUNTER — Emergency Department (HOSPITAL_COMMUNITY)
Admission: EM | Admit: 2017-01-08 | Discharge: 2017-01-08 | Disposition: A | Discharge status: Home / Self Care | Payer: Medicaid Other | Attending: Emergency Medicine | Admitting: Emergency Medicine

## 2017-01-08 DIAGNOSIS — J441 Chronic obstructive pulmonary disease with (acute) exacerbation: Secondary | ICD-10-CM

## 2017-01-08 MED ORDER — PREDNISONE 10 MG PO TABS: 20.0000 mg | ORAL_TABLET | Freq: Two times a day (BID) | ORAL | 0 refills | Status: DC

## 2017-01-08 MED ORDER — METHYLPREDNISOLONE SODIUM SUCC 125 MG IJ SOLR
125.0000 mg | Freq: Once | INTRAMUSCULAR | Status: AC
  Administered 2017-01-08: 125 mg via INTRAVENOUS
  Filled 2017-01-08: qty 2

## 2017-01-08 MED ORDER — ALBUTEROL (5 MG/ML) CONTINUOUS INHALATION SOLN
10.0000 mg/h | INHALATION_SOLUTION | Freq: Once | RESPIRATORY_TRACT | Status: AC
  Administered 2017-01-08: 10 mg/h via RESPIRATORY_TRACT
  Filled 2017-01-08: qty 20

## 2017-01-08 NOTE — ED Notes (Signed)
Warm blanket givien.

## 2017-01-08 NOTE — ED Provider Notes (Signed)
[facility] Provider Note   CSN: [id number] Arrival date & time: 01/07/17  1905     History   Chief Complaint Chief Complaint  Patient presents with  . Shortness of Breath    HPI Matthew Morrison is a 61 y.o. male.  Patient is a 61 year old male with past history of COPD, hypertension presenting for evaluation of wheezing and difficulty breathing. This is worsened over the past several days. He has home oxygen and home nebulizer treatments which are not helping. He denies any fevers, chills, chest pain, or productive cough. He is a prior smoker, but gave this up several years ago.   The history is provided by the patient.  Shortness of Breath  This is a new problem. The average episode lasts 3 days. The problem occurs continuously.The problem has been gradually worsening. Associated symptoms include wheezing. Pertinent negatives include no fever, no cough, no sputum production, no hemoptysis and no chest pain. Treatments tried: Home nebs. The treatment provided no relief. Associated medical issues include COPD. Associated medical issues do not include asthma, PE, CAD or heart failure.    Past Medical History:  Diagnosis Date  . Asthma   . Emphysema lung (HCC)   . Hypertension     Patient Active Problem List   Diagnosis Date Noted  . Protein-calorie malnutrition, severe 11/21/2016  . Hyperkalemia 11/19/2016  . COPD with acute exacerbation (HCC) 11/19/2016  . COPD exacerbation (HCC) 11/01/2016  . Acute respiratory failure with hypoxia (HCC) 09/19/2016  . COPD GOLD IV 09/19/2016  . Essential hypertension 09/19/2016  . Hypercholesterolemia 09/19/2016    Past Surgical History:  Procedure Laterality Date  . HERNIA REPAIR         Home Medications    Prior to Admission medications   Medication Sig Start Date End Date Taking? Authorizing Provider  albuterol (PROVENTIL) (2.5 MG/3ML) 0.083% nebulizer solution Take 3 mLs (2.5 mg total) by nebulization 4 (four) times  daily. 11/22/16 12/22/16  Noralee Stain Chahn-Yang, DO  azithromycin (ZITHROMAX Z-PAK) 250 MG tablet 2 po day one, then 1 daily x 4 days 01/01/17   Street, [place], PA-C  budesonide (PULMICORT) 0.25 MG/2ML nebulizer solution Take 2 mLs (0.25 mg total) by nebulization 2 (two) times daily. 12/29/16 07/27/17  Bing Neighbors, FNP  famotidine (PEPCID) 20 MG tablet One at bedtime 11/17/16   Nyoka Cowden, MD  hydrOXYzine (ATARAX/VISTARIL) 10 MG tablet TAKE 1 TABLET BY MOUTH 3 TIMES DAILY AS NEEDED FOR ANXIETY. 12/23/16   Bing Neighbors, FNP  ipratropium (ATROVENT) 0.02 % nebulizer solution Take 2.5 mLs (0.5 mg total) by nebulization 4 (four) times daily. 11/22/16 12/22/16  Noralee Stain Chahn-Yang, DO  ipratropium (ATROVENT) 0.02 % nebulizer solution Take 2.5 mLs (0.5 mg total) by nebulization 4 (four) times daily. 01/01/17   Street, [place], PA-C  mupirocin cream (BACTROBAN) 2 % Apply topically 2 (two) times daily. 11/03/16   Emokpae, Ejiroghene E, MD  omeprazole (PRILOSEC) 40 MG capsule Take 1 capsule (40 mg total) by mouth daily. 12/29/16   Bing Neighbors, FNP  OXYGEN 2 lpm with sleep and occ during the day    [provider]  predniSONE (DELTASONE) 20 MG tablet Take 1 tablet (20 mg total) by mouth daily with breakfast. 12/29/16   Bing Neighbors, FNP  PROVENTIL HFA 108 [phone] Base) MCG/ACT inhaler INHALE 2 PUFFS INTO THE LUNGS EVERY 6 HOURS AS NEEDED FOR WHEEZING OR SHORTNESS OF BREATH 12/28/16   Bing Neighbors, FNP  Tiotropium Bromide-Olodaterol (STIOLTO  RESPIMAT) 2.5-2.5 MCG/ACT AERS Inhale 2 puffs into the lungs daily. 12/26/16   Nyoka Cowden, MD    Family History Family History  Problem Relation Age of Onset  . Depression Mother   . Stroke Father     Social History Social History  Substance Use Topics  . Smoking status: Former Smoker    Packs/day: 1.00    Years: 40.00    Types: Cigarettes    Quit date: 09/06/2016  . Smokeless tobacco: Never Used  . Alcohol use No      Allergies   Norflex [orphenadrine]   Review of Systems Review of Systems  Constitutional: Negative for fever.  Respiratory: Positive for shortness of breath and wheezing. Negative for cough, hemoptysis and sputum production.   Cardiovascular: Negative for chest pain.  All other systems reviewed and are negative.    Physical Exam Updated Vital Signs BP (!) 116/92 (BP Location: Left Arm)   Pulse (!) 106   Temp 98 F (36.7 C) (Oral)   Resp 18   Ht  (1.626 m)   Wt 49.9 kg (110 lb)   SpO2 97%   BMI 18.88 kg/m   Physical Exam  Constitutional: He is oriented to person, place, and time. He appears well-developed and well-nourished. No distress.  HENT:  Head: Normocephalic and atraumatic.  Mouth/Throat: Oropharynx is clear and moist.  Neck: Normal range of motion. Neck supple.  Cardiovascular: Normal rate and regular rhythm.  Exam reveals no friction rub.   No murmur heard. Pulmonary/Chest: He is in respiratory distress. He has wheezes. He has no rales.  There are expiratory wheezes present. He is noted to be in mild respiratory distress.  Abdominal: Soft. Bowel sounds are normal. He exhibits no distension. There is no tenderness.  Musculoskeletal: Normal range of motion. He exhibits no edema.  Neurological: He is alert and oriented to person, place, and time. Coordination normal.  Skin: Skin is warm and dry. He is not diaphoretic.  Nursing note and vitals reviewed.    ED Treatments / Results  Labs (all labs ordered are listed, but only abnormal results are displayed) Labs Reviewed  BASIC METABOLIC PANEL - Abnormal; Notable for the following:       Result Value   Chloride 91 (*)    All other components within normal limits  CBC - Abnormal; Notable for the following:    MCV 72.3 (*)    MCH 24.5 (*)    All other components within normal limits    EKG  EKG Interpretation None       Radiology Dg Chest 2 View  Result Date: 01/07/2017 CLINICAL DATA:   Dyspnea EXAM: CHEST  2 VIEW COMPARISON:  01/01/2017 chest radiograph. FINDINGS: Stable cardiomediastinal silhouette with normal heart size. No pneumothorax. No pleural effusion. Emphysema and hyperinflated lungs. No pulmonary edema. No acute consolidative airspace disease. IMPRESSION: 1. No acute cardiopulmonary disease. 2. Emphysema and hyperinflated lungs, compatible with COPD. Electronically Signed   By: Delbert Phenix M.D.   On: 01/07/2017 20:12    Procedures Procedures (including critical care time)  Medications Ordered in ED Medications  methylPREDNISolone sodium succinate (SOLU-MEDROL) 125 mg/2 mL injection 125 mg (not administered)  albuterol (PROVENTIL,VENTOLIN) solution continuous neb (not administered)  albuterol (PROVENTIL) (2.5 MG/3ML) 0.083% nebulizer solution 5 mg (5 mg Nebulization Given 01/07/17 1946)     Initial Impression / Assessment and Plan / ED Course  I have reviewed the triage vital signs and the nursing notes.  Pertinent labs & imaging  results that were available during my care of the patient were reviewed by me and considered in my medical decision making (see chart for details).  Patient presenting with shortness of breath related to an exacerbation of his COPD. He was given IV steroids and continuous albuterol with good results. He will be discharged with Prednisone and continued use of his nebulizer.  Final Clinical Impressions(s) / ED Diagnoses   Final diagnoses:  None    New Prescriptions New Prescriptions   No medications on file     Geoffery Lyons, MD 01/08/17 0403

## 2017-01-08 NOTE — Discharge Instructions (Signed)
Prednisone as prescribed.  Continue use of your albuterol nebulizer every 4 hours as needed.  Return to the emergency department if your symptoms significantly worsen or change.

## 2017-01-16 ENCOUNTER — Telehealth: Payer: Self-pay

## 2017-01-16 ENCOUNTER — Other Ambulatory Visit: Payer: Self-pay

## 2017-01-16 ENCOUNTER — Ambulatory Visit: Payer: Medicaid Other | Admitting: Internal Medicine

## 2017-01-16 MED ORDER — TIOTROPIUM BROMIDE-OLODATEROL 2.5-2.5 MCG/ACT IN AERS: 2.0000 | INHALATION_SPRAY | Freq: Every day | RESPIRATORY_TRACT | 11 refills | Status: DC

## 2017-01-16 MED ORDER — HYDROXYZINE HCL 10 MG PO TABS: ORAL_TABLET | ORAL | 4 refills | Status: DC

## 2017-01-16 MED ORDER — FAMOTIDINE 20 MG PO TABS: 20.0000 mg | ORAL_TABLET | Freq: Every day | ORAL | 4 refills | Status: DC

## 2017-01-16 MED ORDER — IPRATROPIUM BROMIDE 0.02 % IN SOLN: 0.5000 mg | Freq: Four times a day (QID) | RESPIRATORY_TRACT | 4 refills | Status: DC

## 2017-01-17 NOTE — Telephone Encounter (Signed)
Spoke with patient and medication have been sent. Patient is aware that the Azithromycin can not be refilled

## 2017-01-30 ENCOUNTER — Ambulatory Visit: Payer: Self-pay | Admitting: Family Medicine

## 2017-02-01 ENCOUNTER — Telehealth: Payer: Self-pay

## 2017-02-01 MED ORDER — ALBUTEROL SULFATE (2.5 MG/3ML) 0.083% IN NEBU: 2.5000 mg | INHALATION_SOLUTION | Freq: Four times a day (QID) | RESPIRATORY_TRACT | 0 refills | Status: DC

## 2017-02-01 MED ORDER — AMLODIPINE BESYLATE 10 MG PO TABS: 10.0000 mg | ORAL_TABLET | Freq: Every day | ORAL | 2 refills | Status: DC

## 2017-02-01 MED ORDER — TIOTROPIUM BROMIDE-OLODATEROL 2.5-2.5 MCG/ACT IN AERS: 2.0000 | INHALATION_SPRAY | Freq: Every day | RESPIRATORY_TRACT | 11 refills | Status: DC

## 2017-02-01 MED ORDER — OMEPRAZOLE 40 MG PO CPDR: 40.0000 mg | DELAYED_RELEASE_CAPSULE | Freq: Every day | ORAL | 11 refills | Status: DC

## 2017-02-01 NOTE — Telephone Encounter (Deleted)
Please contact patient to inquire about his medication questions

## 2017-02-01 NOTE — Telephone Encounter (Addendum)
Please contact patient to inquire about his questions.  I did not refill his amlodipine as his blood pressure have been really low during past visit.   Thanks,  Godfrey PickKimberly S. Tiburcio PeaHarris, MSN, FNP-C [facility]  [address] [place], [place] [address] [phone]

## 2017-02-01 NOTE — Telephone Encounter (Signed)
Patient notified that script were sent in accept the amlodipine because his blood pressure was low

## 2017-02-02 NOTE — Telephone Encounter (Signed)
Medication sent.

## 2017-02-07 ENCOUNTER — Ambulatory Visit: Payer: Medicaid Other | Admitting: Family Medicine

## 2017-02-07 ENCOUNTER — Encounter: Payer: Self-pay | Admitting: Family Medicine

## 2017-02-07 VITALS — BP 129/90 | HR 90 | Temp 98.5°F | Resp 12 | Ht 64.0 in | Wt 115.0 lb

## 2017-02-07 DIAGNOSIS — E43 Unspecified severe protein-calorie malnutrition: Secondary | ICD-10-CM

## 2017-02-07 DIAGNOSIS — I10 Essential (primary) hypertension: Secondary | ICD-10-CM | POA: Diagnosis not present

## 2017-02-07 DIAGNOSIS — J449 Chronic obstructive pulmonary disease, unspecified: Secondary | ICD-10-CM

## 2017-02-07 MED ORDER — PREDNISONE 20 MG PO TABS: 20.0000 mg | ORAL_TABLET | Freq: Every day | ORAL | 3 refills | Status: DC

## 2017-02-07 NOTE — Progress Notes (Signed)
Patient ID: Matthew Morrison, male    DOB: April 12, 1955, 61 y.o.   MRN: [id number]  PCP: Bing Neighbors, FNP  Chief Complaint  Patient presents with  . Follow-up    3 MONTH    Subjective:  HPI Matthew Morrison is a 61 y.o. male presents for 3 month follow-up evaluation of COPD and protein deficient malnutrition. Matthew Morrison reports that he has been doing well overall.  He was last seen in the emergency department on 01/08/2017 and  treated for an acute COPD exacerbation.  He continues to use his oxygen daily at bedtime.  He is receiving 2 L via nasal cannula.  He also is working on weight gain. He is being followed by Dr. Darleene Cleaver, monthly through a home-based program in which his weight and chronic conditions are monitored. He is currently drinking 3-4 Ensure drinks daily and has gained 5 lbs since 01/07/2017. Current Body mass index is 19.74 kg/m. He reports a good appetite and endorses eating 4-5 small meals per day. Since he was last seen in the ED and treated with a prednisone taper, he has not resume his chronic prednisone 20 mg daily, therefore needs a refill today. He denies any worsening of dyspnea, cough, chest tightness, or dizziness. Social History   Socioeconomic History  . Marital status: Single    Spouse name: Not on file  . Number of children: Not on file  . Years of education: Not on file  . Highest education level: Not on file  Social Needs  . Financial resource strain: Not on file  . Food insecurity - worry: Not on file  . Food insecurity - inability: Not on file  . Transportation needs - medical: Not on file  . Transportation needs - non-medical: Not on file  Occupational History  . Not on file  Tobacco Use  . Smoking status: Former Smoker    Packs/day: 1.00    Years: 40.00    Pack years: 40.00    Types: Cigarettes    Last attempt to quit: 09/06/2016    Years since quitting: 0.4  . Smokeless tobacco: Never Used  Substance and Sexual Activity  . Alcohol use: No  . Drug use: No   . Sexual activity: No  Other Topics Concern  . Not on file  Social History Narrative  . Not on file    Family History  Problem Relation Age of Onset  . Depression Mother   . Stroke Father    Review of Systems See HPI  Patient Active Problem List   Diagnosis Date Noted  . Protein-calorie malnutrition, severe 11/21/2016  . Hyperkalemia 11/19/2016  . COPD with acute exacerbation (HCC) 11/19/2016  . COPD exacerbation (HCC) 11/01/2016  . Acute respiratory failure with hypoxia (HCC) 09/19/2016  . COPD GOLD IV 09/19/2016  . Essential hypertension 09/19/2016  . Hypercholesterolemia 09/19/2016    Allergies  Allergen Reactions  . Norflex [Orphenadrine] Hives    Prior to Admission medications   Medication Sig Start Date End Date Taking? Authorizing Provider  albuterol (PROVENTIL) (2.5 MG/3ML) 0.083% nebulizer solution Take 3 mLs (2.5 mg total) by nebulization 4 (four) times daily. 02/01/17 03/03/17 Yes Bing Neighbors, FNP  budesonide (PULMICORT) 0.25 MG/2ML nebulizer solution Take 2 mLs (0.25 mg total) by nebulization 2 (two) times daily. 12/29/16 07/27/17 Yes Bing Neighbors, FNP  famotidine (PEPCID) 20 MG tablet Take 1 tablet (20 mg total) by mouth at bedtime. 01/16/17  Yes Bing Neighbors, FNP  hydrOXYzine (ATARAX/VISTARIL) 10 MG tablet  TAKE 1 TABLET BY MOUTH 3 TIMES DAILY AS NEEDED FOR ANXIETY. 01/16/17  Yes Bing NeighborsHarris, Wane Mollett S, FNP  ipratropium (ATROVENT) 0.02 % nebulizer solution Take 2.5 mLs (0.5 mg total) by nebulization 4 (four) times daily. 01/16/17  Yes Bing NeighborsHarris, Rease Wence S, FNP  mupirocin cream (BACTROBAN) 2 % Apply topically 2 (two) times daily. Patient taking differently: Apply 1 application topically 2 (two) times daily as needed (wound care).  11/03/16  Yes Emokpae, Ejiroghene E, MD  omeprazole (PRILOSEC) 40 MG capsule Take 1 capsule (40 mg total) by mouth daily. 02/01/17  Yes Bing NeighborsHarris, Dyshon Philbin S, FNP  OXYGEN 2 lpm with sleep and occ during the day   Yes [provider]  Tiotropium Bromide-Olodaterol (STIOLTO RESPIMAT) 2.5-2.5 MCG/ACT AERS Inhale 2 puffs into the lungs daily. 02/01/17  Yes Bing NeighborsHarris, Vanilla Heatherington S, FNP  predniSONE (DELTASONE) 10 MG tablet Take 2 tablets (20 mg total) by mouth 2 (two) times daily with a meal. Patient not taking: Reported on 02/07/2017 01/08/17   Matthew Morrison, Douglas, MD    Past Medical, Surgical Family and Social History reviewed and updated.    Objective:   Today's Vitals   02/07/17 0950  BP: 129/90  Pulse: 90  Resp: 12  Temp: 98.5 F (36.9 C)  TempSrc: Oral  SpO2: 96%  Weight: 115 lb (52.2 kg)  Height: 5\' 4"  (1.626 m)    Wt Readings from Last 3 Encounters:  02/07/17 115 lb (52.2 kg)  01/07/17 110 lb (49.9 kg)  01/01/17 110 lb (49.9 kg)   Physical Exam Physical Exam: Constitutional: Patient appears well-developed and well-nourished. No distress. HENT: Normocephalic, atraumatic, External right and left ear normal. Oropharynx is clear and moist.  Eyes: Conjunctivae and EOM are normal. PERRLA, no scleral icterus. Neck: Normal ROM. Neck supple. No JVD. No tracheal deviation. No thyromegaly. CVS: RRR, S1/S2 +, no murmurs, no gallops, no carotid bruit.  Pulmonary: Effort normal. Breath sounds diminished throughout. Abdominal: Soft. BS +, no distension, tenderness, rebound or guarding.  Musculoskeletal: Normal range of motion. No edema and no tenderness.  Lymphadenopathy: No cervical adenopathy. Neuro: Alert. Normal muscle tone coordination.  Skin: Skin is warm and dry. No rash noted. Not diaphoretic. No erythema. No pallor. Psychiatric: Normal mood and affect. Behavior, judgment, thought content normal.   Assessment & Plan:  1. COPD GOLD IV, symptoms remain well controlled.  He will continue prednisone 20 mg once daily, continue nebulizer treatments 4 times daily, Respimat, and supplemental oxygen at 2 L via nasal cannula. Continue follow-up with pulmonology. Return for care if symptoms are not relieved with  medication therapy.  2. Protein-calorie malnutrition, severe, continue Ensure nutritional supplements, continue 4-5 high-protein high-calorie meals per day.  Goal weight is 120 pounds.   3. Essential hypertension, remains controlled today in spite of holding antihypertensive medications.  Will continue to hold antihypertensive medications as patient has become severely hypotensive previously. Continue to monitor BP if readings exceed 140/80 consistently, will consider resuming amlodipine.   RTC: 3 months for chronic condition management.   Godfrey PickKimberly S. Tiburcio PeaHarris, MSN, FNP-C The Patient Care [facility]  [address] [place], [place] [address] [phone]

## 2017-04-05 ENCOUNTER — Telehealth: Payer: Self-pay

## 2017-04-06 NOTE — Telephone Encounter (Signed)
Proventil Refill called into [facility]

## 2017-05-01 ENCOUNTER — Other Ambulatory Visit: Payer: Self-pay | Admitting: Family Medicine

## 2017-05-10 ENCOUNTER — Ambulatory Visit: Payer: Medicaid Other | Admitting: Family Medicine

## 2017-05-10 ENCOUNTER — Encounter: Payer: Self-pay | Admitting: Family Medicine

## 2017-05-10 ENCOUNTER — Telehealth: Payer: Self-pay | Admitting: Family Medicine

## 2017-05-10 ENCOUNTER — Ambulatory Visit (HOSPITAL_COMMUNITY)
Admission: RE | Admit: 2017-05-10 | Discharge: 2017-05-10 | Disposition: A | Discharge status: Home / Self Care | Payer: Medicaid Other | Source: Ambulatory Visit | Attending: Family Medicine | Admitting: Family Medicine

## 2017-05-10 VITALS — BP 128/90 | HR 92 | Temp 98.0°F | Resp 14 | Ht 64.0 in | Wt 125.0 lb

## 2017-05-10 DIAGNOSIS — I10 Essential (primary) hypertension: Secondary | ICD-10-CM | POA: Diagnosis not present

## 2017-05-10 DIAGNOSIS — E43 Unspecified severe protein-calorie malnutrition: Secondary | ICD-10-CM | POA: Diagnosis not present

## 2017-05-10 DIAGNOSIS — J449 Chronic obstructive pulmonary disease, unspecified: Secondary | ICD-10-CM

## 2017-05-10 DIAGNOSIS — R109 Unspecified abdominal pain: Secondary | ICD-10-CM

## 2017-05-10 LAB — POCT URINALYSIS DIP (DEVICE)
Bilirubin Urine: NEGATIVE
Glucose, UA: NEGATIVE mg/dL
HGB URINE DIPSTICK: NEGATIVE
Ketones, ur: NEGATIVE mg/dL
LEUKOCYTES UA: NEGATIVE
Nitrite: NEGATIVE
PH: 7 (ref 5.0–8.0)
PROTEIN: NEGATIVE mg/dL
SPECIFIC GRAVITY, URINE: 1.015 (ref 1.005–1.030)
Urobilinogen, UA: 0.2 mg/dL (ref 0.0–1.0)

## 2017-05-10 MED ORDER — POLYETHYLENE GLYCOL 3350 17 G PO PACK: 17.0000 g | PACK | Freq: Two times a day (BID) | ORAL | 0 refills | Status: DC

## 2017-05-10 MED ORDER — ACETAMINOPHEN-CODEINE #3 300-30 MG PO TABS: 1.0000 | ORAL_TABLET | ORAL | 0 refills | Status: DC | PRN

## 2017-05-10 NOTE — Progress Notes (Signed)
Patient ID: Matthew Morrison, male    DOB: January 14, 1956, 62 y.o.   MRN: [id number]  PCP: Bing Neighbors, FNP  Chief Complaint  Patient presents with  . Follow-up    3 month on chronic condition  . Flank Pain    right side     Subjective:  HPI Matthew Morrison is a 62 y.o. male with COPD IV, chronic protein malnutrition, presents for evaluation of right flank pain and COPD follow-up. Matthew Morrison is oxygen dependent with 2 liters via nasal.   COPD  Anden reports that he has been doing well overall.  He's had no recently ED visits. Continues to manage COPD with LABA, SABA, daily nebulizer treatments, and chronic low dose prednisone. He continues to use 2 liters of oxygen daily and at bedtime. He continues to work to gain weight and drinks nutritional supplments 3-4 times daily. He continues to be followed by Dr. Darleene Cleaver (house-calls medicine based group) that provides him with monthly in home evaluations to monitor weight and chronic conditions. He denies any concerns today regarding COPD.  Right sided Flank Pain Right sided flank pain occurring since x 3 days. Denies injury, dysuria, hematuria,  chills, fever, or abdominal pain. Reports sensation that right side is pulling and pain is exacerbated with putting on pants and climbing stairs. Current pain intensity is 6/10. Pain is characterized as aching. He has not attempted relief with any other measures. Social History   Socioeconomic History  . Marital status: Single    Spouse name: Not on file  . Number of children: Not on file  . Years of education: Not on file  . Highest education level: Not on file  Social Needs  . Financial resource strain: Not on file  . Food insecurity - worry: Not on file  . Food insecurity - inability: Not on file  . Transportation needs - medical: Not on file  . Transportation needs - non-medical: Not on file  Occupational History  . Not on file  Tobacco Use  . Smoking status: Former Smoker    Packs/day: 1.00   Years: 40.00    Pack years: 40.00    Types: Cigarettes    Last attempt to quit: 09/06/2016    Years since quitting: 0.6  . Smokeless tobacco: Never Used  Substance and Sexual Activity  . Alcohol use: No  . Drug use: No  . Sexual activity: No  Other Topics Concern  . Not on file  Social History Narrative  . Not on file    Family History  Problem Relation Age of Onset  . Depression Mother   . Stroke Father     Review of Systems  Constitutional: Negative.   HENT: Negative.   Respiratory: Positive for shortness of breath and wheezing.   Cardiovascular: Negative.   Gastrointestinal: Negative.        Right-sided flank pain  Genitourinary: Negative.   Skin: Negative.   Neurological: Negative.   Psychiatric/Behavioral: Negative.     Patient Active Problem List   Diagnosis Date Noted  . Protein-calorie malnutrition, severe 11/21/2016  . Hyperkalemia 11/19/2016  . COPD with acute exacerbation (HCC) 11/19/2016  . COPD exacerbation (HCC) 11/01/2016  . Acute respiratory failure with hypoxia (HCC) 09/19/2016  . COPD GOLD IV 09/19/2016  . Essential hypertension 09/19/2016  . Hypercholesterolemia 09/19/2016    Allergies  Allergen Reactions  . Norflex [Orphenadrine] Hives    Prior to Admission medications   Medication Sig Start Date End Date Taking? Authorizing Provider  albuterol (  PROVENTIL) (2.5 MG/3ML) 0.083% nebulizer solution TAKE 3 MILLILITERS BY NEBULIZER FOUR TIMES DAILY 05/01/17  Yes Bing Neighbors, FNP  budesonide (PULMICORT) 0.25 MG/2ML nebulizer solution Take 2 mLs (0.25 mg total) by nebulization 2 (two) times daily. 12/29/16 07/27/17 Yes Bing Neighbors, FNP  famotidine (PEPCID) 20 MG tablet Take 1 tablet (20 mg total) by mouth at bedtime. 01/16/17  Yes Bing Neighbors, FNP  hydrOXYzine (ATARAX/VISTARIL) 10 MG tablet TAKE 1 TABLET BY MOUTH 3 TIMES DAILY AS NEEDED FOR ANXIETY. 01/16/17  Yes Bing Neighbors, FNP  ipratropium (ATROVENT) 0.02 % nebulizer  solution Take 2.5 mLs (0.5 mg total) by nebulization 4 (four) times daily. 01/16/17  Yes Bing Neighbors, FNP  mupirocin cream (BACTROBAN) 2 % Apply topically 2 (two) times daily. Patient taking differently: Apply 1 application topically 2 (two) times daily as needed (wound care).  11/03/16  Yes Emokpae, Ejiroghene E, MD  omeprazole (PRILOSEC) 40 MG capsule Take 1 capsule (40 mg total) by mouth daily. 02/01/17  Yes Bing Neighbors, FNP  OXYGEN 2 lpm with sleep and occ during the day   Yes [provider]  predniSONE (DELTASONE) 20 MG tablet Take 1 tablet (20 mg total) daily with breakfast by mouth. 02/07/17  Yes Bing Neighbors, FNP  PROVENTIL HFA 108 [phone] Base) MCG/ACT inhaler Take 108 mcg by mouth 4 (four) times daily as needed. 12/28/16  Yes [provider]  Tiotropium Bromide-Olodaterol (STIOLTO RESPIMAT) 2.5-2.5 MCG/ACT AERS Inhale 2 puffs into the lungs daily. 02/01/17  Yes Bing Neighbors, FNP    Past Medical, Surgical Family and Social History reviewed and updated.    Objective:   Today's Vitals   05/10/17 0955  BP: 128/90  Pulse: 92  Resp: 14  Temp: 98 F (36.7 C)  TempSrc: Oral  SpO2: 97%  Weight: 125 lb (56.7 kg)  Height: 5\' 4"  (1.626 m)    Wt Readings from Last 3 Encounters:  05/10/17 125 lb (56.7 kg)  02/07/17 115 lb (52.2 kg)  01/07/17 110 lb (49.9 kg)    Physical Exam Constitutional: Patient appears well-developed and well-nourished. No distress. HENT: Normocephalic, atraumatic, External right and left ear normal. Oropharynx is clear and moist.  Eyes: Conjunctivae and EOM are normal. PERRLA, no scleral icterus. Neck: Normal ROM. Neck supple. No JVD. No tracheal deviation. No thyromegaly. CVS: RRR, S1/S2 +, no murmurs, no gallops, no carotid bruit.  Pulmonary: Effort normal. Breath sounds diminished throughout. Abdominal: Soft. BS +, no distension, tenderness, rebound or guarding.  Musculoskeletal: Normal range of motion. No edema and  no tenderness.  Lymphadenopathy: No cervical adenopathy. Neuro: Alert. Normal muscle tone coordination.  Skin: Skin is warm and dry. No rash noted. Not diaphoretic. No erythema. No pallor. Psychiatric: Normal mood and affect. Behavior, judgment, thought content normal.     Assessment & Plan:  1. COPD GOLD IV, symptoms remain well controlled.  He will continue prednisone 20 mg once daily, continue nebulizer treatments 4 times daily, Respimat, and supplemental oxygen at 2 L via nasal cannula. Continue follow-up with pulmonology. Return for care if symptoms are not relieved with medication therapy.  2. Protein-calorie malnutrition, severe, continue Ensure nutritional supplements, continue 4-5 high-protein high-calorie meals per day. Current Body mass index is 21.46 kg/m. Achieved weight gain goal of greater 120 pounds.   3. Essential hypertension, remains controlled today in spite of holding antihypertensive medications.  Will continue to hold antihypertensive medications as patient has become severely hypotensive previously. Continue to monitor BP if  readings exceed 140/80 consistently, will consider resuming amlodipine.  4. Flank Pain, right sided. KUB to rule out renal stone. If negative, this is likely secondary to MSK inflammation in which I will treat with acetaminophen #3, every 4 hours as needed. Will follow-up via phone with results of KUB    RTC: 3 months for chronic condition management.   Godfrey PickKimberly S. Tiburcio PeaHarris, MSN, FNP-C The Patient Care [facility]  [address] [place], [place] [address] [phone]

## 2017-05-10 NOTE — Telephone Encounter (Signed)
Contact patient to advise his recent imaging was negative of a renal stone.  His pain is likely coming from a large amount of constipation which was noted on the imaging.  I will send over a prescription for MiraLAX I would like him to take MiraLAX twice daily over the course of the next 3-5 days to relieve stool burden.  He can continue pain medicine as needed if symptoms persist.  Greater than 10 days he should follow-up here in the office.  He may also repeat MiraLAX if he notices that the texture of his stools become hard.  [facility].  Godfrey PickKimberly S. Tiburcio PeaHarris, MSN, FNP-C The Patient Care [facility]  [address] [place], [place] [address] [phone]

## 2017-05-10 NOTE — Patient Instructions (Signed)
Contact Dr. Thurston HoleWert's office -Phone: [phone] to schedule pulmonology follow-up     Flank Pain Flank pain is pain in your side. The flank is the area of your side between your upper belly (abdomen) and your back. The pain may occur over a short period of time (acute) or may be long-term or come back often (chronic). It may be mild or very bad. Pain in this area can be caused by many different things. Follow these instructions at home:  Rest as told by your doctor.  Drink enough fluid to keep your pee (urine) clear or pale yellow.  Take over-the-counter and prescription medicines only as told by your doctor.  Keep all follow-up visits as told by your doctor. This is important. Contact a doctor if:  Medicine does not help your pain.  You have new symptoms.  Your pain gets worse.  You have a fever.  Your symptoms last longer than 2-3 days. Get help right away if:  Your tummy hurts or is swollen.  You are short of breath.  You feel sick to your stomach (nauseous) and it does not go away.  You cannot stop throwing up (vomiting).  You feel like you will pass out or you do pass out (faint).  You have blood in your pee.  You have a fever and your symptoms suddenly get worse. This information is not intended to replace advice given to you by your health care provider. Make sure you discuss any questions you have with your health care provider. Document Released: 12/29/2007 Document Revised: 12/11/2015 Document Reviewed: 12/23/2014 Elsevier Interactive Patient Education  2018 [employer]

## 2017-05-10 NOTE — Telephone Encounter (Signed)
Patient notified

## 2017-05-22 ENCOUNTER — Telehealth: Payer: Self-pay | Admitting: Family Medicine

## 2017-05-22 ENCOUNTER — Telehealth: Payer: Self-pay

## 2017-05-22 MED ORDER — PROVENTIL HFA 108 (90 BASE) MCG/ACT IN AERS: 2.0000 | INHALATION_SPRAY | RESPIRATORY_TRACT | 3 refills | Status: AC | PRN

## 2017-05-22 MED ORDER — TIOTROPIUM BROMIDE-OLODATEROL 2.5-2.5 MCG/ACT IN AERS: 2.0000 | INHALATION_SPRAY | Freq: Every day | RESPIRATORY_TRACT | 11 refills | Status: DC

## 2017-05-22 MED ORDER — PREDNISONE 20 MG PO TABS: 20.0000 mg | ORAL_TABLET | Freq: Every day | ORAL | 3 refills | Status: DC

## 2017-05-22 MED ORDER — POLYETHYLENE GLYCOL 3350 17 G PO PACK: 17.0000 g | PACK | Freq: Two times a day (BID) | ORAL | 0 refills | Status: AC

## 2017-05-22 MED ORDER — HYDROXYZINE HCL 10 MG PO TABS: ORAL_TABLET | ORAL | 4 refills | Status: AC

## 2017-05-22 MED ORDER — IPRATROPIUM BROMIDE 0.02 % IN SOLN: 0.5000 mg | Freq: Four times a day (QID) | RESPIRATORY_TRACT | 4 refills | Status: DC

## 2017-05-22 MED ORDER — ALBUTEROL SULFATE (2.5 MG/3ML) 0.083% IN NEBU: INHALATION_SOLUTION | RESPIRATORY_TRACT | 0 refills | Status: DC

## 2017-05-22 MED ORDER — OMEPRAZOLE 40 MG PO CPDR: 40.0000 mg | DELAYED_RELEASE_CAPSULE | Freq: Every day | ORAL | 11 refills | Status: AC

## 2017-05-22 MED ORDER — FAMOTIDINE 20 MG PO TABS: 20.0000 mg | ORAL_TABLET | Freq: Every day | ORAL | 4 refills | Status: DC

## 2017-05-22 MED ORDER — BUDESONIDE 0.25 MG/2ML IN SUSP: 0.2500 mg | Freq: Two times a day (BID) | RESPIRATORY_TRACT | 2 refills | Status: DC

## 2017-05-22 MED FILL — predniSONE 20 MG TABS: 20 | 30 days supply | Qty: 30 | Fill #0

## 2017-05-22 MED FILL — PROVENTIL HFA 108 (90 BASE): 108 (90 BAS | 17 days supply | Qty: 7 | Fill #0

## 2017-05-22 MED FILL — hydrOXYzine HCL 10 MG TABS: 10 | 30 days supply | Qty: 90 | Fill #0

## 2017-05-22 NOTE — Telephone Encounter (Signed)
All medications have been e-prescribed to [facility]

## 2017-05-22 NOTE — Telephone Encounter (Signed)
Patient states that he needs you to contact [facility] and okay for them to pickup his oxygen tank and bring it to the shelter.

## 2017-05-22 NOTE — Telephone Encounter (Signed)
Patient notified

## 2017-05-22 NOTE — Telephone Encounter (Signed)
Patient notified and will call pulmonologist.

## 2017-05-22 NOTE — Telephone Encounter (Signed)
[facility] is requesting an order to discontinue patient's oxygen. Patient is apparently living in shelter and needs to have oxygen picked -up from shelter. Oxygen is prescribed PRN by Dr. Rolin BarryWerts office and patient will need to follow-up with Pulmonology in order to have oxygen completely discontinued.   Matthew PickKimberly S. Tiburcio PeaHarris, MSN, FNP-C The Patient Care [facility]  [address] [place], [place] [address] [phone]

## 2017-05-22 NOTE — Telephone Encounter (Signed)
Contact [facility] and see what information or if an order is needed to transfer oxygen to a shelter and find out from patient which shelter he is residing in by confirming that we have the correct demographic information in the system.  Godfrey PickKimberly S. Tiburcio PeaHarris, MSN, FNP-C The Patient Care [facility]  [address] [place], [place] [address] [phone]

## 2017-05-23 ENCOUNTER — Other Ambulatory Visit: Payer: Self-pay | Admitting: Family Medicine

## 2017-05-23 ENCOUNTER — Telehealth: Payer: Self-pay | Admitting: Internal Medicine

## 2017-05-23 DIAGNOSIS — J9601 Acute respiratory failure with hypoxia: Secondary | ICD-10-CM

## 2017-05-23 DIAGNOSIS — J449 Chronic obstructive pulmonary disease, unspecified: Secondary | ICD-10-CM

## 2017-05-23 DIAGNOSIS — J441 Chronic obstructive pulmonary disease with (acute) exacerbation: Secondary | ICD-10-CM

## 2017-05-23 MED ORDER — IPRATROPIUM BROMIDE 0.02 % IN SOLN: 0.5000 mg | Freq: Four times a day (QID) | RESPIRATORY_TRACT | 4 refills | Status: DC

## 2017-05-23 NOTE — Telephone Encounter (Signed)
Order placed to D/C O2 Pt aware.  Nothing further needed.  

## 2017-05-23 NOTE — Telephone Encounter (Signed)
Ok to d/c 02  

## 2017-05-23 NOTE — Telephone Encounter (Signed)
MW are you ok with doing this, please advise thanks.

## 2017-05-30 ENCOUNTER — Encounter: Payer: Self-pay | Admitting: Pediatric Intensive Care

## 2017-06-05 ENCOUNTER — Telehealth: Payer: Self-pay

## 2017-06-05 ENCOUNTER — Other Ambulatory Visit: Payer: Self-pay

## 2017-06-05 MED ORDER — TIOTROPIUM BROMIDE-OLODATEROL 2.5-2.5 MCG/ACT IN AERS: 2.0000 | INHALATION_SPRAY | Freq: Every day | RESPIRATORY_TRACT | 11 refills | Status: AC

## 2017-06-05 MED ORDER — FAMOTIDINE 20 MG PO TABS: 20.0000 mg | ORAL_TABLET | Freq: Every day | ORAL | 4 refills | Status: AC

## 2017-06-05 MED FILL — FAMOTIDINE 20 MG TABLET: 20 | 30 days supply | Qty: 30 | Fill #0

## 2017-06-05 MED FILL — STIOLTO RESPIMAT INHAL SPRY: 2.5-2.5 | 30 days supply | Qty: 4 | Fill #0

## 2017-06-05 NOTE — Telephone Encounter (Signed)
Medication resent to [facility]

## 2017-06-06 ENCOUNTER — Other Ambulatory Visit: Payer: Self-pay

## 2017-06-06 ENCOUNTER — Encounter: Payer: Self-pay | Admitting: Pediatric Intensive Care

## 2017-06-06 MED ORDER — BUDESONIDE 0.25 MG/2ML IN SUSP: 0.2500 mg | Freq: Two times a day (BID) | RESPIRATORY_TRACT | 2 refills | Status: AC

## 2017-06-06 MED ORDER — ALBUTEROL SULFATE (2.5 MG/3ML) 0.083% IN NEBU: INHALATION_SOLUTION | RESPIRATORY_TRACT | 0 refills | Status: DC

## 2017-06-06 MED ORDER — IPRATROPIUM BROMIDE 0.02 % IN SOLN: 0.5000 mg | Freq: Four times a day (QID) | RESPIRATORY_TRACT | 4 refills | Status: DC

## 2017-06-06 NOTE — Telephone Encounter (Signed)
Medication sent to [facility]

## 2017-06-07 ENCOUNTER — Other Ambulatory Visit: Payer: Self-pay | Admitting: Family Medicine

## 2017-06-07 MED ORDER — IPRATROPIUM-ALBUTEROL 0.5-2.5 (3) MG/3ML IN SOLN: 3.0000 mL | Freq: Four times a day (QID) | RESPIRATORY_TRACT | 3 refills | Status: AC | PRN

## 2017-06-07 NOTE — Progress Notes (Signed)
Duo nebulizer treatment submitted to pharmacy per faxed request.   Godfrey PickKimberly S. Tiburcio PeaHarris, MSN, FNP-C The Patient Care [facility]  [address] [place], [place] [address] [phone]

## 2017-06-08 ENCOUNTER — Emergency Department (HOSPITAL_COMMUNITY): Payer: Medicaid Other

## 2017-06-08 ENCOUNTER — Other Ambulatory Visit: Payer: Self-pay

## 2017-06-08 ENCOUNTER — Encounter (HOSPITAL_COMMUNITY): Payer: Self-pay | Admitting: Emergency Medicine

## 2017-06-08 ENCOUNTER — Inpatient Hospital Stay (HOSPITAL_COMMUNITY)
Admission: EM | Admit: 2017-06-08 | Discharge: 2017-06-20 | DRG: 190 | Disposition: A | Discharge status: Skilled Nursing Facility | Payer: Medicaid Other | Attending: Internal Medicine | Admitting: Internal Medicine

## 2017-06-08 DIAGNOSIS — E7800 Pure hypercholesterolemia, unspecified: Secondary | ICD-10-CM | POA: Diagnosis present

## 2017-06-08 DIAGNOSIS — J209 Acute bronchitis, unspecified: Secondary | ICD-10-CM | POA: Diagnosis present

## 2017-06-08 DIAGNOSIS — R0600 Dyspnea, unspecified: Secondary | ICD-10-CM

## 2017-06-08 DIAGNOSIS — J069 Acute upper respiratory infection, unspecified: Secondary | ICD-10-CM | POA: Diagnosis present

## 2017-06-08 DIAGNOSIS — I10 Essential (primary) hypertension: Secondary | ICD-10-CM | POA: Diagnosis present

## 2017-06-08 DIAGNOSIS — J9622 Acute and chronic respiratory failure with hypercapnia: Secondary | ICD-10-CM | POA: Diagnosis present

## 2017-06-08 DIAGNOSIS — Z9119 Patient's noncompliance with other medical treatment and regimen: Secondary | ICD-10-CM

## 2017-06-08 DIAGNOSIS — F419 Anxiety disorder, unspecified: Secondary | ICD-10-CM | POA: Diagnosis present

## 2017-06-08 DIAGNOSIS — Z79899 Other long term (current) drug therapy: Secondary | ICD-10-CM

## 2017-06-08 DIAGNOSIS — Z7952 Long term (current) use of systemic steroids: Secondary | ICD-10-CM

## 2017-06-08 DIAGNOSIS — K219 Gastro-esophageal reflux disease without esophagitis: Secondary | ICD-10-CM | POA: Diagnosis present

## 2017-06-08 DIAGNOSIS — J9601 Acute respiratory failure with hypoxia: Secondary | ICD-10-CM

## 2017-06-08 DIAGNOSIS — J9602 Acute respiratory failure with hypercapnia: Secondary | ICD-10-CM

## 2017-06-08 DIAGNOSIS — J449 Chronic obstructive pulmonary disease, unspecified: Secondary | ICD-10-CM | POA: Diagnosis present

## 2017-06-08 DIAGNOSIS — E872 Acidosis: Secondary | ICD-10-CM | POA: Diagnosis present

## 2017-06-08 DIAGNOSIS — J441 Chronic obstructive pulmonary disease with (acute) exacerbation: Principal | ICD-10-CM | POA: Diagnosis present

## 2017-06-08 DIAGNOSIS — R269 Unspecified abnormalities of gait and mobility: Secondary | ICD-10-CM | POA: Diagnosis present

## 2017-06-08 DIAGNOSIS — Z590 Homelessness: Secondary | ICD-10-CM

## 2017-06-08 DIAGNOSIS — D649 Anemia, unspecified: Secondary | ICD-10-CM | POA: Diagnosis present

## 2017-06-08 DIAGNOSIS — K5900 Constipation, unspecified: Secondary | ICD-10-CM | POA: Diagnosis present

## 2017-06-08 DIAGNOSIS — Z7951 Long term (current) use of inhaled steroids: Secondary | ICD-10-CM

## 2017-06-08 DIAGNOSIS — Z9981 Dependence on supplemental oxygen: Secondary | ICD-10-CM

## 2017-06-08 DIAGNOSIS — J440 Chronic obstructive pulmonary disease with acute lower respiratory infection: Secondary | ICD-10-CM | POA: Diagnosis present

## 2017-06-08 DIAGNOSIS — J9621 Acute and chronic respiratory failure with hypoxia: Secondary | ICD-10-CM | POA: Diagnosis present

## 2017-06-08 LAB — COMPREHENSIVE METABOLIC PANEL
ALK PHOS: 54 U/L (ref 38–126)
ALT: 20 U/L (ref 17–63)
ANION GAP: 10 (ref 5–15)
AST: 23 U/L (ref 15–41)
Albumin: 3.7 g/dL (ref 3.5–5.0)
BILIRUBIN TOTAL: 0.6 mg/dL (ref 0.3–1.2)
BUN: 8 mg/dL (ref 6–20)
CALCIUM: 9.3 mg/dL (ref 8.9–10.3)
CO2: 31 mmol/L (ref 22–32)
CREATININE: 0.94 mg/dL (ref 0.61–1.24)
Chloride: 102 mmol/L (ref 101–111)
GFR calc Af Amer: >60 mL/min (ref >60)
GFR calc non Af Amer: >60 mL/min (ref >60)
Glucose, Bld: 114 mg/dL — ABNORMAL HIGH (ref 65–99)
Potassium: 3.6 mmol/L (ref 3.5–5.1)
SODIUM: 143 mmol/L (ref 135–145)
TOTAL PROTEIN: 6.9 g/dL (ref 6.5–8.1)

## 2017-06-08 LAB — I-STAT TROPONIN, ED: Troponin i, poc: 0 ng/mL (ref 0.00–0.08)

## 2017-06-08 LAB — CBC WITH DIFFERENTIAL/PLATELET
Basophils Absolute: 0 10*3/uL (ref 0.0–0.1)
Basophils Relative: 1 %
EOS ABS: 0 10*3/uL (ref 0.0–0.7)
Eosinophils Relative: 1 %
HCT: 38.4 % — ABNORMAL LOW (ref 39.0–52.0)
HEMOGLOBIN: 12.3 g/dL — AB (ref 13.0–17.0)
LYMPHS ABS: 1 10*3/uL (ref 0.7–4.0)
LYMPHS PCT: 16 %
MCH: 24.1 pg — AB (ref 26.0–34.0)
MCHC: 32 g/dL (ref 30.0–36.0)
MCV: 75.1 fL — ABNORMAL LOW (ref 78.0–100.0)
MONOS PCT: 17 %
Monocytes Absolute: 1.1 10*3/uL — ABNORMAL HIGH (ref 0.1–1.0)
NEUTROS PCT: 65 %
Neutro Abs: 4.4 10*3/uL (ref 1.7–7.7)
Platelets: 284 10*3/uL (ref 150–400)
RBC: 5.11 MIL/uL (ref 4.22–5.81)
RDW: 13.9 % (ref 11.5–15.5)
WBC: 6.7 10*3/uL (ref 4.0–10.5)

## 2017-06-08 LAB — INFLUENZA PANEL BY PCR (TYPE A & B)
INFLAPCR: NEGATIVE
Influenza B By PCR: NEGATIVE

## 2017-06-08 LAB — D-DIMER, QUANTITATIVE: D-Dimer, Quant: 0.35 ug/mL-FEU (ref 0.00–0.50)

## 2017-06-08 MED ORDER — SODIUM CHLORIDE 0.9 % IV SOLN
INTRAVENOUS | Status: AC
  Administered 2017-06-08 – 2017-06-09 (×2): via INTRAVENOUS

## 2017-06-08 MED ORDER — ONDANSETRON HCL 4 MG PO TABS: 4.0000 mg | ORAL_TABLET | Freq: Four times a day (QID) | ORAL | Status: DC | PRN

## 2017-06-08 MED ORDER — ALBUTEROL SULFATE HFA 108 (90 BASE) MCG/ACT IN AERS
2.0000 | INHALATION_SPRAY | Freq: Four times a day (QID) | RESPIRATORY_TRACT | Status: DC
  Filled 2017-06-08: qty 6.7

## 2017-06-08 MED ORDER — ACETAMINOPHEN 325 MG PO TABS: 650.0000 mg | ORAL_TABLET | Freq: Four times a day (QID) | ORAL | Status: DC | PRN

## 2017-06-08 MED ORDER — SODIUM CHLORIDE 0.9 % IV BOLUS (SEPSIS)
1000.0000 mL | Freq: Once | INTRAVENOUS | Status: AC
  Administered 2017-06-08: 1000 mL via INTRAVENOUS

## 2017-06-08 MED ORDER — METHYLPREDNISOLONE SODIUM SUCC 40 MG IJ SOLR
40.0000 mg | Freq: Four times a day (QID) | INTRAMUSCULAR | Status: DC
  Administered 2017-06-08 – 2017-06-09 (×3): 40 mg via INTRAVENOUS
  Filled 2017-06-08 (×3): qty 1

## 2017-06-08 MED ORDER — IPRATROPIUM BROMIDE 0.02 % IN SOLN
0.5000 mg | Freq: Once | RESPIRATORY_TRACT | Status: AC
  Administered 2017-06-08: 0.5 mg via RESPIRATORY_TRACT
  Filled 2017-06-08: qty 2.5

## 2017-06-08 MED ORDER — ONDANSETRON HCL 4 MG/2ML IJ SOLN: 4.0000 mg | Freq: Four times a day (QID) | INTRAMUSCULAR | Status: DC | PRN

## 2017-06-08 MED ORDER — ALBUTEROL SULFATE (2.5 MG/3ML) 0.083% IN NEBU
5.0000 mg | INHALATION_SOLUTION | Freq: Once | RESPIRATORY_TRACT | Status: AC
  Administered 2017-06-08: 5 mg via RESPIRATORY_TRACT
  Filled 2017-06-08: qty 6

## 2017-06-08 MED ORDER — PREDNISONE 20 MG PO TABS: 40.0000 mg | ORAL_TABLET | Freq: Every day | ORAL | Status: DC

## 2017-06-08 MED ORDER — ALBUTEROL SULFATE (2.5 MG/3ML) 0.083% IN NEBU
2.5000 mg | INHALATION_SOLUTION | RESPIRATORY_TRACT | Status: DC | PRN
  Administered 2017-06-10 (×2): 2.5 mg via RESPIRATORY_TRACT
  Filled 2017-06-08 (×2): qty 3

## 2017-06-08 MED ORDER — TRAMADOL HCL 50 MG PO TABS
50.0000 mg | ORAL_TABLET | Freq: Four times a day (QID) | ORAL | Status: DC | PRN
  Administered 2017-06-09: 50 mg via ORAL
  Filled 2017-06-08: qty 1

## 2017-06-08 MED ORDER — IPRATROPIUM-ALBUTEROL 0.5-2.5 (3) MG/3ML IN SOLN
3.0000 mL | Freq: Four times a day (QID) | RESPIRATORY_TRACT | Status: DC
  Administered 2017-06-08 – 2017-06-10 (×7): 3 mL via RESPIRATORY_TRACT
  Filled 2017-06-08 (×8): qty 3

## 2017-06-08 MED ORDER — MAGNESIUM SULFATE 2 GM/50ML IV SOLN
2.0000 g | Freq: Once | INTRAVENOUS | Status: AC
  Administered 2017-06-08: 2 g via INTRAVENOUS
  Filled 2017-06-08: qty 50

## 2017-06-08 MED ORDER — HYDROXYZINE HCL 25 MG PO TABS
50.0000 mg | ORAL_TABLET | Freq: Three times a day (TID) | ORAL | Status: DC | PRN
  Administered 2017-06-10 – 2017-06-20 (×10): 50 mg via ORAL
  Filled 2017-06-08 (×10): qty 2

## 2017-06-08 MED ORDER — POLYETHYLENE GLYCOL 3350 17 G PO PACK
17.0000 g | PACK | Freq: Every day | ORAL | Status: DC | PRN
  Administered 2017-06-14 – 2017-06-18 (×3): 17 g via ORAL
  Filled 2017-06-08 (×3): qty 1

## 2017-06-08 MED ORDER — PANTOPRAZOLE SODIUM 40 MG PO TBEC
40.0000 mg | DELAYED_RELEASE_TABLET | Freq: Every day | ORAL | Status: DC
  Administered 2017-06-08 – 2017-06-12 (×5): 40 mg via ORAL
  Filled 2017-06-08 (×5): qty 1

## 2017-06-08 MED ORDER — ACETAMINOPHEN 650 MG RE SUPP: 650.0000 mg | Freq: Four times a day (QID) | RECTAL | Status: DC | PRN

## 2017-06-08 MED ORDER — ENOXAPARIN SODIUM 40 MG/0.4ML ~~LOC~~ SOLN
40.0000 mg | SUBCUTANEOUS | Status: DC
  Administered 2017-06-08 – 2017-06-19 (×12): 40 mg via SUBCUTANEOUS
  Filled 2017-06-08 (×12): qty 0.4

## 2017-06-08 MED ORDER — METHYLPREDNISOLONE SODIUM SUCC 125 MG IJ SOLR
125.0000 mg | Freq: Once | INTRAMUSCULAR | Status: AC
  Administered 2017-06-08: 125 mg via INTRAVENOUS
  Filled 2017-06-08: qty 2

## 2017-06-08 NOTE — ED Notes (Signed)
Bed: WA23 Expected date:  Expected time:  Means of arrival:  Comments: 

## 2017-06-08 NOTE — ED Provider Notes (Signed)
[facility]-EMERGENCY DEPT Provider Note   CSN: [id number] Arrival date & time: 06/08/17  [id number]     History   Chief Complaint Chief Complaint  Patient presents with  . Shortness of Breath    HPI Matthew Morrison is a 62 y.o. male history of hypertension, COPD who presented with shortness of breath, cough.  Patient states that he has URI symptoms for the last 2-3 days.  States that he became more short of breath since last night.  He did run out of his inhalers and nebulizers at home.  He states that he is on 2 L nasal cannula only at night and has been using it more recently.  Patient is currently living in the shelter.  He denies any sick contacts or exposure to flu.  States that he has some leg swelling but denies any calf pain or history of blood clots.  Patient has known COPD and states that he does smoke but was unable to smoke last several days due to shortness of breath.  Patient did have several admissions previously for COPD exacerbation.  Denies any previous intubations.  The history is provided by the patient.    Past Medical History:  Diagnosis Date  . Asthma   . Emphysema lung (HCC)   . Hypertension     Patient Active Problem List   Diagnosis Date Noted  . Protein-calorie malnutrition, severe 11/21/2016  . Hyperkalemia 11/19/2016  . COPD with acute exacerbation (HCC) 11/19/2016  . COPD exacerbation (HCC) 11/01/2016  . Acute respiratory failure with hypoxia (HCC) 09/19/2016  . COPD GOLD IV 09/19/2016  . Essential hypertension 09/19/2016  . Hypercholesterolemia 09/19/2016    Past Surgical History:  Procedure Laterality Date  . HERNIA REPAIR         Home Medications    Prior to Admission medications   Medication Sig Start Date End Date Taking? Authorizing Provider  acetaminophen (TYLENOL) 500 MG tablet Take 1,000 mg by mouth daily as needed for headache.   Yes [provider]  famotidine (PEPCID) 20 MG tablet Take 1 tablet (20 mg  total) by mouth at bedtime. 06/05/17  Yes Bing NeighborsHarris, Kimberly S, FNP  hydrOXYzine (ATARAX/VISTARIL) 10 MG tablet TAKE 1 TABLET BY MOUTH 3 TIMES DAILY AS NEEDED FOR ANXIETY. 05/22/17  Yes Bing NeighborsHarris, Kimberly S, FNP  ipratropium-albuterol (DUONEB) 0.5-2.5 (3) MG/3ML SOLN Take 3 mLs by nebulization every 6 (six) hours as needed. 06/07/17  Yes Bing NeighborsHarris, Kimberly S, FNP  omeprazole (PRILOSEC) 40 MG capsule Take 1 capsule (40 mg total) by mouth daily. 05/22/17  Yes Bing NeighborsHarris, Kimberly S, FNP  OXYGEN 2 lpm with sleep and occ during the day   Yes [provider]  predniSONE (DELTASONE) 20 MG tablet Take 1 tablet (20 mg total) by mouth daily with breakfast. 05/22/17  Yes Bing NeighborsHarris, Kimberly S, FNP  PROVENTIL HFA 108 [phone](90 Base) MCG/ACT inhaler Inhale 2 puffs into the lungs every 4 (four) hours as needed for wheezing or shortness of breath. 05/22/17  Yes Bing NeighborsHarris, Kimberly S, FNP  Tiotropium Bromide-Olodaterol (STIOLTO RESPIMAT) 2.5-2.5 MCG/ACT AERS Inhale 2 puffs into the lungs daily. 06/05/17  Yes Bing NeighborsHarris, Kimberly S, FNP  acetaminophen-codeine (TYLENOL #3) 300-30 MG tablet Take 1 tablet by mouth every 4 (four) hours as needed for severe pain. Patient not taking: Reported on 06/08/2017 05/10/17   Bing NeighborsHarris, Kimberly S, FNP  budesonide (PULMICORT) 0.25 MG/2ML nebulizer solution Take 2 mLs (0.25 mg total) by nebulization 2 (two) times daily. Patient not taking: Reported on 06/08/2017 06/06/17  01/02/18  Bing Neighbors, FNP  mupirocin cream (BACTROBAN) 2 % Apply topically 2 (two) times daily. Patient not taking: Reported on 06/08/2017 11/03/16   Onnie Boer, MD  polyethylene glycol (MIRALAX) packet Take 17 g by mouth 2 (two) times daily. Patient not taking: Reported on 06/08/2017 05/22/17   Bing Neighbors, FNP    Family History Family History  Problem Relation Age of Onset  . Depression Mother   . Stroke Father     Social History Social History   Tobacco Use  . Smoking status: Former Smoker    Packs/day: 1.00     Years: 40.00    Pack years: 40.00    Types: Cigarettes    Last attempt to quit: 09/06/2016    Years since quitting: 0.7  . Smokeless tobacco: Never Used  Substance Use Topics  . Alcohol use: No  . Drug use: No     Allergies   Norflex [orphenadrine]   Review of Systems Review of Systems  Respiratory: Positive for shortness of breath.   All other systems reviewed and are negative.    Physical Exam Updated Vital Signs BP 119/85   Pulse (!) 117   Temp 98.3 F (36.8 C) (Oral)   Resp (!) 24   SpO2 90%   Physical Exam  Constitutional:  tachypneic   HENT:  Head: Normocephalic.  Mouth/Throat: Oropharynx is clear and moist.  Eyes: Pupils are equal, round, and reactive to light.  Neck: Normal range of motion.  Cardiovascular:  Tachycardic   Pulmonary/Chest:  Tachypneic, some abdominal breathing, some retractions. Diminished bilaterally, poor air movement with minimal wheezing   Abdominal: Soft. Bowel sounds are normal.  Musculoskeletal: Normal range of motion.       Right lower leg: Normal. He exhibits no tenderness and no edema.       Left lower leg: Normal. He exhibits no tenderness and no edema.  Neurological: He is alert.  Skin: Skin is warm.  Psychiatric: He has a normal mood and affect.  Nursing note and vitals reviewed.    ED Treatments / Results  Labs (all labs ordered are listed, but only abnormal results are displayed) Labs Reviewed  CBC WITH DIFFERENTIAL/PLATELET - Abnormal; Notable for the following components:      Result Value   Hemoglobin 12.3 (*)    HCT 38.4 (*)    MCV 75.1 (*)    MCH 24.1 (*)    Monocytes Absolute 1.1 (*)    All other components within normal limits  COMPREHENSIVE METABOLIC PANEL - Abnormal; Notable for the following components:   Glucose, Bld 114 (*)    All other components within normal limits  BLOOD GAS, VENOUS - Abnormal; Notable for the following components:   pCO2, Ven 63.0 (*)    Bicarbonate 31.6 (*)    Acid-Base  Excess 4.4 (*)    All other components within normal limits  D-DIMER, QUANTITATIVE (NOT AT [facility])  INFLUENZA PANEL BY PCR (TYPE A & B)  I-STAT TROPONIN, ED    EKG  EKG Interpretation  Date/Time:  Thursday June 08 2017 12:27:41 EST Ventricular Rate:  124 PR Interval:    QRS Duration: 64 QT Interval:  303 QTC Calculation: 436 R Axis:   75 Text Interpretation:  Sinus tachycardia LAE, consider biatrial enlargement Consider anterior infarct Since last tracing rate faster Confirmed by Richardean Canal [phone]) on 06/08/2017 12:30:32 PM       Radiology Dg Chest 2 View  Result Date: 06/08/2017 CLINICAL DATA:  Increasing shortness of breath and cough. EXAM: CHEST - 2 VIEW COMPARISON:  Chest x-ray dated January 07, 2017. FINDINGS: The heart size and mediastinal contours are within normal limits. Normal pulmonary vascularity. The lungs remain hyperinflated with emphysematous changes. Scarring in the right upper lobe is unchanged. No focal consolidation, pleural effusion, or pneumothorax. No acute osseous abnormality. IMPRESSION: COPD.  No active cardiopulmonary disease. Electronically Signed   By: Obie Dredge M.D.   On: 06/08/2017 08:27    Procedures Procedures (including critical care time)  Medications Ordered in ED Medications  albuterol (PROVENTIL) (2.5 MG/3ML) 0.083% nebulizer solution 5 mg (5 mg Nebulization Given 06/08/17 0749)  sodium chloride 0.9 % bolus 1,000 mL (0 mLs Intravenous Stopped 06/08/17 1352)  albuterol (PROVENTIL) (2.5 MG/3ML) 0.083% nebulizer solution 5 mg (5 mg Nebulization Given 06/08/17 1245)  ipratropium (ATROVENT) nebulizer solution 0.5 mg (0.5 mg Nebulization Given 06/08/17 1245)  methylPREDNISolone sodium succinate (SOLU-MEDROL) 125 mg/2 mL injection 125 mg (125 mg Intravenous Given 06/08/17 1245)  magnesium sulfate IVPB 2 g 50 mL (0 g Intravenous Stopped 06/08/17 1352)     Initial Impression / Assessment and Plan / ED Course  I have reviewed the triage vital signs and  the nursing notes.  Pertinent labs & imaging results that were available during my care of the patient were reviewed by me and considered in my medical decision making (see chart for details).    Corley Maffeo is a 62 y.o. male here with SOB, tachycardia. Likely COPD exacerbation. Has some abdominal breathing and retractions. Will get labs, VBG, CXR. Will give nebs, steroids. Also consider PE since he is more tachycardic than I expect so will get d-dimer.   2:24 PM More wheezing after nebs, steroids, magnesium. VBG reassuring. O2 dropped to 89% on RA. D-dime neg. CXR clear. Flu pending. Will admit for COPD exacerbation.   Final Clinical Impressions(s) / ED Diagnoses   Final diagnoses:  None    ED Discharge Orders    None       Charlynne Pander, MD 06/08/17 1425

## 2017-06-08 NOTE — ED Notes (Signed)
ED TO INPATIENT HANDOFF REPORT  Name/Age/Gender Matthew Morrison 62 y.o. male  Code Status    Code Status Orders  (From admission, onward)        Start     Ordered   06/08/17 1624  Full code  Continuous     06/08/17 1623    Code Status History    Date Active Date Inactive Code Status Order ID Comments User Context   11/19/2016 22:41 11/22/2016 19:00 Full Code [id number]  Vianne Bulls, MD ED   11/02/2016 02:03 11/03/2016 19:40 Full Code [id number]  Reubin Milan, MD Inpatient   09/19/2016 10:04 09/26/2016 19:09 Full Code [id number]  Bonnielee Haff, MD Inpatient      Home/SNF/Other Home  Chief Complaint copd,diff breathing  Level of Care/Admitting Diagnosis ED Disposition    ED Disposition Condition [facility] Area: [facility] [[id number]]  Level of Care: Med-Surg [16]  Diagnosis: COPD with acute exacerbation [facility]) [[id number]]  Admitting Physician: Cristy Folks [[id number]]  Attending Physician: Cristy Folks [[id number]]  PT Class (Do Not Modify): Observation [104]  PT Acc Code (Do Not Modify): Observation [10022]       Medical History Past Medical History:  Diagnosis Date  . Asthma   . Emphysema lung ([place])   . Hypertension     Allergies Allergies  Allergen Reactions  . Norflex [Orphenadrine] Hives    IV Location/Drains/Wounds Patient Lines/Drains/Airways Status   Active Line/Drains/Airways    Name:   Placement date:   Placement time:   Site:   Days:   Peripheral IV 06/08/17 Right Antecubital   06/08/17    1625    Antecubital   less than 1          Labs/Imaging Results for orders placed or performed during the hospital encounter of 06/08/17 (from the past 48 hour(s))  CBC with Differential/Platelet     Status: Abnormal   Collection Time: 06/08/17 12:37 PM  Result Value Ref Range   WBC 6.7 4.0 - 10.5 K/uL   RBC 5.11 4.22 - 5.81 MIL/uL   Hemoglobin 12.3 (L) 13.0 - 17.0 g/dL   HCT 38.4 (L) 39.0 - 52.0 %   MCV 75.1 (L)  78.0 - 100.0 fL   MCH 24.1 (L) 26.0 - 34.0 pg   MCHC 32.0 30.0 - 36.0 g/dL   RDW 13.9 11.5 - 15.5 %   Platelets 284 150 - 400 K/uL   Neutrophils Relative % 65 %   Neutro Abs 4.4 1.7 - 7.7 K/uL   Lymphocytes Relative 16 %   Lymphs Abs 1.0 0.7 - 4.0 K/uL   Monocytes Relative 17 %   Monocytes Absolute 1.1 (H) 0.1 - 1.0 K/uL   Eosinophils Relative 1 %   Eosinophils Absolute 0.0 0.0 - 0.7 K/uL   Basophils Relative 1 %   Basophils Absolute 0.0 0.0 - 0.1 K/uL    Comment: Performed at [facility], [place] [address], [place], [place] [address]  Comprehensive metabolic panel     Status: Abnormal   Collection Time: 06/08/17 12:37 PM  Result Value Ref Range   Sodium 143 135 - 145 mmol/L   Potassium 3.6 3.5 - 5.1 mmol/L   Chloride 102 101 - 111 mmol/L   CO2 31 22 - 32 mmol/L   Glucose, Bld 114 (H) 65 - 99 mg/dL   BUN 8 6 - 20 mg/dL   Creatinine, Ser 0.94 0.61 - 1.24 mg/dL   Calcium 9.3 8.9 -  10.3 mg/dL   Total Protein 6.9 6.5 - 8.1 g/dL   Albumin 3.7 3.5 - 5.0 g/dL   AST 23 15 - 41 U/L   ALT 20 17 - 63 U/L   Alkaline Phosphatase 54 38 - 126 U/L   Total Bilirubin 0.6 0.3 - 1.2 mg/dL   GFR calc non Af Amer >60 >60 mL/min   GFR calc Af Amer >60 >60 mL/min    Comment: (NOTE) The eGFR has been calculated using the CKD EPI equation. This calculation has not been validated in all clinical situations. eGFR's persistently <60 mL/min signify possible Chronic Kidney Disease.    Anion gap 10 5 - 15    Comment: Performed at [facility], [place] [address] [place], [place] [address]  D-dimer, quantitative (not at [facility])     Status: None   Collection Time: 06/08/17 12:37 PM  Result Value Ref Range   D-Dimer, Quant 0.35 0.00 - 0.50 ug/mL-FEU    Comment: (NOTE) At the manufacturer cut-off of 0.50 ug/mL FEU, this assay has been documented to exclude PE with a sensitivity and negative predictive value of 97 to 99%.  At this time, this assay has not been approved by  the FDA to exclude DVT/VTE. Results should be correlated with clinical presentation. Performed at [facility], [place] [address] [place], [place] [address]   I-stat troponin, ED     Status: None   Collection Time: 06/08/17 12:44 PM  Result Value Ref Range   Troponin i, poc 0.00 0.00 - 0.08 ng/mL   Comment 3            Comment: Due to the release kinetics of cTnI, a negative result within the first hours of the onset of symptoms does not rule out myocardial infarction with certainty. If myocardial infarction is still suspected, repeat the test at appropriate intervals.   Influenza panel by PCR (type A & B)     Status: None   Collection Time: 06/08/17 12:49 PM  Result Value Ref Range   Influenza A By PCR NEGATIVE NEGATIVE   Influenza B By PCR NEGATIVE NEGATIVE    Comment: (NOTE) The Xpert Xpress Flu assay is intended as an aid in the diagnosis of  influenza and should not be used as a sole basis for treatment.  This  assay is FDA approved for nasopharyngeal swab specimens only. Nasal  washings and aspirates are unacceptable for Xpert Xpress Flu testing. Performed at [facility], [place] [address], [place], [place] [address]   Blood gas, venous     Status: Abnormal (Preliminary result)   Collection Time: 06/08/17 12:50 PM  Result Value Ref Range   pH, Ven 7.321 7.250 - 7.430   pCO2, Ven 63.0 (H) 44.0 - 60.0 mmHg   pO2, Ven PENDING 32.0 - 45.0 mmHg   Bicarbonate 31.6 (H) 20.0 - 28.0 mmol/L   Acid-Base Excess 4.4 (H) 0.0 - 2.0 mmol/L   O2 Saturation 35.5 %   Patient temperature 98.6    Collection site DRAWN BY RN    Drawn by [phone]    Sample type VENOUS     Comment: Performed at [facility], [place] [address], [place], [place] [address]   Dg Chest 2 View  Result Date: 06/08/2017 CLINICAL DATA:  Increasing shortness of breath and cough. EXAM: CHEST - 2 VIEW COMPARISON:  Chest x-ray dated January 07, 2017. FINDINGS: The heart  size and mediastinal contours are within normal limits. Normal  pulmonary vascularity. The lungs remain hyperinflated with emphysematous changes. Scarring in the right upper lobe is unchanged. No focal consolidation, pleural effusion, or pneumothorax. No acute osseous abnormality. IMPRESSION: COPD.  No active cardiopulmonary disease. Electronically Signed   By: Titus Dubin M.D.   On: 06/08/2017 08:27    Pending Labs Unresulted Labs (From admission, onward)   Start     Ordered   06/09/17 3832  Basic metabolic panel  Tomorrow morning,   R     06/08/17 1623   06/09/17 0500  CBC  Tomorrow morning,   R     06/08/17 1623      Vitals/Pain Today's Vitals   06/08/17 1430 06/08/17 1431 06/08/17 1500 06/08/17 1530  BP: 122/87  110/79 (!) 107/93  Pulse: (!) 111  89 (!) 111  Resp: (!) 21  (!) 22 20  Temp:      TempSrc:      SpO2: (!) 89% 94% 95% 94%    Isolation Precautions No active isolations  Medications Medications  hydrOXYzine (ATARAX/VISTARIL) tablet 50 mg (not administered)  pantoprazole (PROTONIX) EC tablet 40 mg (not administered)  enoxaparin (LOVENOX) injection 40 mg (not administered)  0.9 %  sodium chloride infusion (not administered)  acetaminophen (TYLENOL) tablet 650 mg (not administered)    Or  acetaminophen (TYLENOL) suppository 650 mg (not administered)  traMADol (ULTRAM) tablet 50 mg (not administered)  polyethylene glycol (MIRALAX / GLYCOLAX) packet 17 g (not administered)  ondansetron (ZOFRAN) tablet 4 mg (not administered)    Or  ondansetron (ZOFRAN) injection 4 mg (not administered)  methylPREDNISolone sodium succinate (SOLU-MEDROL) 40 mg/mL injection 40 mg (not administered)    Followed by  predniSONE (DELTASONE) tablet 40 mg (not administered)  albuterol (PROVENTIL HFA;VENTOLIN HFA) 108 (90 Base) MCG/ACT inhaler 2 puff (not administered)  ipratropium-albuterol (DUONEB) 0.5-2.5 (3) MG/3ML nebulizer solution 3 mL (not administered)  albuterol (PROVENTIL)  (2.5 MG/3ML) 0.083% nebulizer solution 5 mg (5 mg Nebulization Given 06/08/17 0749)  sodium chloride 0.9 % bolus 1,000 mL (0 mLs Intravenous Stopped 06/08/17 1352)  albuterol (PROVENTIL) (2.5 MG/3ML) 0.083% nebulizer solution 5 mg (5 mg Nebulization Given 06/08/17 1245)  ipratropium (ATROVENT) nebulizer solution 0.5 mg (0.5 mg Nebulization Given 06/08/17 1245)  methylPREDNISolone sodium succinate (SOLU-MEDROL) 125 mg/2 mL injection 125 mg (125 mg Intravenous Given 06/08/17 1245)  magnesium sulfate IVPB 2 g 50 mL (0 g Intravenous Stopped 06/08/17 1352)    Mobility walks

## 2017-06-08 NOTE — ED Triage Notes (Signed)
Patient c/o COPD flare up for past couple days reports that he has ran out of inhaler and neb treatments,.

## 2017-06-08 NOTE — ED Notes (Signed)
Hospitalist at bedside 

## 2017-06-08 NOTE — H&P (Signed)
History and Physical    Matthew Morrison ZOX:[id number] DOB: 1955-04-28 DOA: 06/08/2017  PCP: Bing Neighbors, FNP   Patient coming from: home   Chief Complaint: SOB  HPI: Matthew Morrison is a 62 y.o. male with medical history significant of for GOLD stage IV COPD, prior tobacco abuse and HTN who comes in with 3 days of SOB.  Patient reports that for the past 3 days he has had increasing cough that is worse at night, coryza, shortness of breath.  He particularly reports dyspnea on exertion.  He denies any fevers but has reported chills.  He notices that he chronically has yellowish sputum but he has recently been coughing up green colored sputum with increasing amounts.  He has not had any nausea, vomiting, diarrhea, chest pain, palpitations, syncope, presyncope, lower examinee edema, rash, orthopnea, paroxysmal nocturnal dyspnea.  He does not smoke anymore.  He does have chronic lower extremity edema however he wears compression stockings for this.  Of note he reports that he ran out of his nebulizer medication for albuterol ipratropium.  ED Course: In the ED his vitals were notable only for tachycardia to 108, and mild hypoxia to 89% requiring 2 L.  Chest x-ray did not show any evidence of pneumonia.  His labs were reassuring.  Patient received IV steroids and duo nebs.  Review of Systems: As per HPI otherwise 10 point review of systems negative.    Past Medical History:  Diagnosis Date  . Asthma   . Emphysema lung (HCC)   . Hypertension     Past Surgical History:  Procedure Laterality Date  . HERNIA REPAIR       reports that he quit smoking about 9 months ago. His smoking use included cigarettes. He has a 40.00 pack-year smoking history. he has never used smokeless tobacco. He reports that he does not drink alcohol or use drugs.  Allergies  Allergen Reactions  . Norflex [Orphenadrine] Hives    Family History  Problem Relation Age of Onset  . Depression Mother   . Stroke Father      Prior to Admission medications   Medication Sig Start Date End Date Taking? Authorizing Provider  acetaminophen (TYLENOL) 500 MG tablet Take 1,000 mg by mouth daily as needed for headache.   Yes [provider]  famotidine (PEPCID) 20 MG tablet Take 1 tablet (20 mg total) by mouth at bedtime. 06/05/17  Yes Bing Neighbors, FNP  hydrOXYzine (ATARAX/VISTARIL) 10 MG tablet TAKE 1 TABLET BY MOUTH 3 TIMES DAILY AS NEEDED FOR ANXIETY. 05/22/17  Yes Bing Neighbors, FNP  ipratropium-albuterol (DUONEB) 0.5-2.5 (3) MG/3ML SOLN Take 3 mLs by nebulization every 6 (six) hours as needed. 06/07/17  Yes Bing Neighbors, FNP  omeprazole (PRILOSEC) 40 MG capsule Take 1 capsule (40 mg total) by mouth daily. 05/22/17  Yes Bing Neighbors, FNP  OXYGEN 2 lpm with sleep and occ during the day   Yes [provider]  predniSONE (DELTASONE) 20 MG tablet Take 1 tablet (20 mg total) by mouth daily with breakfast. 05/22/17  Yes Bing Neighbors, FNP  PROVENTIL HFA 108 [phone] Base) MCG/ACT inhaler Inhale 2 puffs into the lungs every 4 (four) hours as needed for wheezing or shortness of breath. 05/22/17  Yes Bing Neighbors, FNP  Tiotropium Bromide-Olodaterol (STIOLTO RESPIMAT) 2.5-2.5 MCG/ACT AERS Inhale 2 puffs into the lungs daily. 06/05/17  Yes Bing Neighbors, FNP  acetaminophen-codeine (TYLENOL #3) 300-30 MG tablet Take 1 tablet by mouth every 4 (  four) hours as needed for severe pain. Patient not taking: Reported on 06/08/2017 05/10/17   Bing Neighbors, FNP  budesonide (PULMICORT) 0.25 MG/2ML nebulizer solution Take 2 mLs (0.25 mg total) by nebulization 2 (two) times daily. Patient not taking: Reported on 06/08/2017 06/06/17 01/02/18  Bing Neighbors, FNP  mupirocin cream (BACTROBAN) 2 % Apply topically 2 (two) times daily. Patient not taking: Reported on 06/08/2017 11/03/16   Onnie Boer, MD  polyethylene glycol (MIRALAX) packet Take 17 g by mouth 2 (two) times daily. Patient not  taking: Reported on 06/08/2017 05/22/17   Bing Neighbors, FNP    Physical Exam: Vitals:   06/08/17 1421 06/08/17 1422 06/08/17 1430 06/08/17 1431  BP:   122/87   Pulse: (!) 115 (!) 117 (!) 111   Resp: (!) 23 (!) 24 (!) 21   Temp:      TempSrc:      SpO2: (!) 89% 90% (!) 89% 94%    Constitutional: NAD, calm, comfortable Vitals:   06/08/17 1421 06/08/17 1422 06/08/17 1430 06/08/17 1431  BP:   122/87   Pulse: (!) 115 (!) 117 (!) 111   Resp: (!) 23 (!) 24 (!) 21   Temp:      TempSrc:      SpO2: (!) 89% 90% (!) 89% 94%   Eyes: Anicteric sclera ENMT: Dry mucous membranes.  Neck: normal, supple Respiratory: Diminished movement, moderately increased work of breathing, inspiratory and expiratory wheezes throughout, no crackles, no rales Cardiovascular: Rate and rhythm, distant heart sounds Abdomen: no tenderness, no masses palpated. No hepatosplenomegaly. Bowel sounds positive.  Musculoskeletal: Compression stockings in place Skin: no rashes Neurologic: Grossly intact moving all extremities Psychiatric: Normal judgment and insight. Alert and oriented x 3. Normal mood.    Labs on Admission: I have personally reviewed following labs and imaging studies  CBC: Recent Labs  Lab 06/08/17 1237  WBC 6.7  NEUTROABS 4.4  HGB 12.3*  HCT 38.4*  MCV 75.1*  PLT 284   Basic Metabolic Panel: Recent Labs  Lab 06/08/17 1237  NA 143  K 3.6  CL 102  CO2 31  GLUCOSE 114*  BUN 8  CREATININE 0.94  CALCIUM 9.3   GFR: CrCl cannot be calculated (Unknown ideal weight.). Liver Function Tests: Recent Labs  Lab 06/08/17 1237  AST 23  ALT 20  ALKPHOS 54  BILITOT 0.6  PROT 6.9  ALBUMIN 3.7   No results for input(s): LIPASE, AMYLASE in the last 168 hours. No results for input(s): AMMONIA in the last 168 hours. Coagulation Profile: No results for input(s): INR, PROTIME in the last 168 hours. Cardiac Enzymes: No results for input(s): CKTOTAL, CKMB, CKMBINDEX, TROPONINI in the  last 168 hours. BNP (last 3 results) No results for input(s): PROBNP in the last 8760 hours. HbA1C: No results for input(s): HGBA1C in the last 72 hours. CBG: No results for input(s): GLUCAP in the last 168 hours. Lipid Profile: No results for input(s): CHOL, HDL, LDLCALC, TRIG, CHOLHDL, LDLDIRECT in the last 72 hours. Thyroid Function Tests: No results for input(s): TSH, T4TOTAL, FREET4, T3FREE, THYROIDAB in the last 72 hours. Anemia Panel: No results for input(s): VITAMINB12, FOLATE, FERRITIN, TIBC, IRON, RETICCTPCT in the last 72 hours. Urine analysis:    Component Value Date/Time   COLORURINE STRAW (A) 09/20/2016 1216   APPEARANCEUR CLEAR 09/20/2016 1216   LABSPEC 1.015 05/10/2017 1002   PHURINE 7.0 05/10/2017 1002   GLUCOSEU NEGATIVE 05/10/2017 1002   HGBUR NEGATIVE 05/10/2017 1002  BILIRUBINUR NEGATIVE 05/10/2017 1002   KETONESUR NEGATIVE 05/10/2017 1002   PROTEINUR NEGATIVE 05/10/2017 1002   UROBILINOGEN 0.2 05/10/2017 1002   NITRITE NEGATIVE 05/10/2017 1002   LEUKOCYTESUR NEGATIVE 05/10/2017 1002    Radiological Exams on Admission: Dg Chest 2 View  Result Date: 06/08/2017 CLINICAL DATA:  Increasing shortness of breath and cough. EXAM: CHEST - 2 VIEW COMPARISON:  Chest x-ray dated January 07, 2017. FINDINGS: The heart size and mediastinal contours are within normal limits. Normal pulmonary vascularity. The lungs remain hyperinflated with emphysematous changes. Scarring in the right upper lobe is unchanged. No focal consolidation, pleural effusion, or pneumothorax. No acute osseous abnormality. IMPRESSION: COPD.  No active cardiopulmonary disease. Electronically Signed   By: Obie DredgeWilliam T Derry M.D.   On: 06/08/2017 08:27    EKG: Independently reviewed.  Sinus tachycardia no acute ST segment changes  Assessment/Plan Active Problems:   COPD GOLD IV   Hypercholesterolemia   COPD with acute exacerbation (HCC)  #) Acute COPD exacerbation: Likely in the setting of viral URI.   No elevated white count, no opacity on chest x-ray and no fevers. -IV methylprednisolone, transition to p.o. tomorrow -Duo nebs every 4 hours and as needed -Hold on IV antibiotics -Per chart patient is not on any controller medications despite his severe COPD, consider discharging patient on long-acting beta agonist/inhaled corticosteroid with long-acting muscarinic  #) Hypertension: Patient reports he is a not on any medication for this -Monitor  Fluids: Gentle IV fluids Elect lites: Monitor and supplement Nutrition: Regular diet  Prophylaxis: Subcu enoxaparin  Disposition: Pending improved respiration status  Full code    Delaine LameShrey C Allisyn Kunz MD [facility]   If 7PM-7AM, please contact night-coverage [email or web address] Password [email or web address]  06/08/2017, 3:08 PM

## 2017-06-09 DIAGNOSIS — R0603 Acute respiratory distress: Secondary | ICD-10-CM | POA: Diagnosis not present

## 2017-06-09 DIAGNOSIS — D649 Anemia, unspecified: Secondary | ICD-10-CM

## 2017-06-09 DIAGNOSIS — J441 Chronic obstructive pulmonary disease with (acute) exacerbation: Secondary | ICD-10-CM | POA: Diagnosis present

## 2017-06-09 DIAGNOSIS — Z79899 Other long term (current) drug therapy: Secondary | ICD-10-CM | POA: Diagnosis not present

## 2017-06-09 DIAGNOSIS — R269 Unspecified abnormalities of gait and mobility: Secondary | ICD-10-CM | POA: Diagnosis present

## 2017-06-09 DIAGNOSIS — K5900 Constipation, unspecified: Secondary | ICD-10-CM | POA: Diagnosis present

## 2017-06-09 DIAGNOSIS — E872 Acidosis: Secondary | ICD-10-CM | POA: Diagnosis present

## 2017-06-09 DIAGNOSIS — J9602 Acute respiratory failure with hypercapnia: Secondary | ICD-10-CM | POA: Diagnosis not present

## 2017-06-09 DIAGNOSIS — F411 Generalized anxiety disorder: Secondary | ICD-10-CM | POA: Diagnosis not present

## 2017-06-09 DIAGNOSIS — Z7951 Long term (current) use of inhaled steroids: Secondary | ICD-10-CM | POA: Diagnosis not present

## 2017-06-09 DIAGNOSIS — J9622 Acute and chronic respiratory failure with hypercapnia: Secondary | ICD-10-CM | POA: Diagnosis present

## 2017-06-09 DIAGNOSIS — J9601 Acute respiratory failure with hypoxia: Secondary | ICD-10-CM | POA: Diagnosis not present

## 2017-06-09 DIAGNOSIS — Z9119 Patient's noncompliance with other medical treatment and regimen: Secondary | ICD-10-CM | POA: Diagnosis not present

## 2017-06-09 DIAGNOSIS — R0600 Dyspnea, unspecified: Secondary | ICD-10-CM | POA: Diagnosis not present

## 2017-06-09 DIAGNOSIS — Z9981 Dependence on supplemental oxygen: Secondary | ICD-10-CM | POA: Diagnosis not present

## 2017-06-09 DIAGNOSIS — J440 Chronic obstructive pulmonary disease with acute lower respiratory infection: Secondary | ICD-10-CM | POA: Diagnosis present

## 2017-06-09 DIAGNOSIS — R262 Difficulty in walking, not elsewhere classified: Secondary | ICD-10-CM | POA: Diagnosis not present

## 2017-06-09 DIAGNOSIS — I10 Essential (primary) hypertension: Secondary | ICD-10-CM | POA: Diagnosis present

## 2017-06-09 DIAGNOSIS — J9621 Acute and chronic respiratory failure with hypoxia: Secondary | ICD-10-CM | POA: Diagnosis present

## 2017-06-09 DIAGNOSIS — J9600 Acute respiratory failure, unspecified whether with hypoxia or hypercapnia: Secondary | ICD-10-CM | POA: Diagnosis not present

## 2017-06-09 DIAGNOSIS — Z515 Encounter for palliative care: Secondary | ICD-10-CM | POA: Diagnosis not present

## 2017-06-09 DIAGNOSIS — K219 Gastro-esophageal reflux disease without esophagitis: Secondary | ICD-10-CM | POA: Diagnosis present

## 2017-06-09 DIAGNOSIS — J209 Acute bronchitis, unspecified: Secondary | ICD-10-CM | POA: Diagnosis present

## 2017-06-09 DIAGNOSIS — J449 Chronic obstructive pulmonary disease, unspecified: Secondary | ICD-10-CM | POA: Diagnosis not present

## 2017-06-09 DIAGNOSIS — F419 Anxiety disorder, unspecified: Secondary | ICD-10-CM | POA: Diagnosis present

## 2017-06-09 DIAGNOSIS — Z7189 Other specified counseling: Secondary | ICD-10-CM | POA: Diagnosis not present

## 2017-06-09 DIAGNOSIS — Z590 Homelessness: Secondary | ICD-10-CM | POA: Diagnosis not present

## 2017-06-09 DIAGNOSIS — Z7952 Long term (current) use of systemic steroids: Secondary | ICD-10-CM | POA: Diagnosis not present

## 2017-06-09 DIAGNOSIS — J069 Acute upper respiratory infection, unspecified: Secondary | ICD-10-CM | POA: Diagnosis present

## 2017-06-09 DIAGNOSIS — E7800 Pure hypercholesterolemia, unspecified: Secondary | ICD-10-CM | POA: Diagnosis present

## 2017-06-09 LAB — BLOOD GAS, VENOUS
Acid-Base Excess: 4.4 mmol/L — ABNORMAL HIGH (ref 0.0–2.0)
BICARBONATE: 31.6 mmol/L — AB (ref 20.0–28.0)
Drawn by: 257881
O2 SAT: 35.5 %
PATIENT TEMPERATURE: 98.6
pCO2, Ven: 63 mmHg — ABNORMAL HIGH (ref 44.0–60.0)
pH, Ven: 7.321 (ref 7.250–7.430)

## 2017-06-09 LAB — CBC
HCT: 33.2 % — ABNORMAL LOW (ref 39.0–52.0)
Hemoglobin: 10.3 g/dL — ABNORMAL LOW (ref 13.0–17.0)
MCH: 23.8 pg — ABNORMAL LOW (ref 26.0–34.0)
MCHC: 31 g/dL (ref 30.0–36.0)
MCV: 76.7 fL — ABNORMAL LOW (ref 78.0–100.0)
Platelets: 267 K/uL (ref 150–400)
RBC: 4.33 MIL/uL (ref 4.22–5.81)
RDW: 14.2 % (ref 11.5–15.5)
WBC: 4.2 K/uL (ref 4.0–10.5)

## 2017-06-09 LAB — BASIC METABOLIC PANEL WITH GFR
Chloride: 107 mmol/L (ref 101–111)
GFR calc Af Amer: >60 mL/min (ref >60)
GFR calc non Af Amer: >60 mL/min (ref >60)
Potassium: 4.9 mmol/L (ref 3.5–5.1)
Sodium: 140 mmol/L (ref 135–145)

## 2017-06-09 LAB — BASIC METABOLIC PANEL
Anion gap: 3 — ABNORMAL LOW (ref 5–15)
BUN: 12 mg/dL (ref 6–20)
CO2: 30 mmol/L (ref 22–32)
Calcium: 8.4 mg/dL — ABNORMAL LOW (ref 8.9–10.3)
Creatinine, Ser: 0.65 mg/dL (ref 0.61–1.24)
Glucose, Bld: 132 mg/dL — ABNORMAL HIGH (ref 65–99)

## 2017-06-09 MED ORDER — BUDESONIDE 0.5 MG/2ML IN SUSP
0.5000 mg | Freq: Two times a day (BID) | RESPIRATORY_TRACT | Status: DC
  Administered 2017-06-09 – 2017-06-18 (×19): 0.5 mg via RESPIRATORY_TRACT
  Filled 2017-06-09 (×18): qty 2

## 2017-06-09 MED ORDER — METHYLPREDNISOLONE SODIUM SUCC 40 MG IJ SOLR
40.0000 mg | Freq: Four times a day (QID) | INTRAMUSCULAR | Status: DC
  Administered 2017-06-09 – 2017-06-10 (×4): 40 mg via INTRAVENOUS
  Filled 2017-06-09 (×4): qty 1

## 2017-06-09 MED ORDER — LEVOFLOXACIN 500 MG PO TABS
500.0000 mg | ORAL_TABLET | Freq: Every day | ORAL | Status: DC
  Administered 2017-06-09 – 2017-06-13 (×5): 500 mg via ORAL
  Filled 2017-06-09 (×5): qty 1

## 2017-06-09 MED ORDER — ENSURE ENLIVE PO LIQD
237.0000 mL | Freq: Three times a day (TID) | ORAL | Status: DC
  Administered 2017-06-09 – 2017-06-20 (×26): 237 mL via ORAL

## 2017-06-09 NOTE — Care Management Note (Signed)
Case Management Note  Patient Details  Name: Matthew Morrison MRN: [id number] Date of Birth: 1955/08/30  Subjective/Objective:      62 yo admitted with COPD exacerbation.              Action/Plan: CM consult for med help and active with [facility]. [facility] Pt does have active Medicaid so CM is unable to assist financially with medications.  Expected Discharge Date:  (unknown)               Expected Discharge Plan:  Home/Self Care  In-House Referral:     Discharge planning Services  CM Consult  Post Acute Care Choice:    Choice offered to:     DME Arranged:    DME Agency:     HH Arranged:    HH Agency:     Status of Service:  In process, will continue to follow  If discussed at Long Length of Stay Meetings, dates discussed:    Additional CommentsBartholome Bill:  Calene Paradiso H, RN 06/09/2017, 10:35 AM  [phone]

## 2017-06-09 NOTE — Progress Notes (Signed)
Initial Nutrition Assessment  DOCUMENTATION CODES:   Not applicable  INTERVENTION:   Ensure Enlive po TID, each supplement provides 350 kcal and 20 grams of protein  NUTRITION DIAGNOSIS:   Inadequate oral intake related to other (see comment)(breathing difficulty) as evidenced by per patient/family report.  GOAL:   Patient will meet greater than or equal to 90% of their needs  MONITOR:   PO intake, Supplement acceptance, Weight trends, Labs  REASON FOR ASSESSMENT:   Consult COPD Protocol  ASSESSMENT:   Patient with PMH significant for COPD, prior tobacco abuse, and HTN. Presents this admission with SOB 3 days PTA.    Spoke with pt at bedside. Appetite decreased to two meals per day when breathing difficulty began three days ago. Prior to those three days pt consumed three meals with snacks and ensure BID. Now that his breathing has improved, his appetite is approaching baseline. Patient had 100% of his breakfast potatoes and omelet this morning. RD to send supplementation per pt request. Will provide supplement coupons as pt expressed concern over pricing once discharged.   Pt endorses a UBW of 120 lb. States each time he goes to the doctor his weight has increased. Records indicate pt has gained 24 lb since July 2018 (pt confirmed). Nutrition-Focused physical exam completed. Pt has history of malnutrition. With improvement of appetite and weight gain, it seems malnutrition has resolved. Will continue to monitor.   Medications reviewed and include: solumedrol Labs reviewed.   NUTRITION - FOCUSED PHYSICAL EXAM:    Most Recent Value  Orbital Region  No depletion  Upper Arm Region  Mild depletion  Thoracic and Lumbar Region  Unable to assess  Buccal Region  No depletion  Temple Region  Moderate depletion  Clavicle Bone Region  Moderate depletion  Clavicle and Acromion Bone Region  Mild depletion  Scapular Bone Region  Unable to assess  Dorsal Hand  Mild depletion   Patellar Region  Moderate depletion  Anterior Thigh Region  Moderate depletion  Posterior Calf Region  Mild depletion  Edema (RD Assessment)  None     Diet Order:  Diet regular Room service appropriate? Yes; Fluid consistency: Thin  EDUCATION NEEDS:   Education needs have been addressed  Skin:  Skin Assessment: Reviewed RN Assessment  Last BM:  06/08/17  Height:   Ht Readings from Last 1 Encounters:  06/08/17 5\' 4"  (1.626 m)    Weight:   Wt Readings from Last 1 Encounters:  06/08/17 128 lb 12 oz (58.4 kg)    Ideal Body Weight:  59.1 kg  BMI:  Body mass index is 22.1 kg/m.  Estimated Nutritional Needs:   Kcal:  1550-1750 kcal/day  Protein:  80-90 g/day  Fluid:  >1.5 L/day    Vanessa Kickarly Aprile Dickenson RD, LDN Clinical Nutrition Pager # - [phone]

## 2017-06-09 NOTE — Progress Notes (Signed)
[facility] PROGRESS NOTE  Matthew Morrison ZOX:[id number] DOB: February 07, 1956 DOA: 06/08/2017  PCP: Matthew Neighbors, FNP  Brief History/Interval Summary: 62 year old African-American male with a past medical history of gold stage IV COPD, prior tobacco abuse, hypertension who is on chronic steroids at home presented with 3-day history of shortness of breath.  He was noted to have COPD exacerbation.  He was hospitalized for further management.  Also noted to be mildly hypoxic.  Reason for Visit: Acute COPD exacerbation  Consultants: None  Procedures: None  Antibiotics: Initiate Levaquin  Subjective/Interval History: Continues to have cough with greenish expectoration.  Feels slightly better compared to yesterday.  No chest pain.  ROS: Denies any nausea or vomiting  Objective:  Vital Signs  Vitals:   06/08/17 2135 06/08/17 2145 06/09/17 0510 06/09/17 0844  BP:  110/86 (!) 129/97   Pulse:  (!) 105 76   Resp:  20 20   Temp:  99 F (37.2 C) 98 F (36.7 C)   TempSrc:  Oral Oral   SpO2: 96% 98% 100% 98%  Weight:      Height:        Intake/Output Summary (Last 24 hours) at 06/09/2017 1123 Last data filed at 06/08/2017 1900 Gross per 24 hour  Intake 1290 ml  Output -  Net 1290 ml   Filed Weights   06/08/17 1732  Weight: 58.4 kg (128 lb 12 oz)    General appearance: alert, cooperative, appears stated age and no distress Head: Normocephalic, without obvious abnormality, atraumatic Resp: Wheezing heard bilaterally with a few crackles at the bases.  Scattered rhonchi.  Mildly tachypneic at rest. Cardio: regular rate and rhythm, S1, S2 normal, no murmur, click, rub or gallop GI: soft, non-tender; bowel sounds normal; no masses,  no organomegaly Extremities: extremities normal, atraumatic, no cyanosis or edema Neurologic: Cranial nerves II to XII intact.  Motor strength equal bilateral upper and lower extremities.  Lab Results:  Data Reviewed: I have personally reviewed  following labs and imaging studies  CBC: Recent Labs  Lab 06/08/17 1237 06/09/17 0646  WBC 6.7 4.2  NEUTROABS 4.4  --   HGB 12.3* 10.3*  HCT 38.4* 33.2*  MCV 75.1* 76.7*  PLT 284 267    Basic Metabolic Panel: Recent Labs  Lab 06/08/17 1237 06/09/17 0646  NA 143 140  K 3.6 4.9  CL 102 107  CO2 31 30  GLUCOSE 114* 132*  BUN 8 12  CREATININE 0.94 0.65  CALCIUM 9.3 8.4*    GFR: Estimated Creatinine Clearance: 82.1 mL/min (by C-G formula based on SCr of 0.65 mg/dL).  Liver Function Tests: Recent Labs  Lab 06/08/17 1237  AST 23  ALT 20  ALKPHOS 54  BILITOT 0.6  PROT 6.9  ALBUMIN 3.7     Radiology Studies: Dg Chest 2 View  Result Date: 06/08/2017 CLINICAL DATA:  Increasing shortness of breath and cough. EXAM: CHEST - 2 VIEW COMPARISON:  Chest x-ray dated January 07, 2017. FINDINGS: The heart size and mediastinal contours are within normal limits. Normal pulmonary vascularity. The lungs remain hyperinflated with emphysematous changes. Scarring in the right upper lobe is unchanged. No focal consolidation, pleural effusion, or pneumothorax. No acute osseous abnormality. IMPRESSION: COPD.  No active cardiopulmonary disease. Electronically Signed   By: Matthew Morrison M.D.   On: 06/08/2017 08:27     Medications:  Scheduled: . budesonide (PULMICORT) nebulizer solution  0.5 mg Nebulization BID  . enoxaparin (LOVENOX) injection  40 mg Subcutaneous Q24H  .  ipratropium-albuterol  3 mL Nebulization QID  . levofloxacin  500 mg Oral Daily  . methylPREDNISolone (SOLU-MEDROL) injection  40 mg Intravenous Q6H  . pantoprazole  40 mg Oral Daily   Continuous: . sodium chloride 50 mL/hr at 06/09/17 0944   NWG:[id number]:acetaminophen **OR** acetaminophen, albuterol, hydrOXYzine, ondansetron **OR** ondansetron (ZOFRAN) IV, polyethylene glycol, traMADol  Assessment/Plan:  Active Problems:   COPD GOLD IV   Hypercholesterolemia   COPD with acute exacerbation (HCC)    Acute COPD  exacerbation with acute respiratory failure with hypoxia Likely in the setting of viral URI.  Influenza PCR negative.  No infiltrates noted on chest x-ray.  However patient does have purulent expectoration.  Will initiate Levaquin.  Continue nebulizer treatments steroids.  Add nebulized budesonide.  Patient is followed by Dr. Sherene SiresWert with pulmonology.  He is on prednisone on a chronic basis.  There seems to be some compliance issues.  Essential hypertension Monitor blood pressures closely.  Not noted to be on any antihypertensive agents.  Normocytic anemia Drop in hemoglobin is likely dilutional.  No evidence for overt bleeding.  DVT Prophylaxis: Lovenox    Code Status: Full code Family Communication: Discussed with the patient Disposition Plan: Mobilize is as tolerated.  Wait for clinical improvement.    LOS: 0 days   Matthew ShipperGokul Bryanna Morrison  [facility] Pager [phone] 06/09/2017, 11:23 AM  If 7PM-7AM, please contact night-coverage at [email or web address], password [email or web address]

## 2017-06-10 DIAGNOSIS — J9621 Acute and chronic respiratory failure with hypoxia: Secondary | ICD-10-CM

## 2017-06-10 LAB — BLOOD GAS, ARTERIAL
ACID-BASE EXCESS: 4.1 mmol/L — AB (ref 0.0–2.0)
Bicarbonate: 31.4 mmol/L — ABNORMAL HIGH (ref 20.0–28.0)
DRAWN BY: 295031
O2 CONTENT: 3 L/min
O2 SAT: 93.6 %
PATIENT TEMPERATURE: 98.6
pCO2 arterial: 63.8 mmHg — ABNORMAL HIGH (ref 32.0–48.0)
pH, Arterial: 7.312 — ABNORMAL LOW (ref 7.350–7.450)
pO2, Arterial: 80.2 mmHg — ABNORMAL LOW (ref 83.0–108.0)

## 2017-06-10 MED ORDER — IPRATROPIUM-ALBUTEROL 0.5-2.5 (3) MG/3ML IN SOLN
3.0000 mL | RESPIRATORY_TRACT | Status: DC
  Administered 2017-06-10 – 2017-06-15 (×28): 3 mL via RESPIRATORY_TRACT
  Filled 2017-06-10 (×27): qty 3

## 2017-06-10 MED ORDER — METHYLPREDNISOLONE SODIUM SUCC 125 MG IJ SOLR
80.0000 mg | Freq: Four times a day (QID) | INTRAMUSCULAR | Status: DC
  Administered 2017-06-10 – 2017-06-12 (×9): 80 mg via INTRAVENOUS
  Filled 2017-06-10 (×9): qty 2

## 2017-06-10 MED ORDER — DM-GUAIFENESIN ER 30-600 MG PO TB12
1.0000 | ORAL_TABLET | Freq: Two times a day (BID) | ORAL | Status: DC
  Administered 2017-06-10 (×3): 1 via ORAL
  Filled 2017-06-10 (×3): qty 1

## 2017-06-10 NOTE — Progress Notes (Addendum)
[facility] PROGRESS NOTE  Matthew Morrison ZOX:[id number] DOB: 07-16-1955 DOA: 06/08/2017  PCP: Bing Neighbors, FNP  Brief History/Interval Summary: 62 year old African-American male with a past medical history of gold stage IV COPD, prior tobacco abuse, hypertension who is on chronic steroids at home presented with 3-day history of shortness of breath.  He was noted to have COPD exacerbation.  He was hospitalized for further management.  Also noted to be mildly hypoxic.  Reason for Visit: Acute COPD exacerbation  Consultants: None  Procedures: None  Antibiotics: Initiate Levaquin  Subjective/Interval History: Patient states that he did not have a good night secondary to shortness of breath and coughing episodes.  Feels like his wheezing has worsened this morning.  Denies any chest pain.    ROS: Denies any nausea or vomiting.  Objective:  Vital Signs  Vitals:   06/09/17 2031 06/10/17 0133 06/10/17 0505 06/10/17 0735  BP:   (!) 145/98   Pulse:   88   Resp:   18   Temp:   98.1 F (36.7 C)   TempSrc:   Oral   SpO2: 95% 92% 99% 94%  Weight:      Height:        Intake/Output Summary (Last 24 hours) at 06/10/2017 0919 Last data filed at 06/09/2017 2241 Gross per 24 hour  Intake 2772 ml  Output -  Net 2772 ml   Filed Weights   06/08/17 1732  Weight: 58.4 kg (128 lb 12 oz)    General appearance: Awake alert.  In no distress. Resp: Noted to be tachypneic.  Diminished air entry bilaterally with wheezing heard throughout both lung fields.  Some use of accessory muscles at times.  Few crackles at the bases.   Cardio: S1-S2 is normal regular.  No S3-S4.  No rubs murmurs of bruit GI: Abdomen is soft.  Nontender nondistended.  Bowel sounds are present.  No masses organomegaly Extremities: No edema Neurologic: No obvious focal neurological deficits  Lab Results:  Data Reviewed: I have personally reviewed following labs and imaging studies  CBC: Recent Labs  Lab  06/08/17 1237 06/09/17 0646  WBC 6.7 4.2  NEUTROABS 4.4  --   HGB 12.3* 10.3*  HCT 38.4* 33.2*  MCV 75.1* 76.7*  PLT 284 267    Basic Metabolic Panel: Recent Labs  Lab 06/08/17 1237 06/09/17 0646  NA 143 140  K 3.6 4.9  CL 102 107  CO2 31 30  GLUCOSE 114* 132*  BUN 8 12  CREATININE 0.94 0.65  CALCIUM 9.3 8.4*    GFR: Estimated Creatinine Clearance: 82.1 mL/min (by C-G formula based on SCr of 0.65 mg/dL).  Liver Function Tests: Recent Labs  Lab 06/08/17 1237  AST 23  ALT 20  ALKPHOS 54  BILITOT 0.6  PROT 6.9  ALBUMIN 3.7     Radiology Studies: No results found.   Medications:  Scheduled: . budesonide (PULMICORT) nebulizer solution  0.5 mg Nebulization BID  . dextromethorphan-guaiFENesin  1 tablet Oral BID  . enoxaparin (LOVENOX) injection  40 mg Subcutaneous Q24H  . feeding supplement (ENSURE ENLIVE)  237 mL Oral TID BM  . ipratropium-albuterol  3 mL Nebulization QID  . levofloxacin  500 mg Oral Daily  . methylPREDNISolone (SOLU-MEDROL) injection  80 mg Intravenous Q6H  . pantoprazole  40 mg Oral Daily   Continuous:  WJX:[id number] **OR** acetaminophen, albuterol, hydrOXYzine, ondansetron **OR** ondansetron (ZOFRAN) IV, polyethylene glycol, traMADol  Assessment/Plan:  Active Problems:   COPD GOLD IV   Hypercholesterolemia  COPD with acute exacerbation (HCC)   Acute exacerbation of chronic obstructive pulmonary disease (COPD) (HCC)    Acute COPD exacerbation with acute respiratory failure with hypoxia Likely in the setting of viral URI.  Influenza PCR negative.  No infiltrates noted on chest x-ray.  Patient did have purulent expectoration so he was placed on antibiotics.  Patient feels worse this morning.  He will be given additional nebulizer treatment.  He is dose of steroids will be increased.  Patient does not show improvement in ABG will be ordered.  Continue to monitor closely.  Continue nebulized budesonide.  Patient is followed by Dr.  Sherene SiresWert with pulmonology.  He is on prednisone on a chronic basis.  There seems to be some compliance issues.  Essential hypertension Monitor blood pressures closely.  Not noted to be on any antihypertensive agents.  Normocytic anemia Drop in hemoglobin is likely dilutional.  No evidence for overt bleeding.  ADDENDUM Called by RN that patient with increased SOB. Patient seen at bedside. He was feeling very anxious. He was give neb rx and anxiolytic. ABG done and not much worse than before. Per RN patient improved and sleeping. If he continues to have difficulty breathing he will need bipap which patient wants to avoid as much as possible. Continue to monitor.  DVT Prophylaxis: Lovenox    Code Status: Full code Family Communication: Discussed with the patient Disposition Plan: Mobilize is as tolerated.  Wait for clinical improvement.    LOS: 1 day   Matthew Morrison  [facility] Pager [phone] 06/10/2017, 9:19 AM  If 7PM-7AM, please contact night-coverage at [email or web address], password [email or web address]

## 2017-06-11 DIAGNOSIS — I10 Essential (primary) hypertension: Secondary | ICD-10-CM

## 2017-06-11 DIAGNOSIS — F411 Generalized anxiety disorder: Secondary | ICD-10-CM

## 2017-06-11 MED ORDER — HYDROCOD POLST-CPM POLST ER 10-8 MG/5ML PO SUER
5.0000 mL | Freq: Two times a day (BID) | ORAL | Status: DC | PRN
Start: 2017-06-11 — End: 2017-06-12
  Administered 2017-06-11 (×2): 5 mL via ORAL
  Filled 2017-06-11 (×2): qty 5

## 2017-06-11 MED ORDER — BENZONATATE 100 MG PO CAPS
100.0000 mg | ORAL_CAPSULE | Freq: Three times a day (TID) | ORAL | Status: DC
  Administered 2017-06-11 – 2017-06-20 (×29): 100 mg via ORAL
  Filled 2017-06-11 (×29): qty 1

## 2017-06-11 NOTE — Progress Notes (Addendum)
[facility] PROGRESS NOTE  Kathrynn SpeedDana Wessling FAO:[id number] DOB: 12-07-1955 DOA: 06/08/2017  PCP: Bing NeighborsHarris, Kimberly S, FNP  Brief History/Interval Summary: 62 year old African-American male with a past medical history of gold stage IV COPD, prior tobacco abuse, hypertension who is on chronic steroids at home presented with 3-day history of shortness of breath.  He was noted to have COPD exacerbation.  He was hospitalized for further management.  Also noted to be mildly hypoxic.  Reason for Visit: Acute COPD exacerbation  Consultants: None  Procedures: None  Antibiotics: Initiate Levaquin  Subjective/Interval History: Patient states that he feels slightly better.  He had a lot of difficulty with wheezing and cough yesterday evening.  His cough is now dry.  Denies any chest pain.      ROS: Denies any nausea or vomiting  Objective:  Vital Signs  Vitals:   06/10/17 2333 06/11/17 0401 06/11/17 0407 06/11/17 0852  BP:   (!) 149/95   Pulse:   (!) 107   Resp:   14   Temp:   98 F (36.7 C)   TempSrc:   Oral   SpO2: 96% 96% 99% 94%  Weight:      Height:       No intake or output data in the 24 hours ending 06/11/17 0920 Filed Weights   06/08/17 1732  Weight: 58.4 kg (128 lb 12 oz)    General appearance: Awake and alert.  Anxious. Resp: Noted to be tachypneic.  Continues to have diffuse wheezing bilaterally with few crackles at the bases.    Cardio: S1-S2 is mildly tachycardic regular.  No S3-S4. GI: Abdomen remains soft.  Nontender nondistended. Extremities: No edema Neurologic: No focal neurological deficits.   Lab Results:  Data Reviewed: I have personally reviewed following labs and imaging studies  CBC: Recent Labs  Lab 06/08/17 1237 06/09/17 0646  WBC 6.7 4.2  NEUTROABS 4.4  --   HGB 12.3* 10.3*  HCT 38.4* 33.2*  MCV 75.1* 76.7*  PLT 284 267    Basic Metabolic Panel: Recent Labs  Lab 06/08/17 1237 06/09/17 0646  NA 143 140  K 3.6 4.9  CL 102 107    CO2 31 30  GLUCOSE 114* 132*  BUN 8 12  CREATININE 0.94 0.65  CALCIUM 9.3 8.4*    GFR: Estimated Creatinine Clearance: 82.1 mL/min (by C-G formula based on SCr of 0.65 mg/dL).  Liver Function Tests: Recent Labs  Lab 06/08/17 1237  AST 23  ALT 20  ALKPHOS 54  BILITOT 0.6  PROT 6.9  ALBUMIN 3.7     Radiology Studies: No results found.   Medications:  Scheduled: . benzonatate  100 mg Oral TID  . budesonide (PULMICORT) nebulizer solution  0.5 mg Nebulization BID  . enoxaparin (LOVENOX) injection  40 mg Subcutaneous Q24H  . feeding supplement (ENSURE ENLIVE)  237 mL Oral TID BM  . ipratropium-albuterol  3 mL Nebulization Q4H  . levofloxacin  500 mg Oral Daily  . methylPREDNISolone (SOLU-MEDROL) injection  80 mg Intravenous Q6H  . pantoprazole  40 mg Oral Daily   Continuous:  ONG:[id number]:acetaminophen **OR** acetaminophen, albuterol, chlorpheniramine-HYDROcodone, hydrOXYzine, ondansetron **OR** ondansetron (ZOFRAN) IV, polyethylene glycol, traMADol  Assessment/Plan:  Active Problems:   COPD GOLD IV   Hypercholesterolemia   COPD with acute exacerbation (HCC)   Acute exacerbation of chronic obstructive pulmonary disease (COPD) (HCC)    Acute COPD exacerbation with acute respiratory failure with hypoxia and hypercapnea Likely in the setting of viral URI.  Influenza PCR negative.  No infiltrates noted on chest x-ray.  Patient did have purulent expectoration so he was placed on antibiotics.  Patient very slow to improve.  He actually had a setback yesterday evening with worsening wheezing.  Frequency of nebulizer treatment was increased.  Steroid dose was increased.  Continue with nebulized budesonide.  ABG was repeated and does not show it to be any worse compared to previous.  But he does feel slightly better today.  Continue current treatment for now.  Add antitussive agents.  Patient is followed by Dr. Sherene Sires with pulmonology.  He is on prednisone on a chronic basis.  There  seems to be some compliance issues.  Essential hypertension Blood pressure tends to climb whenever he has anxiety and respiratory issues.  Continue to monitor for now.  Anxiety Patient reports anxiety at baseline.  Not noted to be on any maintenance medication.  He does take hydroxyzine as needed.  We will need to consider a long-acting medication for same.  Normocytic anemia Drop in hemoglobin is likely dilutional.  No evidence for overt bleeding.  Recheck labs tomorrow.  DVT Prophylaxis: Lovenox    Code Status: Full code Family Communication: Discussed with the patient Disposition Plan: Slow to improve.  Continue management as outlined above.    LOS: 2 days   [name]  [facility] Pager [phone] 06/11/2017, 9:20 AM  If 7PM-7AM, please contact night-coverage at [email or web address], password [facility]

## 2017-06-11 NOTE — Congregational Nurse Program (Signed)
Congregational Nurse Program Note  Date of Encounter: 05/30/2017  Past Medical History: Past Medical History:  Diagnosis Date  . Asthma   . Emphysema lung (HCC)   . Hypertension     Encounter Details: CNP Questionnaire - 05/30/17 0930      Questionnaire   Patient Status  Not Applicable    Race  Black or African American    Location Patient Served At  [employer]    Insurance  Medicaid    Uninsured  Not Applicable    Food  Within past 12 months, worried food would run out with no money to buy more;Yes, have food insecurities    Housing/Utilities  No permanent housing    Transportation  No transportation needs    Interpersonal Safety  No, do not feel physically and emotionally safe where you currently live    Medication  Yes, have medication insecurities;Provided medication assistance    Medical Provider  Yes    Referrals  Medication Assistance    ED Visit Averted  Not Applicable    Life-Saving Intervention Made  Not Applicable      Client currently needs stiolto and pepcid refills. He does not have resources for co-pays. CN will contact provider to have medication transferred to have program pick up co-pays.

## 2017-06-12 ENCOUNTER — Inpatient Hospital Stay (HOSPITAL_COMMUNITY): Payer: Medicaid Other

## 2017-06-12 DIAGNOSIS — J441 Chronic obstructive pulmonary disease with (acute) exacerbation: Principal | ICD-10-CM

## 2017-06-12 DIAGNOSIS — J9602 Acute respiratory failure with hypercapnia: Secondary | ICD-10-CM

## 2017-06-12 DIAGNOSIS — F419 Anxiety disorder, unspecified: Secondary | ICD-10-CM

## 2017-06-12 LAB — CBC
HCT: 36.1 % — ABNORMAL LOW (ref 39.0–52.0)
HEMOGLOBIN: 11.3 g/dL — AB (ref 13.0–17.0)
MCH: 23.7 pg — ABNORMAL LOW (ref 26.0–34.0)
MCHC: 31.3 g/dL (ref 30.0–36.0)
MCV: 75.8 fL — ABNORMAL LOW (ref 78.0–100.0)
Platelets: 301 10*3/uL (ref 150–400)
RBC: 4.76 MIL/uL (ref 4.22–5.81)
RDW: 13.8 % (ref 11.5–15.5)
WBC: 6.6 10*3/uL (ref 4.0–10.5)

## 2017-06-12 LAB — BASIC METABOLIC PANEL
Anion gap: 8 (ref 5–15)
BUN: 17 mg/dL (ref 6–20)
CALCIUM: 8.8 mg/dL — AB (ref 8.9–10.3)
CHLORIDE: 94 mmol/L — AB (ref 101–111)
CO2: 35 mmol/L — ABNORMAL HIGH (ref 22–32)
CREATININE: 0.67 mg/dL (ref 0.61–1.24)
GFR calc Af Amer: >60 mL/min (ref >60)
GFR calc non Af Amer: >60 mL/min (ref >60)
Glucose, Bld: 134 mg/dL — ABNORMAL HIGH (ref 65–99)
Potassium: 4.2 mmol/L (ref 3.5–5.1)
SODIUM: 137 mmol/L (ref 135–145)

## 2017-06-12 MED ORDER — METHYLPREDNISOLONE SODIUM SUCC 125 MG IJ SOLR
60.0000 mg | Freq: Four times a day (QID) | INTRAMUSCULAR | Status: DC
  Administered 2017-06-12 – 2017-06-18 (×23): 60 mg via INTRAVENOUS
  Filled 2017-06-12 (×23): qty 2

## 2017-06-12 MED ORDER — HYDROCOD POLST-CPM POLST ER 10-8 MG/5ML PO SUER
5.0000 mL | Freq: Two times a day (BID) | ORAL | Status: DC
  Administered 2017-06-12 – 2017-06-13 (×2): 5 mL via ORAL
  Filled 2017-06-12 (×2): qty 5

## 2017-06-12 MED ORDER — FLUTICASONE PROPIONATE 50 MCG/ACT NA SUSP
2.0000 | Freq: Two times a day (BID) | NASAL | Status: DC
  Administered 2017-06-12 – 2017-06-20 (×17): 2 via NASAL
  Filled 2017-06-12: qty 16

## 2017-06-12 MED ORDER — AZELASTINE HCL 0.1 % NA SOLN
2.0000 | Freq: Two times a day (BID) | NASAL | Status: DC
  Administered 2017-06-12 – 2017-06-20 (×17): 2 via NASAL
  Filled 2017-06-12: qty 30

## 2017-06-12 MED ORDER — PANTOPRAZOLE SODIUM 40 MG IV SOLR
40.0000 mg | Freq: Two times a day (BID) | INTRAVENOUS | Status: DC
  Administered 2017-06-12 – 2017-06-18 (×12): 40 mg via INTRAVENOUS
  Filled 2017-06-12 (×12): qty 40

## 2017-06-12 NOTE — Consult Note (Signed)
Name: Kathrynn SpeedDana Sulkowski MRN: [id number] DOB: Jun 03, 1955    ADMISSION DATE:  06/08/2017 CONSULTATION DATE:  3/11  REFERRING MD :  Rito EhrlichKrishnan  CHIEF COMPLAINT:  Slow to resolve AECOPD   BRIEF PATIENT DESCRIPTION:  This is a 62 year old patient followed by Dr. Sherene SiresWert with a significant history of gold IV COPD (last FEV1 25%), stopped smoking over a year ago.  Was admitted on 3/7 with approximately 3-4-day history of runny nose, nasal and sinus congestion, sore throat, cough, initially productive of green sputum then nonproductive, and associated with wheezing and worsening shortness of breath.  He was initially using his rescue albuterol nebulizer and MDI however he ran out of his  rescue therapies and therefore presented to the emergency room for further therapy.  In the emergency room his chest x-ray was clear he was admitted with a working diagnosis of acute exacerbation chronic obstructive pulmonary disease.  Is been treated in usual fashion this is included: Systemic IV steroids Supplemental oxygen,, scheduled bronchodilators, empiric antibiotics, and as needed cough suppression.  He feels as though he has made little to no improvement since admission, his primary complaint at this point is ongoing cough which is nonproductive in nature but results in significant shortness of breath from which she finds quite difficult to recover from.  He continues to have postnasal drip, and has started to have some complaint of heartburn/reflux.  Pulmonary has been asked to see based on his lack of improvement to standard therapies. SIGNIFICANT EVENTS    STUDIES:     HISTORY OF PRESENT ILLNESS: See above  PAST MEDICAL HISTORY :   has a past medical history of Asthma, Emphysema lung (HCC), and Hypertension.  has a past surgical history that includes Hernia repair. Prior to Admission medications   Medication Sig Start Date End Date Taking? Authorizing Provider  acetaminophen (TYLENOL) 500 MG tablet Take 1,000 mg  by mouth daily as needed for headache.   Yes [provider]  famotidine (PEPCID) 20 MG tablet Take 1 tablet (20 mg total) by mouth at bedtime. 06/05/17  Yes Bing NeighborsHarris, Kimberly S, FNP  hydrOXYzine (ATARAX/VISTARIL) 10 MG tablet TAKE 1 TABLET BY MOUTH 3 TIMES DAILY AS NEEDED FOR ANXIETY. 05/22/17  Yes Bing NeighborsHarris, Kimberly S, FNP  ipratropium-albuterol (DUONEB) 0.5-2.5 (3) MG/3ML SOLN Take 3 mLs by nebulization every 6 (six) hours as needed. 06/07/17  Yes Bing NeighborsHarris, Kimberly S, FNP  omeprazole (PRILOSEC) 40 MG capsule Take 1 capsule (40 mg total) by mouth daily. 05/22/17  Yes Bing NeighborsHarris, Kimberly S, FNP  OXYGEN 2 lpm with sleep and occ during the day   Yes [provider]  predniSONE (DELTASONE) 20 MG tablet Take 1 tablet (20 mg total) by mouth daily with breakfast. 05/22/17  Yes Bing NeighborsHarris, Kimberly S, FNP  PROVENTIL HFA 108 [phone](90 Base) MCG/ACT inhaler Inhale 2 puffs into the lungs every 4 (four) hours as needed for wheezing or shortness of breath. 05/22/17  Yes Bing NeighborsHarris, Kimberly S, FNP  Tiotropium Bromide-Olodaterol (STIOLTO RESPIMAT) 2.5-2.5 MCG/ACT AERS Inhale 2 puffs into the lungs daily. 06/05/17  Yes Bing NeighborsHarris, Kimberly S, FNP  acetaminophen-codeine (TYLENOL #3) 300-30 MG tablet Take 1 tablet by mouth every 4 (four) hours as needed for severe pain. Patient not taking: Reported on 06/08/2017 05/10/17   Bing NeighborsHarris, Kimberly S, FNP  budesonide (PULMICORT) 0.25 MG/2ML nebulizer solution Take 2 mLs (0.25 mg total) by nebulization 2 (two) times daily. Patient not taking: Reported on 06/08/2017 06/06/17 01/02/18  Bing NeighborsHarris, Kimberly S, FNP  mupirocin cream (BACTROBAN) 2 %  Apply topically 2 (two) times daily. Patient not taking: Reported on 06/08/2017 11/03/16   Onnie Boer, MD  polyethylene glycol (MIRALAX) packet Take 17 g by mouth 2 (two) times daily. Patient not taking: Reported on 06/08/2017 05/22/17   Bing Neighbors, FNP   Allergies  Allergen Reactions  . Norflex [Orphenadrine] Hives    FAMILY HISTORY:  family  history includes Depression in his mother; Stroke in his father. SOCIAL HISTORY:  reports that he quit smoking about 9 months ago. His smoking use included cigarettes. He has a 40.00 pack-year smoking history. he has never used smokeless tobacco. He reports that he does not drink alcohol or use drugs.  REVIEW OF SYSTEMS red highlights are positive:   Constitutional: Negative for fever, chills, weight loss, malaise/fatigue and diaphoresis.  HENT: Negative for hearing loss, ear pain, nosebleeds, congestion, sore throat, he associates w/ his cough, + post nasal gtt and congested phonation, neck pain, tinnitus and ear discharge.   Eyes: Negative for blurred vision, double vision, photophobia, pain, discharge and redness.  Respiratory: +cough,- hemoptysis, -sputum production,+ shortness of breath, wheezing especially after coughing and - stridor.   Cardiovascular: Negative for chest pain, palpitations, orthopnea, claudication, leg swelling and PND.  Gastrointestinal: +heartburn, nausea, vomiting, abdominal pain, diarrhea, constipation, blood in stool and melena.  Genitourinary: Negative for dysuria, urgency, frequency, hematuria and flank pain.  Musculoskeletal: Negative for myalgias, back pain, joint pain and falls.  Skin: Negative for itching and rash.  Neurological: Negative for dizziness, tingling, tremors, sensory change, speech change, focal weakness, seizures, loss of consciousness, weakness and headaches.  Endo/Heme/Allergies: Negative for environmental allergies and polydipsia. Does not bruise/bleed easily.  SUBJECTIVE:  Still feels weak, tired and short of breath VITAL SIGNS: Temp:  [97.9 F (36.6 C)-98.2 F (36.8 C)] 97.9 F (36.6 C) (03/11 1124) Pulse Rate:  [110-121] 113 (03/11 1124) Resp:  [16-18] 18 (03/11 1124) BP: (129-175)/(72-114) 150/72 (03/11 1125) SpO2:  [92 %-96 %] 94 % (03/11 1125)  PHYSICAL EXAMINATION: General: Chronically ill-appearing 62 year old male currently  resting in bed no acute distress Neuro: Awake oriented no focal deficits HEENT: Normocephalic atraumatic.  Mucous membranes are moist, posterior pharynx non-erythemic, no jugular venous distention, phonation is congested, positive upper airway wheezing Cardiovascular: Regular rate and rhythm Lungs: Distant expiratory wheeze, short of breath with exertion, no accessory muscle use at rest Abdomen: Soft nontender no organomegaly Musculoskeletal: Full strength and bulk Skin: Warm dry brisk cap refill strong pulses  Recent Labs  Lab 06/08/17 1237 06/09/17 0646 06/12/17 0412  NA 143 140 137  K 3.6 4.9 4.2  CL 102 107 94*  CO2 31 30 35*  BUN 8 12 17   CREATININE 0.94 0.65 0.67  GLUCOSE 114* 132* 134*   Recent Labs  Lab 06/08/17 1237 06/09/17 0646 06/12/17 0412  HGB 12.3* 10.3* 11.3*  HCT 38.4* 33.2* 36.1*  WBC 6.7 4.2 6.6  PLT 284 267 301   Dg Chest Port 1 View  Result Date: 06/12/2017 CLINICAL DATA:  Dyspnea.  History of asthma, hypertension, ex-smoker EXAM: PORTABLE CHEST 1 VIEW COMPARISON:  Chest x-rays dated 06/08/2017 and 01/07/2017 FINDINGS: Heart size and mediastinal contours are within normal limits. Lungs are hyperexpanded. Lungs are clear. No pleural effusion or pneumothorax seen. No acute or suspicious osseous finding. IMPRESSION: 1. No acute findings.  No evidence of pneumonia or pulmonary edema. 2. COPD. Electronically Signed   By: Bary Richard M.D.   On: 06/12/2017 09:29    ASSESSMENT / PLAN:  Acute exacerbation  chronic obstructive pulmonary disease Gold IV COPD  Acute bronchitis in setting of viral URI postnasal drip Post viral cough Gastroesophageal reflux disease   Discussion 62 year old male patient with recent URI infection and resultant acute exacerbation of underlying chronic obstructive pulmonary disease.  He continues to have significant cough which is hindering his sleep, resulting in significant exertional dyspnea, and is thus the largest component of  his symptom burden.  He has typical findings which contribute to this including ongoing postnasal drip and now worsening reflux both which can exacerbate cough.  Although post viral cough can take weeks to subside, alleviating exacerbating symptoms such as the above may help [place] to improve his symptoms  Plan/rec Continue scheduled bronchodilators Continue systemic steroids Change Tussionex to scheduled every 12, hoping that this scheduled H2 blockade will help with his nasal congestion, and hydrocodone relieve cough Also add nasal antihistamine and nasal steroid Increased scheduled acid blockade given on-going symptoms Flutter valve Complete 5 days of antibiotics  Simonne Martinet ACNP-BC [facility] Pulmonary/Critical Care Pager # [phone] OR # [phone] if no answer    06/12/2017, 2:05 PM

## 2017-06-12 NOTE — Progress Notes (Signed)
[facility] PROGRESS NOTE  Matthew Morrison ZOX:[id number] DOB: 1955/10/27 DOA: 06/08/2017  PCP: Matthew Neighbors, FNP  Brief History/Interval Summary: 62 year old African-American male with a past medical history of gold stage IV COPD, prior tobacco abuse, hypertension who is on chronic steroids at home presented with 3-day history of shortness of breath.  He was noted to have COPD exacerbation.  He was hospitalized for further management.  Also noted to be mildly hypoxic.  Reason for Visit: Acute COPD exacerbation  Consultants: None  Procedures: None  Antibiotics: Levaquin  Subjective/Interval History: Patient states that he had a rough night.  Does not report any improvement in his shortness of breath.  Denies any chest pain.  Continues to have a cough which is dry.      ROS: Denies any nausea or vomiting.  Objective:  Vital Signs  Vitals:   06/11/17 2111 06/12/17 0018 06/12/17 0328 06/12/17 0444  BP: (!) 147/104   (!) 135/94  Pulse:    (!) 110  Resp:    18  Temp:    98 F (36.7 C)  TempSrc:    Oral  SpO2:  96% 93% 94%  Weight:      Height:        Intake/Output Summary (Last 24 hours) at 06/12/2017 8119 Last data filed at 06/11/2017 2233 Gross per 24 hour  Intake 237 ml  Output -  Net 237 ml   Filed Weights   06/08/17 1732  Weight: 58.4 kg (128 lb 12 oz)    General appearance: Awake alert.  Anxious. Resp: Tachypneic at rest.  Mild use of accessory muscles.  Continues to have poor air entry bilaterally with wheezing.  Wheezing appears to be slightly less compared to yesterday.  Few crackles at the bases.     Cardio: S1-S2 is tachycardic regular.  No S3-S4. GI: Abdomen remains soft.  Nontender nondistended. Extremities: No edema Neurologic: No focal neurological deficits.   Lab Results:  Data Reviewed: I have personally reviewed following labs and imaging studies  CBC: Recent Labs  Lab 06/08/17 1237 06/09/17 0646 06/12/17 0412  WBC 6.7 4.2 6.6    NEUTROABS 4.4  --   --   HGB 12.3* 10.3* 11.3*  HCT 38.4* 33.2* 36.1*  MCV 75.1* 76.7* 75.8*  PLT 284 267 301    Basic Metabolic Panel: Recent Labs  Lab 06/08/17 1237 06/09/17 0646 06/12/17 0412  NA 143 140 137  K 3.6 4.9 4.2  CL 102 107 94*  CO2 31 30 35*  GLUCOSE 114* 132* 134*  BUN 8 12 17   CREATININE 0.94 0.65 0.67  CALCIUM 9.3 8.4* 8.8*    GFR: Estimated Creatinine Clearance: 82.1 mL/min (by C-G formula based on SCr of 0.67 mg/dL).  Liver Function Tests: Recent Labs  Lab 06/08/17 1237  AST 23  ALT 20  ALKPHOS 54  BILITOT 0.6  PROT 6.9  ALBUMIN 3.7     Radiology Studies: No results found.   Medications:  Scheduled: . benzonatate  100 mg Oral TID  . budesonide (PULMICORT) nebulizer solution  0.5 mg Nebulization BID  . enoxaparin (LOVENOX) injection  40 mg Subcutaneous Q24H  . feeding supplement (ENSURE ENLIVE)  237 mL Oral TID BM  . ipratropium-albuterol  3 mL Nebulization Q4H  . levofloxacin  500 mg Oral Daily  . methylPREDNISolone (SOLU-MEDROL) injection  80 mg Intravenous Q6H  . pantoprazole  40 mg Oral Daily   Continuous:  JYN:[id number] **OR** acetaminophen, albuterol, chlorpheniramine-HYDROcodone, hydrOXYzine, ondansetron **OR** ondansetron (ZOFRAN) IV,  polyethylene glycol, traMADol  Assessment/Plan:  Active Problems:   COPD GOLD IV   Hypercholesterolemia   COPD with acute exacerbation (HCC)   Acute exacerbation of chronic obstructive pulmonary disease (COPD) (HCC)    Acute COPD exacerbation with acute respiratory failure with hypoxia and hypercapnea Likely in the setting of viral URI.  Influenza PCR negative.  No infiltrates noted on chest x-ray.  Patient did have purulent expectoration so he was placed on antibiotics.  Patient was placed also on nebulized budesonide.  His systemic steroid dose was also increased.  Antitussive agents were added.  ABG was repeated and does show element of respiratory acidosis but not much worse  compared to before.  Chest x-ray will be repeated today.  We will consult pulmonology as the patient is not improving with current treatment.  He is followed by pulmonology as an outpatient, Dr. Sherene SiresWert.  He is on prednisone on a chronic basis.  Essential hypertension Blood pressure tends to climb whenever he has anxiety and respiratory issues.  Currently stable.  Continue to monitor for now.  Anxiety Patient reports anxiety at baseline.  Not noted to be on any maintenance medication.  He does take hydroxyzine as needed.  We will need to consider a long-acting medication for same.  Normocytic anemia Drop in hemoglobin is likely dilutional.  Hemoglobin is stable.  No evidence for overt bleeding.  DVT Prophylaxis: Lovenox    Code Status: Full code Family Communication: Discussed with the patient Disposition Plan: Slow to improve.  Continue management as outlined above.    LOS: 3 days   [name]  [facility] Pager [phone] 06/12/2017, 8:21 AM  If 7PM-7AM, please contact night-coverage at [email or web address], password [email or web address]

## 2017-06-13 DIAGNOSIS — J9602 Acute respiratory failure with hypercapnia: Secondary | ICD-10-CM

## 2017-06-13 DIAGNOSIS — J9601 Acute respiratory failure with hypoxia: Secondary | ICD-10-CM

## 2017-06-13 MED ORDER — MORPHINE SULFATE 15 MG PO TABS
7.5000 mg | ORAL_TABLET | Freq: Two times a day (BID) | ORAL | Status: DC
  Administered 2017-06-13 – 2017-06-15 (×4): 7.5 mg via ORAL
  Filled 2017-06-13 (×5): qty 1

## 2017-06-13 MED ORDER — AMLODIPINE BESYLATE 5 MG PO TABS
5.0000 mg | ORAL_TABLET | Freq: Every day | ORAL | Status: DC
  Administered 2017-06-13 – 2017-06-20 (×8): 5 mg via ORAL
  Filled 2017-06-13 (×8): qty 1

## 2017-06-13 MED ORDER — HYDROCOD POLST-CPM POLST ER 10-8 MG/5ML PO SUER: 5.0000 mL | Freq: Two times a day (BID) | ORAL | Status: DC | PRN

## 2017-06-13 NOTE — Progress Notes (Signed)
  62 year old with severe COPD, FEV1 0.62-25% 10/2016 admitted 3/7 with symptoms of a chest cold and COPD exacerbation treated with IV steroids and bronchodilators, remains on 2 L oxygen which is his baseline home requirement.  He continues to have paroxysms of cough and remains dyspneic on minimal exertion and even at rest I note that on his prior admission low-dose morphine was used for persistent dyspnea  On exam-accessory muscle usage, diffuse fine scattered rhonchi, no edema or JVD  Impression/plan Acute on chronic respiratory failure with COPD exacerbation -Continue IV Solu-Medrol and duo nebs - I discussed with him that we do not have many other interventions for end-stage COPD.  Low-dose morphine has provided him relief in the past and he is agreeable to try this.  We will try 7.5 mg twice daily and titrate.  I discussed the risk of respiratory depression but at this point he just wants some relief from his dyspnea and cough  Cyril Mourningakesh Russie Gulledge MD. Tonny BollmanFCCP. [facility] Pager [phone] If no response call [phone]   06/13/2017

## 2017-06-13 NOTE — Progress Notes (Addendum)
[facility] PROGRESS NOTE  Kathrynn SpeedDana Andaya WUJ:[id number] DOB: 1955-09-10 DOA: 06/08/2017  PCP: Bing NeighborsHarris, Kimberly S, FNP  Brief History/Interval Summary: 62 year old African-American male with a past medical history of gold stage IV COPD, prior tobacco abuse, hypertension who is on chronic steroids at home presented with 3-day history of shortness of breath.  He was noted to have COPD exacerbation.  He was hospitalized for further management.  He was also noted to be mildly hypoxic.  Patient has been slow to improve despite maximal treatment.  Pulmonology was consulted.  Reason for Visit: Acute COPD exacerbation  Consultants: Pulmonology  Procedures: None  Antibiotics: Levaquin 3/8  Subjective/Interval History: Patient states that he is only minimally better.  Patient continues to have cough which is dry.  Has not noted any significant improvement in his cough.  Continues to have wheezing.  Slept slightly better last night compared to previous nights.     ROS: Denies nausea vomiting.  Objective:  Vital Signs  Vitals:   06/12/17 2111 06/13/17 0035 06/13/17 0435 06/13/17 0458  BP:   140/84   Pulse:   (!) 110   Resp:   18   Temp:   98.6 F (37 C)   TempSrc:   Oral   SpO2: 90% 92% 96% 93%  Weight:      Height:        Intake/Output Summary (Last 24 hours) at 06/13/2017 0855 Last data filed at 06/12/2017 1100 Gross per 24 hour  Intake 240 ml  Output -  Net 240 ml   Filed Weights   06/08/17 1732  Weight: 58.4 kg (128 lb 12 oz)    General appearance: Awake alert.'s seems less anxious today.  No distress. Resp: Tachypneic at rest.  Although appears to be slightly better compared to yesterday.  However still has wheezing bilaterally with poor air entry.  Few crackles at the bases.      Cardio: S1-S2 is tachycardic regular.  No S3-S4.  No rubs murmurs of bruit GI: Abdomen soft.  Nontender nondistended Extremities: No edema Neurologic: No focal neurological  deficits   Lab Results:  Data Reviewed: I have personally reviewed following labs and imaging studies  CBC: Recent Labs  Lab 06/08/17 1237 06/09/17 0646 06/12/17 0412  WBC 6.7 4.2 6.6  NEUTROABS 4.4  --   --   HGB 12.3* 10.3* 11.3*  HCT 38.4* 33.2* 36.1*  MCV 75.1* 76.7* 75.8*  PLT 284 267 301    Basic Metabolic Panel: Recent Labs  Lab 06/08/17 1237 06/09/17 0646 06/12/17 0412  NA 143 140 137  K 3.6 4.9 4.2  CL 102 107 94*  CO2 31 30 35*  GLUCOSE 114* 132* 134*  BUN 8 12 17   CREATININE 0.94 0.65 0.67  CALCIUM 9.3 8.4* 8.8*    GFR: Estimated Creatinine Clearance: 82.1 mL/min (by C-G formula based on SCr of 0.67 mg/dL).  Liver Function Tests: Recent Labs  Lab 06/08/17 1237  AST 23  ALT 20  ALKPHOS 54  BILITOT 0.6  PROT 6.9  ALBUMIN 3.7     Radiology Studies: Dg Chest Port 1 View  Result Date: 06/12/2017 CLINICAL DATA:  Dyspnea.  History of asthma, hypertension, ex-smoker EXAM: PORTABLE CHEST 1 VIEW COMPARISON:  Chest x-rays dated 06/08/2017 and 01/07/2017 FINDINGS: Heart size and mediastinal contours are within normal limits. Lungs are hyperexpanded. Lungs are clear. No pleural effusion or pneumothorax seen. No acute or suspicious osseous finding. IMPRESSION: 1. No acute findings.  No evidence of pneumonia or pulmonary  edema. 2. COPD. Electronically Signed   By: Bary Richard M.D.   On: 06/12/2017 09:29     Medications:  Scheduled: . azelastine  2 spray Each Nare BID  . benzonatate  100 mg Oral TID  . budesonide (PULMICORT) nebulizer solution  0.5 mg Nebulization BID  . chlorpheniramine-HYDROcodone  5 mL Oral Q12H  . enoxaparin (LOVENOX) injection  40 mg Subcutaneous Q24H  . feeding supplement (ENSURE ENLIVE)  237 mL Oral TID BM  . fluticasone  2 spray Each Nare BID  . ipratropium-albuterol  3 mL Nebulization Q4H  . levofloxacin  500 mg Oral Daily  . methylPREDNISolone (SOLU-MEDROL) injection  60 mg Intravenous Q6H  . pantoprazole (PROTONIX) IV   40 mg Intravenous Q12H   Continuous:  NWG:[id number] **OR** acetaminophen, albuterol, hydrOXYzine, ondansetron **OR** ondansetron (ZOFRAN) IV, polyethylene glycol, traMADol  Assessment/Plan:  Active Problems:   COPD GOLD IV   Hypercholesterolemia   COPD with acute exacerbation (HCC)   Acute exacerbation of chronic obstructive pulmonary disease (COPD) (HCC)    Acute COPD exacerbation with acute respiratory failure with hypoxia and hypercapnea Likely in the setting of viral URI.  Influenza PCR negative.  No infiltrates noted on chest x-ray.  Patient did have purulent expectoration so he was placed on antibiotics.  He will complete 5 days of Levaquin today.  Can be stopped after today.  Patient was placed also on nebulized budesonide.  He remains on systemic steroids intravenously.  He was placed on Tessalon as his coughing episodes was thought to impede his ability to rest.  Due to slow improvement pulmonology was consulted.  Appreciate their assistance.  He will has been placed on Tussionex.  PPI.  Flonase.  Chest x-ray repeated yesterday did not show any new process.  Previous ABGs reviewed. He is followed by pulmonology as an outpatient, Dr. Sherene Sires.  He is on prednisone on a chronic basis.  Essential hypertension Blood pressure tends to climb whenever he has anxiety and respiratory issues.  Currently stable.  Continue to monitor for now.  Not on any blood pressure lowering agents at home.  If blood pressure remains poorly controlled despite improvement in respiratory status we could consider initiating antihypertensives.  Anxiety Patient reports anxiety at baseline.  Not noted to be on any maintenance medication.  He does take hydroxyzine as needed.  We will need to consider a long-acting medication for same such as Wellbutrin or Lexapro.  Normocytic anemia Drop in hemoglobin is likely dilutional.  Hemoglobin is stable.  No evidence for overt bleeding.  DVT Prophylaxis: Lovenox    Code  Status: Full code Family Communication: Discussed with the patient Disposition Plan: Patient very slow to improve.  Pulmonology continue to follow.  Wait for clinical improvement.    LOS: 4 days   [name]  [facility] Pager [phone] 06/13/2017, 8:55 AM  If 7PM-7AM, please contact night-coverage at [email or web address], password [facility]

## 2017-06-14 MED ORDER — GUAIFENESIN ER 600 MG PO TB12
1200.0000 mg | ORAL_TABLET | Freq: Two times a day (BID) | ORAL | Status: DC
  Administered 2017-06-14 – 2017-06-20 (×13): 1200 mg via ORAL
  Filled 2017-06-14 (×13): qty 2

## 2017-06-14 NOTE — Progress Notes (Signed)
PROGRESS NOTE    Matthew Morrison   ZOX:[id number]  DOB: 1955/07/20  DOA: 06/08/2017 PCP: Bing Neighbors, FNP   Brief Narrative:  Matthew Morrison  62 year old African-American male with a past medical history of gold stage IV COPD, prior tobacco abuse, hypertension who is on chronic steroids at home presented with 3-day history of shortness of breath and is admitted for a COPD exacerbation.    Subjective: Continues to be quite short of breath with movement.  ROS: no complaints of nausea, vomiting, constipation diarrhea, cough, dyspnea or dysuria. No other complaints.   Assessment & Plan:   Active Problems:   Acute exacerbation of chronic obstructive pulmonary disease     Acute respiratory failure with hypoxia and hypercapnia     COPD GOLD IV - started smoking at age 31- no longer smokes - cont current treatment with Nebs, IV Steroids and Morphine BID for air hunger - added Mucinex - at baseline he cannot walk more than a minute or two before stopping - uses Prednisone 20 mg daily at baseline as well - cont O2  - he remains a full code - will ask for palliative care consult for goals of care  HTN - Amlodipine    DVT prophylaxis: Lovenox Code Status: Full code Family Communication:  Disposition Plan: home when stable Consultants:   PCCM Procedures:   none Antimicrobials:  Anti-infectives (From admission, onward)   Start     Dose/Rate Route Frequency Ordered Stop   06/09/17 1000  levofloxacin (LEVAQUIN) tablet 500 mg  Status:  Discontinued     500 mg Oral Daily 06/09/17 0852 06/13/17 1134       Objective: Vitals:   06/14/17 0756 06/14/17 1146 06/14/17 1319 06/14/17 1322  BP:   (!) 166/103 (!) 140/100  Pulse:   (!) 110   Resp: 18  18   Temp:   98.3 F (36.8 C)   TempSrc:   Oral   SpO2: 93% 95% 96%   Weight:      Height:       No intake or output data in the 24 hours ending 06/14/17 1433 Filed Weights   06/08/17 1732  Weight: 58.4 kg (128 lb 12 oz)     Examination: General exam: Appears comfortable  HEENT: PERRLA, oral mucosa moist, no sclera icterus or thrush Respiratory system: wheezing bilaterally-  resp rate normal- pulse ox 96% on 2 L Cardiovascular system: S1 & S2 heard, RRR.  tachycardia  Gastrointestinal system: Abdomen soft, non-tender, nondistended. Normal bowel sound. No organomegaly Central nervous system: Alert and oriented. No focal neurological deficits. Extremities: No cyanosis, clubbing or edema Skin: No rashes or ulcers Psychiatry:  Mood & affect appropriate.     Data Reviewed: I have personally reviewed following labs and imaging studies  CBC: Recent Labs  Lab 06/08/17 1237 06/09/17 0646 06/12/17 0412  WBC 6.7 4.2 6.6  NEUTROABS 4.4  --   --   HGB 12.3* 10.3* 11.3*  HCT 38.4* 33.2* 36.1*  MCV 75.1* 76.7* 75.8*  PLT 284 267 301   Basic Metabolic Panel: Recent Labs  Lab 06/08/17 1237 06/09/17 0646 06/12/17 0412  NA 143 140 137  K 3.6 4.9 4.2  CL 102 107 94*  CO2 31 30 35*  GLUCOSE 114* 132* 134*  BUN 8 12 17   CREATININE 0.94 0.65 0.67  CALCIUM 9.3 8.4* 8.8*   GFR: Estimated Creatinine Clearance: 82.1 mL/min (by C-G formula based on SCr of 0.67 mg/dL). Liver Function Tests: Recent Labs  Lab  06/08/17 1237  AST 23  ALT 20  ALKPHOS 54  BILITOT 0.6  PROT 6.9  ALBUMIN 3.7   No results for input(s): LIPASE, AMYLASE in the last 168 hours. No results for input(s): AMMONIA in the last 168 hours. Coagulation Profile: No results for input(s): INR, PROTIME in the last 168 hours. Cardiac Enzymes: No results for input(s): CKTOTAL, CKMB, CKMBINDEX, TROPONINI in the last 168 hours. BNP (last 3 results) No results for input(s): PROBNP in the last 8760 hours. HbA1C: No results for input(s): HGBA1C in the last 72 hours. CBG: No results for input(s): GLUCAP in the last 168 hours. Lipid Profile: No results for input(s): CHOL, HDL, LDLCALC, TRIG, CHOLHDL, LDLDIRECT in the last 72 hours. Thyroid  Function Tests: No results for input(s): TSH, T4TOTAL, FREET4, T3FREE, THYROIDAB in the last 72 hours. Anemia Panel: No results for input(s): VITAMINB12, FOLATE, FERRITIN, TIBC, IRON, RETICCTPCT in the last 72 hours. Urine analysis:    Component Value Date/Time   COLORURINE STRAW (A) 09/20/2016 1216   APPEARANCEUR CLEAR 09/20/2016 1216   LABSPEC 1.015 05/10/2017 1002   PHURINE 7.0 05/10/2017 1002   GLUCOSEU NEGATIVE 05/10/2017 1002   HGBUR NEGATIVE 05/10/2017 1002   BILIRUBINUR NEGATIVE 05/10/2017 1002   KETONESUR NEGATIVE 05/10/2017 1002   PROTEINUR NEGATIVE 05/10/2017 1002   UROBILINOGEN 0.2 05/10/2017 1002   NITRITE NEGATIVE 05/10/2017 1002   LEUKOCYTESUR NEGATIVE 05/10/2017 1002   Sepsis Labs: @LABRCNTIP (procalcitonin:4,lacticidven:4) )No results found for this or any previous visit (from the past 240 hour(s)).       Radiology Studies: No results found.    Scheduled Meds: . amLODipine  5 mg Oral Daily  . azelastine  2 spray Each Nare BID  . benzonatate  100 mg Oral TID  . budesonide (PULMICORT) nebulizer solution  0.5 mg Nebulization BID  . enoxaparin (LOVENOX) injection  40 mg Subcutaneous Q24H  . feeding supplement (ENSURE ENLIVE)  237 mL Oral TID BM  . fluticasone  2 spray Each Nare BID  . guaiFENesin  1,200 mg Oral BID  . ipratropium-albuterol  3 mL Nebulization Q4H  . methylPREDNISolone (SOLU-MEDROL) injection  60 mg Intravenous Q6H  . morphine  7.5 mg Oral BID  . pantoprazole (PROTONIX) IV  40 mg Intravenous Q12H   Continuous Infusions:   LOS: 5 days    Time spent in minutes: 35    Calvert CantorSaima Savoy [place], MD [facility] Pager: [email or web address] Password [email or web address] 06/14/2017, 2:33 PM

## 2017-06-15 DIAGNOSIS — J9600 Acute respiratory failure, unspecified whether with hypoxia or hypercapnia: Secondary | ICD-10-CM

## 2017-06-15 DIAGNOSIS — Z7189 Other specified counseling: Secondary | ICD-10-CM

## 2017-06-15 MED ORDER — IPRATROPIUM-ALBUTEROL 0.5-2.5 (3) MG/3ML IN SOLN
3.0000 mL | Freq: Four times a day (QID) | RESPIRATORY_TRACT | Status: DC
  Administered 2017-06-15 – 2017-06-20 (×22): 3 mL via RESPIRATORY_TRACT
  Filled 2017-06-15 (×21): qty 3

## 2017-06-15 MED ORDER — MORPHINE SULFATE 15 MG PO TABS
7.5000 mg | ORAL_TABLET | Freq: Three times a day (TID) | ORAL | Status: DC
  Administered 2017-06-15 – 2017-06-20 (×16): 7.5 mg via ORAL
  Filled 2017-06-15 (×16): qty 1

## 2017-06-15 NOTE — Progress Notes (Signed)
PT demonstrated hands on understanding of Flutter device- NPC at this time. 

## 2017-06-15 NOTE — Progress Notes (Signed)
  62 year old with severe COPD, FEV1 0.62-25% 10/2016 admitted 3/7 with  COPD exacerbation treated with IV steroids and bronchodilators, remains on 2 L oxygen which is his baseline home requirement.  He continues to have significant dyspnea including at rest, paroxysms of cough, feels "useless".  On exam-mild accessory muscle use, decrease scattered rhonchi, no edema or JVD.    Impression/plan Acute on chronic respiratory failure with COPD exacerbation -Continue IV Solu-Medrol and duo nebs -Morphine was added for intractable dyspnea and seems to have helped a little bit, will increase to 7.5 mg 3 times daily.  I discussed the risk of respiratory depression but at this point he just wants some relief from his dyspnea and cough  Need to place care limitations in this situation, he is not ready to decide on this yet  Matthew Mourningakesh Lutricia Widjaja MD. FCCP. [facility] Pager [phone] If no response call [phone]   06/15/2017

## 2017-06-15 NOTE — Progress Notes (Signed)
PROGRESS NOTE    Matthew Morrison   ZOX:[id number]  DOB: 03/05/1956  DOA: 06/08/2017 PCP: Bing Neighbors, FNP   Brief Narrative:  Matthew Morrison  62 year old African-American male with a past medical history of gold stage IV COPD, prior tobacco abuse, hypertension who is on chronic steroids at home presented with 3-day history of shortness of breath and is admitted for a COPD exacerbation.    Subjective: He states his dyspnea remains unchanged and he is still very short of breath. ROS: no complaints of nausea, vomiting, constipation diarrhea, cough, dyspnea or dysuria. No other complaints.   Assessment & Plan:   Active Problems:   Acute exacerbation of chronic obstructive pulmonary disease     Acute respiratory failure with hypoxia and hypercapnia     COPD GOLD IV - started smoking at age 74- no longer smokes - cont current treatment with Nebs, IV Steroids and Morphine BID for air hunger - added Mucinex - at baseline he cannot walk more than a minute or two before stopping - uses Prednisone 20 mg daily at baseline as well - cont O2  - he remains a full code - PCCM has increased the dose of MSIR today to 7.5 mg TID assist with dyspnea -  palliative care consult for goals of care is pending  HTN - Amlodipine    DVT prophylaxis: Lovenox Code Status: Full code Family Communication:  Disposition Plan: home when stable Consultants:   PCCM Procedures:   none Antimicrobials:  Anti-infectives (From admission, onward)   Start     Dose/Rate Route Frequency Ordered Stop   06/09/17 1000  levofloxacin (LEVAQUIN) tablet 500 mg  Status:  Discontinued     500 mg Oral Daily 06/09/17 0852 06/13/17 1134       Objective: Vitals:   06/15/17 0816 06/15/17 1436 06/15/17 1500 06/15/17 1516  BP:  (!) 152/102 (!) 157/114 (!) 154/95  Pulse:  (!) 118 (!) 114 (!) 116  Resp:  18    Temp:  98.2 F (36.8 C)    TempSrc:  Oral    SpO2: 91% 96%    Weight:      Height:        Intake/Output  Summary (Last 24 hours) at 06/15/2017 1517 Last data filed at 06/15/2017 0200 Gross per 24 hour  Intake 180 ml  Output -  Net 180 ml   Filed Weights   06/08/17 1732  Weight: 58.4 kg (128 lb 12 oz)    Examination: General exam: Appears comfortable  HEENT: PERRLA, oral mucosa moist, no sclera icterus or thrush Respiratory system: wheezing bilaterally-  resp rate normal- pulse ox 96% on 2 L Cardiovascular system: S1 & S2 heard, RRR.  tachycardia  Gastrointestinal system: Abdomen soft, non-tender, nondistended. Normal bowel sound. No organomegaly Central nervous system: Alert and oriented. No focal neurological deficits. Extremities: No cyanosis, clubbing or edema Skin: No rashes or ulcers Psychiatry:  Mood & affect appropriate.     Data Reviewed: I have personally reviewed following labs and imaging studies  CBC: Recent Labs  Lab 06/09/17 0646 06/12/17 0412  WBC 4.2 6.6  HGB 10.3* 11.3*  HCT 33.2* 36.1*  MCV 76.7* 75.8*  PLT 267 301   Basic Metabolic Panel: Recent Labs  Lab 06/09/17 0646 06/12/17 0412  NA 140 137  K 4.9 4.2  CL 107 94*  CO2 30 35*  GLUCOSE 132* 134*  BUN 12 17  CREATININE 0.65 0.67  CALCIUM 8.4* 8.8*   GFR: Estimated Creatinine Clearance: 82.1  mL/min (by C-G formula based on SCr of 0.67 mg/dL). Liver Function Tests: No results for input(s): AST, ALT, ALKPHOS, BILITOT, PROT, ALBUMIN in the last 168 hours. No results for input(s): LIPASE, AMYLASE in the last 168 hours. No results for input(s): AMMONIA in the last 168 hours. Coagulation Profile: No results for input(s): INR, PROTIME in the last 168 hours. Cardiac Enzymes: No results for input(s): CKTOTAL, CKMB, CKMBINDEX, TROPONINI in the last 168 hours. BNP (last 3 results) No results for input(s): PROBNP in the last 8760 hours. HbA1C: No results for input(s): HGBA1C in the last 72 hours. CBG: No results for input(s): GLUCAP in the last 168 hours. Lipid Profile: No results for input(s):  CHOL, HDL, LDLCALC, TRIG, CHOLHDL, LDLDIRECT in the last 72 hours. Thyroid Function Tests: No results for input(s): TSH, T4TOTAL, FREET4, T3FREE, THYROIDAB in the last 72 hours. Anemia Panel: No results for input(s): VITAMINB12, FOLATE, FERRITIN, TIBC, IRON, RETICCTPCT in the last 72 hours. Urine analysis:    Component Value Date/Time   COLORURINE STRAW (A) 09/20/2016 1216   APPEARANCEUR CLEAR 09/20/2016 1216   LABSPEC 1.015 05/10/2017 1002   PHURINE 7.0 05/10/2017 1002   GLUCOSEU NEGATIVE 05/10/2017 1002   HGBUR NEGATIVE 05/10/2017 1002   BILIRUBINUR NEGATIVE 05/10/2017 1002   KETONESUR NEGATIVE 05/10/2017 1002   PROTEINUR NEGATIVE 05/10/2017 1002   UROBILINOGEN 0.2 05/10/2017 1002   NITRITE NEGATIVE 05/10/2017 1002   LEUKOCYTESUR NEGATIVE 05/10/2017 1002   Sepsis Labs: @LABRCNTIP (procalcitonin:4,lacticidven:4) )No results found for this or any previous visit (from the past 240 hour(s)).       Radiology Studies: No results found.    Scheduled Meds: . amLODipine  5 mg Oral Daily  . azelastine  2 spray Each Nare BID  . benzonatate  100 mg Oral TID  . budesonide (PULMICORT) nebulizer solution  0.5 mg Nebulization BID  . enoxaparin (LOVENOX) injection  40 mg Subcutaneous Q24H  . feeding supplement (ENSURE ENLIVE)  237 mL Oral TID BM  . fluticasone  2 spray Each Nare BID  . guaiFENesin  1,200 mg Oral BID  . ipratropium-albuterol  3 mL Nebulization Q6H  . methylPREDNISolone (SOLU-MEDROL) injection  60 mg Intravenous Q6H  . morphine  7.5 mg Oral TID  . pantoprazole (PROTONIX) IV  40 mg Intravenous Q12H   Continuous Infusions:   LOS: 6 days    Time spent in minutes: 35    Calvert CantorSaima Ras Kollman, MD [facility] Pager: [email or web address] Password [email or web address] 06/15/2017, 3:17 PM

## 2017-06-16 LAB — BASIC METABOLIC PANEL
Anion gap: 10 (ref 5–15)
BUN: 23 mg/dL — AB (ref 6–20)
CALCIUM: 9 mg/dL (ref 8.9–10.3)
CO2: 34 mmol/L — ABNORMAL HIGH (ref 22–32)
Chloride: 95 mmol/L — ABNORMAL LOW (ref 101–111)
Creatinine, Ser: 0.65 mg/dL (ref 0.61–1.24)
GFR calc Af Amer: >60 mL/min (ref >60)
GLUCOSE: 150 mg/dL — AB (ref 65–99)
Potassium: 4.5 mmol/L (ref 3.5–5.1)
Sodium: 139 mmol/L (ref 135–145)

## 2017-06-16 LAB — CBC
HEMATOCRIT: 39.4 % (ref 39.0–52.0)
Hemoglobin: 12.6 g/dL — ABNORMAL LOW (ref 13.0–17.0)
MCH: 24.1 pg — ABNORMAL LOW (ref 26.0–34.0)
MCHC: 32 g/dL (ref 30.0–36.0)
MCV: 75.3 fL — ABNORMAL LOW (ref 78.0–100.0)
Platelets: 333 10*3/uL (ref 150–400)
RBC: 5.23 MIL/uL (ref 4.22–5.81)
RDW: 13.7 % (ref 11.5–15.5)
WBC: 7 10*3/uL (ref 4.0–10.5)

## 2017-06-16 NOTE — Consult Note (Signed)
Consultation Note Date: 06/16/2017   Patient Name: Matthew Morrison  DOB: 07/07/1955  MRN: [id number]  Age / Sex: 62 y.o., male  PCP: Matthew Jun, FNP Referring Physician: Debbe Odea, MD  Reason for Consultation: Establishing goals of care  HPI/Patient Profile: 62 y.o. male  with past medical history of end stage COPD admitted on 06/08/2017 with COPD exacerbation.  Palliative consulted for goals of care.   Clinical Assessment and Goals of Care: I met today with Matthew Morrison. We discussed clinical course as well as wishes moving forward in regard to advanced directives.  Concepts specific to code status and rehospitalization discussed.  We discussed difference between a aggressive medical intervention path and a palliative, comfort focused care path.  Values and goals of care important to patient and family were attempted to be elicited.  Concept of Hospice and Palliative Care were discussed  Questions and concerns addressed.   PMT will continue to support holistically.   SUMMARY OF RECOMMENDATIONS   -His main issue is his homelessness, which would also preclude him from home hospice services.  Will request SW to evaluate and assist as possible. - Discussed MOST form and recommendation to consider CODE STATUS. - He would like to name his brother as his 36.  Will consult spiritual care.   Code Status/Advance Care Planning:  Full code   Symptom Management:   Agree with morphine.   Prognosis:   Unable to determine  Discharge Planning: To Be Determined      Primary Diagnoses: Present on Admission: . Hypercholesterolemia . COPD with acute exacerbation ([place]) . COPD GOLD IV . Acute exacerbation of chronic obstructive pulmonary disease (COPD) ([place])   I have reviewed the medical record, interviewed the patient and family, and examined the patient. The following aspects are pertinent.  Past  Medical History:  Diagnosis Date  . Asthma   . Emphysema lung ([place])   . Hypertension    Social History   Socioeconomic History  . Marital status: Single    Spouse name: None  . Number of children: None  . Years of education: None  . Highest education level: None  Social Needs  . Financial resource strain: None  . Food insecurity - worry: None  . Food insecurity - inability: None  . Transportation needs - medical: None  . Transportation needs - non-medical: None  Occupational History  . None  Tobacco Use  . Smoking status: Former Smoker    Packs/day: 1.00    Years: 40.00    Pack years: 40.00    Types: Cigarettes    Last attempt to quit: 09/06/2016    Years since quitting: 0.7  . Smokeless tobacco: Never Used  Substance and Sexual Activity  . Alcohol use: No  . Drug use: No  . Sexual activity: No  Other Topics Concern  . None  Social History Narrative  . None   Family History  Problem Relation Age of Onset  . Depression Mother   . Stroke Father    Scheduled Meds: .  amLODipine  5 mg Oral Daily  . azelastine  2 spray Each Nare BID  . benzonatate  100 mg Oral TID  . budesonide (PULMICORT) nebulizer solution  0.5 mg Nebulization BID  . enoxaparin (LOVENOX) injection  40 mg Subcutaneous Q24H  . feeding supplement (ENSURE ENLIVE)  237 mL Oral TID BM  . fluticasone  2 spray Each Nare BID  . guaiFENesin  1,200 mg Oral BID  . ipratropium-albuterol  3 mL Nebulization Q6H  . methylPREDNISolone (SOLU-MEDROL) injection  60 mg Intravenous Q6H  . morphine  7.5 mg Oral TID  . pantoprazole (PROTONIX) IV  40 mg Intravenous Q12H   Continuous Infusions: PRN Meds:.acetaminophen **OR** acetaminophen, albuterol, chlorpheniramine-HYDROcodone, hydrOXYzine, ondansetron **OR** ondansetron (ZOFRAN) IV, polyethylene glycol, traMADol Medications Prior to Admission:  Prior to Admission medications   Medication Sig Start Date End Date Taking? Authorizing Provider  acetaminophen (TYLENOL)  500 MG tablet Take 1,000 mg by mouth daily as needed for headache.   Yes [provider]  famotidine (PEPCID) 20 MG tablet Take 1 tablet (20 mg total) by mouth at bedtime. 06/05/17  Yes Matthew Jun, FNP  hydrOXYzine (ATARAX/VISTARIL) 10 MG tablet TAKE 1 TABLET BY MOUTH 3 TIMES DAILY AS NEEDED FOR ANXIETY. 05/22/17  Yes Matthew Jun, FNP  ipratropium-albuterol (DUONEB) 0.5-2.5 (3) MG/3ML SOLN Take 3 mLs by nebulization every 6 (six) hours as needed. 06/07/17  Yes Matthew Jun, FNP  omeprazole (PRILOSEC) 40 MG capsule Take 1 capsule (40 mg total) by mouth daily. 05/22/17  Yes Matthew Jun, FNP  OXYGEN 2 lpm with sleep and occ during the day   Yes [provider]  predniSONE (DELTASONE) 20 MG tablet Take 1 tablet (20 mg total) by mouth daily with breakfast. 05/22/17  Yes Matthew Jun, FNP  PROVENTIL HFA 108 [phone] Base) MCG/ACT inhaler Inhale 2 puffs into the lungs every 4 (four) hours as needed for wheezing or shortness of breath. 05/22/17  Yes Matthew Jun, FNP  Tiotropium Bromide-Olodaterol (STIOLTO RESPIMAT) 2.5-2.5 MCG/ACT AERS Inhale 2 puffs into the lungs daily. 06/05/17  Yes Matthew Jun, FNP  acetaminophen-codeine (TYLENOL #3) 300-30 MG tablet Take 1 tablet by mouth every 4 (four) hours as needed for severe pain. Patient not taking: Reported on 06/08/2017 05/10/17   Matthew Jun, FNP  budesonide (PULMICORT) 0.25 MG/2ML nebulizer solution Take 2 mLs (0.25 mg total) by nebulization 2 (two) times daily. Patient not taking: Reported on 06/08/2017 06/06/17 01/02/18  Matthew Jun, FNP  mupirocin cream (BACTROBAN) 2 % Apply topically 2 (two) times daily. Patient not taking: Reported on 06/08/2017 11/03/16   Bethena Roys, MD  polyethylene glycol (MIRALAX) packet Take 17 g by mouth 2 (two) times daily. Patient not taking: Reported on 06/08/2017 05/22/17   Matthew Jun, FNP   Allergies  Allergen Reactions  . Norflex [Orphenadrine] Hives    Review of Systems + fatigue and SOB  Physical Exam General: Alert, awake, in no acute distress.  HEENT: No bruits, no goiter, no JVD Heart: Regular rate and rhythm. No murmur appreciated. Lungs: Diminished air movement Abdomen: Soft, nontender, nondistended, positive bowel sounds.  Ext: No significant edema Skin: Warm and dry Neuro: Grossly intact, nonfocal.  Vital Signs: BP (!) 159/94 (BP Location: Left Arm)   Pulse (!) 110   Temp 98 F (36.7 C) (Oral)   Resp 18   Ht 5' 4"  (1.626 m)   Wt 58.4 kg (128 lb 12 oz)   SpO2 92%  BMI 22.10 kg/m  Pain Assessment: No/denies pain POSS *See Group Information*: 1-Acceptable,Awake and alert Pain Score: 0-No pain   SpO2: SpO2: 92 % O2 Device:SpO2: 92 % O2 Flow Rate: .O2 Flow Rate (L/min): 2 L/min  IO: Intake/output summary:   Intake/Output Summary (Last 24 hours) at 06/16/2017 2130 Last data filed at 06/16/2017 1907 Gross per 24 hour  Intake 950 ml  Output -  Net 950 ml    LBM: Last BM Date: 06/14/17 Baseline Weight: Weight: 58.4 kg (128 lb 12 oz) Most recent weight: Weight: 58.4 kg (128 lb 12 oz)     Palliative Assessment/Data:    Time Total: 60 Greater than 50%  of this time was spent counseling and coordinating care related to the above assessment and plan.  Signed by: Micheline Rough, MD   Please contact Palliative Medicine Team phone at [phone] for questions and concerns.  For individual provider: See Shea Evans

## 2017-06-16 NOTE — Progress Notes (Signed)
Name: Matthew SpeedDana Morrison MRN: [id number] DOB: 1956/02/17    ADMISSION DATE:  06/08/2017 CONSULTATION DATE:  3/11  REFERRING MD :  Rito EhrlichKrishnan  CHIEF COMPLAINT:  Slow to resolve AECOPD   BRIEF PATIENT DESCRIPTION:  This is a 62 year old patient followed by Dr. Sherene SiresWert with a significant history of gold IV COPD (last FEV1 25%), stopped smoking over a year ago.  Was admitted on 3/7 with approximately 3-4-day history of runny nose, nasal and sinus congestion, sore throat, cough, initially productive of green sputum then nonproductive, and associated with wheezing and worsening shortness of breath.  He was initially using his rescue albuterol nebulizer and MDI however he ran out of his  rescue therapies and therefore presented to the emergency room for further therapy.  In the emergency room his chest x-ray was clear he was admitted with a working diagnosis of acute exacerbation chronic obstructive pulmonary disease.  Is been treated in usual fashion this is included: Systemic IV steroids Supplemental oxygen,, scheduled bronchodilators, empiric antibiotics, and as needed cough suppression.  He feels as though he has made little to no improvement since admission, his primary complaint at this point is ongoing cough which is nonproductive in nature but results in significant shortness of breath from which she finds quite difficult to recover from.  He continues to have postnasal drip, and has started to have some complaint of heartburn/reflux.  Pulmonary has been asked to see based on his lack of improvement to standard therapies. SIGNIFICANT EVENTS    STUDIES:    SUBJECTIVE:  Feels minimally better  VITAL SIGNS: Temp:  [97.7 F (36.5 C)-98.2 F (36.8 C)] 97.7 F (36.5 C) (03/15 0535) Pulse Rate:  [96-118] 96 (03/15 0535) Resp:  [18-20] 20 (03/15 0535) BP: (102-157)/(60-114) 156/110 (03/15 0535) SpO2:  [91 %-96 %] 91 % (03/15 0735)  PHYSICAL EXAMINATION:  General- frail debilitated 62 year old male.  Resting in bed but still w/ sig exertional weakness HENT upper airway wheeze has improved.  Pulm still w/ tight decreased BS t/o Card: RRR  Ext No edema  abd not tender + bowel sounds Neuro awake and alert   Recent Labs  Lab 06/12/17 0412 06/16/17 0347  NA 137 139  K 4.2 4.5  CL 94* 95*  CO2 35* 34*  BUN 17 23*  CREATININE 0.67 0.65  GLUCOSE 134* 150*   Recent Labs  Lab 06/12/17 0412 06/16/17 0347  HGB 11.3* 12.6*  HCT 36.1* 39.4  WBC 6.6 7.0  PLT 301 333   No results found.  ASSESSMENT / PLAN:  Acute exacerbation chronic obstructive pulmonary disease Gold IV COPD  Acute bronchitis in setting of viral URI postnasal drip Post viral cough Gastroesophageal reflux disease   Discussion 62 year old male patient with recent URI infection and resultant acute exacerbation of underlying chronic obstructive pulmonary disease. He is improving very slowly and really remains marginal at best. I think he would qualify for hospice but his home situation would be the challenge here. ? Could he go to SNF??  Plan/rec Cont BDs Cont systemic steroids Scheduled tussionex  gerd RX Cont nasal steroids and antihistamines Flutter V Consider full DNR. I have discussed w/ him that I think he would be a poor candidate for mechanical ventilation and would be at high chance he could not come off. I have advised him that to consider his advanced directives.    Simonne MartinetPeter E Abem Shaddix ACNP-BC [facility] Pager # [phone] OR # [phone] if no answer  06/16/2017, 11:19 AM

## 2017-06-16 NOTE — Progress Notes (Addendum)
Nutrition Follow-up  DOCUMENTATION CODES:   Not applicable  INTERVENTION:   Continue Ensure Enlive po TID, each supplement provides 350 kcal and 20 grams of protein   NUTRITION DIAGNOSIS:   Inadequate oral intake related to other (see comment)(breathing difficulty) as evidenced by per patient/family report.  Ongoing  GOAL:   Patient will meet greater than or equal to 90% of their needs  Met  MONITOR:   PO intake, Supplement acceptance, Weight trends, Labs  REASON FOR ASSESSMENT:   Consult COPD Protocol  ASSESSMENT:   Patient with PMH significant for COPD, prior tobacco abuse, and HTN. Presents this admission with SOB 3 days PTA.     Spoke with pt who reports:  --He is feeling better. Still has breathing problems, but he has a good appetite.  --Confirmed Meals Eaten Percentages are 100% for past 6 days --Endorses that he is drinking Ensure and likes them --Has gained weight per bedside measurement: 60.7kg --Reviewed additional ways to increase calories and protein after discharge.   Medications Prednisone Protonix Morphine  Labs reviewed  Diet Order:  Diet regular Room service appropriate? Yes; Fluid consistency: Thin  EDUCATION NEEDS:   Education needs have been addressed  Skin:  Skin Assessment: Reviewed RN Assessment  Last BM:  06/08/17  Height:   Ht Readings from Last 1 Encounters:  06/08/17 5' 4"  (1.626 m)    Weight:   Wt Readings from Last 1 Encounters:  06/08/17 128 lb 12 oz (58.4 kg)    Ideal Body Weight:  59.1 kg  BMI:  Body mass index is 22.1 kg/m.  Estimated Nutritional Needs:   Kcal:  1550-1750 kcal/day  Protein:  80-90 g/day  Fluid:  >1.5 L/day    Edmonia Lynch Dietetic Intern Pager: [phone]

## 2017-06-16 NOTE — Progress Notes (Signed)
PROGRESS NOTE    Matthew Morrison   OZH:[id number]  DOB: 01-Nov-1955  DOA: 06/08/2017 PCP: Bing Neighbors, FNP   Brief Narrative:  Matthew Morrison  62 year old African-American male with a past medical history of gold stage IV COPD, prior tobacco abuse, hypertension who is on chronic steroids at home presented with 3-day history of shortness of breath and is admitted for a COPD exacerbation.    Subjective: Cough and dyspnea are the same as yesterday. Still very short of breath with slight movement. ROS: no complaints of nausea, vomiting, constipation diarrhea, cough, dyspnea or dysuria. No other complaints.   Assessment & Plan:   Active Problems:   Acute exacerbation of chronic obstructive pulmonary disease  - on 2 L O2 at baseline   Acute respiratory failure with hypoxia and hypercapnia     COPD GOLD IV - started smoking at age 74- no longer smokes - cont current treatment with Nebs, IV Steroids and Morphine for air hunger, Mucinex - at baseline he cannot walk more than a minute or two before stopping - uses Prednisone 20 mg daily at baseline  - cont on 2L O2  - he remains a full code but he will discuss code status with his brother and give [place] his decision - PCCM giving MSIR to 7.5 mg TID assist with dyspnea -  palliative care consult for goals of care is appreciated - social situation is an issue for discharge- he lives in a shelter and is unable to go back to his brother's house as he is not on the lease - spoke with social work today to see if anything can be done to help him - still on high dose IV steroids with ongoing wheezing  HTN - Amlodipine    DVT prophylaxis: Lovenox Code Status: Full code Family Communication: none- patient alert and oriented Disposition Plan: to be determined Consultants:   PCCM  Palliative  Procedures:   none Antimicrobials:  Anti-infectives (From admission, onward)   Start     Dose/Rate Route Frequency Ordered Stop   06/09/17 1000   levofloxacin (LEVAQUIN) tablet 500 mg  Status:  Discontinued     500 mg Oral Daily 06/09/17 0852 06/13/17 1134       Objective: Vitals:   06/15/17 2140 06/16/17 0228 06/16/17 0535 06/16/17 0735  BP: 102/60  (!) 156/110   Pulse: (!) 111  96   Resp: 20  20   Temp: 98.1 F (36.7 C)  97.7 F (36.5 C)   TempSrc: Oral  Oral   SpO2: 96% 96% 96% 91%  Weight:      Height:       No intake or output data in the 24 hours ending 06/16/17 1417 Filed Weights   06/08/17 1732  Weight: 58.4 kg (128 lb 12 oz)    Examination: General exam: Appears comfortable  HEENT: PERRLA, oral mucosa moist, no sclera icterus or thrush Respiratory system: mild wheezing bilaterally-  resp rate normal-   Cardiovascular system: S1 & S2 heard, RRR.  tachycardia  Gastrointestinal system: Abdomen soft, non-tender, nondistended. Normal bowel sound. No organomegaly Central nervous system: Alert and oriented. No focal neurological deficits. Extremities: No cyanosis, clubbing or edema Skin: No rashes or ulcers Psychiatry:  Mood & affect appropriate.     Data Reviewed: I have personally reviewed following labs and imaging studies  CBC: Recent Labs  Lab 06/12/17 0412 06/16/17 0347  WBC 6.6 7.0  HGB 11.3* 12.6*  HCT 36.1* 39.4  MCV 75.8* 75.3*  PLT  301 333   Basic Metabolic Panel: Recent Labs  Lab 06/12/17 0412 06/16/17 0347  NA 137 139  K 4.2 4.5  CL 94* 95*  CO2 35* 34*  GLUCOSE 134* 150*  BUN 17 23*  CREATININE 0.67 0.65  CALCIUM 8.8* 9.0   GFR: Estimated Creatinine Clearance: 82.1 mL/min (by C-G formula based on SCr of 0.65 mg/dL). Liver Function Tests: No results for input(s): AST, ALT, ALKPHOS, BILITOT, PROT, ALBUMIN in the last 168 hours. No results for input(s): LIPASE, AMYLASE in the last 168 hours. No results for input(s): AMMONIA in the last 168 hours. Coagulation Profile: No results for input(s): INR, PROTIME in the last 168 hours. Cardiac Enzymes: No results for input(s):  CKTOTAL, CKMB, CKMBINDEX, TROPONINI in the last 168 hours. BNP (last 3 results) No results for input(s): PROBNP in the last 8760 hours. HbA1C: No results for input(s): HGBA1C in the last 72 hours. CBG: No results for input(s): GLUCAP in the last 168 hours. Lipid Profile: No results for input(s): CHOL, HDL, LDLCALC, TRIG, CHOLHDL, LDLDIRECT in the last 72 hours. Thyroid Function Tests: No results for input(s): TSH, T4TOTAL, FREET4, T3FREE, THYROIDAB in the last 72 hours. Anemia Panel: No results for input(s): VITAMINB12, FOLATE, FERRITIN, TIBC, IRON, RETICCTPCT in the last 72 hours. Urine analysis:    Component Value Date/Time   COLORURINE STRAW (A) 09/20/2016 1216   APPEARANCEUR CLEAR 09/20/2016 1216   LABSPEC 1.015 05/10/2017 1002   PHURINE 7.0 05/10/2017 1002   GLUCOSEU NEGATIVE 05/10/2017 1002   HGBUR NEGATIVE 05/10/2017 1002   BILIRUBINUR NEGATIVE 05/10/2017 1002   KETONESUR NEGATIVE 05/10/2017 1002   PROTEINUR NEGATIVE 05/10/2017 1002   UROBILINOGEN 0.2 05/10/2017 1002   NITRITE NEGATIVE 05/10/2017 1002   LEUKOCYTESUR NEGATIVE 05/10/2017 1002   Sepsis Labs: @LABRCNTIP (procalcitonin:4,lacticidven:4) )No results found for this or any previous visit (from the past 240 hour(s)).       Radiology Studies: No results found.    Scheduled Meds: . amLODipine  5 mg Oral Daily  . azelastine  2 spray Each Nare BID  . benzonatate  100 mg Oral TID  . budesonide (PULMICORT) nebulizer solution  0.5 mg Nebulization BID  . enoxaparin (LOVENOX) injection  40 mg Subcutaneous Q24H  . feeding supplement (ENSURE ENLIVE)  237 mL Oral TID BM  . fluticasone  2 spray Each Nare BID  . guaiFENesin  1,200 mg Oral BID  . ipratropium-albuterol  3 mL Nebulization Q6H  . methylPREDNISolone (SOLU-MEDROL) injection  60 mg Intravenous Q6H  . morphine  7.5 mg Oral TID  . pantoprazole (PROTONIX) IV  40 mg Intravenous Q12H   Continuous Infusions:   LOS: 7 days    Time spent in minutes:  35    Calvert CantorSaima Parthena Fergeson, MD [facility] Pager: [email or web address] Password [email or web address] 06/16/2017, 2:17 PM

## 2017-06-17 NOTE — Progress Notes (Signed)
Patient ID: Matthew Morrison, male   DOB: 06-09-1955, 62 y.o.   MRN: [id number]  PROGRESS NOTE    Matthew Morrison  NWG:[id number] DOB: 06-09-1955 DOA: 06/08/2017  PCP: Bing NeighborsHarris, Kimberly S, FNP   Brief Narrative:  62 year old male with a past medical history of gold stage IV COPD, prior tobacco abuse, hypertension who is on chronic steroids at home presented with 3-day history of shortness of breath and is admitted for a COPD exacerbation.    Assessment & Plan:   Active Problems:   Acute exacerbation of chronic obstructive pulmonary disease  - on 2 L O2 at baseline   Acute respiratory failure with hypoxia and hypercapnia     COPD GOLD IV - Pt still wheezing - Continue solumedrol current regimen - Continue bronchodilator treatment - Continue oxygen support via [place] to keep O2 sats above 90%  Essential hypertension  - Continue amlodipine     DVT prophylaxis: Lovenox subQ Code Status: full code  Family Communication: no family at bedside Disposition Plan: home once resp status improves    Consultants:   None   Procedures:  None   Antimicrobials:   Levaquin    Subjective: No overnight events.  Objective: Vitals:   06/17/17 0703 06/17/17 0938 06/17/17 1228 06/17/17 1229  BP: (!) 143/97  (!) 161/113 (!) 146/106  Pulse: 93  (!) 117   Resp: 16  18   Temp: 97.9 F (36.6 C)  97.7 F (36.5 C)   TempSrc: Oral  Oral   SpO2: 97% 91% 93%   Weight:      Height:        Intake/Output Summary (Last 24 hours) at 06/17/2017 1457 Last data filed at 06/17/2017 1100 Gross per 24 hour  Intake 540 ml  Output -  Net 540 ml   Filed Weights   06/08/17 1732  Weight: 58.4 kg (128 lb 12 oz)    Examination:  General exam: Appears calm and comfortable  Respiratory system: wheezing in upper lung lobes, no rhonchi  Cardiovascular system: S1 & S2 heard, RRR.  Gastrointestinal system: Abdomen is nondistended, soft and nontender. No organomegaly or masses felt. Normal bowel sounds  heard. Central nervous system: Alert and oriented. No focal neurological deficits. Extremities: Symmetric 5 x 5 power. Skin: No rashes, lesions or ulcers Psychiatry: Judgement and insight appear normal. Mood & affect appropriate.   Data Reviewed: I have personally reviewed following labs and imaging studies  CBC: Recent Labs  Lab 06/12/17 0412 06/16/17 0347  WBC 6.6 7.0  HGB 11.3* 12.6*  HCT 36.1* 39.4  MCV 75.8* 75.3*  PLT 301 333   Basic Metabolic Panel: Recent Labs  Lab 06/12/17 0412 06/16/17 0347  NA 137 139  K 4.2 4.5  CL 94* 95*  CO2 35* 34*  GLUCOSE 134* 150*  BUN 17 23*  CREATININE 0.67 0.65  CALCIUM 8.8* 9.0   GFR: Estimated Creatinine Clearance: 82.1 mL/min (by C-G formula based on SCr of 0.65 mg/dL). Liver Function Tests: No results for input(s): AST, ALT, ALKPHOS, BILITOT, PROT, ALBUMIN in the last 168 hours. No results for input(s): LIPASE, AMYLASE in the last 168 hours. No results for input(s): AMMONIA in the last 168 hours. Coagulation Profile: No results for input(s): INR, PROTIME in the last 168 hours. Cardiac Enzymes: No results for input(s): CKTOTAL, CKMB, CKMBINDEX, TROPONINI in the last 168 hours. BNP (last 3 results) No results for input(s): PROBNP in the last 8760 hours. HbA1C: No results for input(s): HGBA1C in the last 72 hours.  CBG: No results for input(s): GLUCAP in the last 168 hours. Lipid Profile: No results for input(s): CHOL, HDL, LDLCALC, TRIG, CHOLHDL, LDLDIRECT in the last 72 hours. Thyroid Function Tests: No results for input(s): TSH, T4TOTAL, FREET4, T3FREE, THYROIDAB in the last 72 hours. Anemia Panel: No results for input(s): VITAMINB12, FOLATE, FERRITIN, TIBC, IRON, RETICCTPCT in the last 72 hours. Urine analysis:    Component Value Date/Time   COLORURINE STRAW (A) 09/20/2016 1216   APPEARANCEUR CLEAR 09/20/2016 1216   LABSPEC 1.015 05/10/2017 1002   PHURINE 7.0 05/10/2017 1002   GLUCOSEU NEGATIVE 05/10/2017 1002    HGBUR NEGATIVE 05/10/2017 1002   BILIRUBINUR NEGATIVE 05/10/2017 1002   KETONESUR NEGATIVE 05/10/2017 1002   PROTEINUR NEGATIVE 05/10/2017 1002   UROBILINOGEN 0.2 05/10/2017 1002   NITRITE NEGATIVE 05/10/2017 1002   LEUKOCYTESUR NEGATIVE 05/10/2017 1002   Sepsis Labs: @LABRCNTIP (procalcitonin:4,lacticidven:4)   )No results found for this or any previous visit (from the past 240 hour(s)).    Radiology Studies: No results found.   Scheduled Meds: . amLODipine  5 mg Oral Daily  . azelastine  2 spray Each Nare BID  . benzonatate  100 mg Oral TID  . budesonide (PULMICORT) nebulizer solution  0.5 mg Nebulization BID  . enoxaparin (LOVENOX) injection  40 mg Subcutaneous Q24H  . feeding supplement (ENSURE ENLIVE)  237 mL Oral TID BM  . fluticasone  2 spray Each Nare BID  . guaiFENesin  1,200 mg Oral BID  . ipratropium-albuterol  3 mL Nebulization Q6H  . methylPREDNISolone (SOLU-MEDROL) injection  60 mg Intravenous Q6H  . morphine  7.5 mg Oral TID  . pantoprazole (PROTONIX) IV  40 mg Intravenous Q12H   Continuous Infusions:   LOS: 8 days    Time spent: 25 minutes  Greater than 50% of the time spent on counseling and coordinating the care.   Manson Passey, MD [facility] Pager [phone]  If 7PM-7AM, please contact night-coverage [email or web address] Password [facility] 06/17/2017, 2:57 PM

## 2017-06-17 NOTE — Clinical Social Work Note (Signed)
Clinical Social Work Assessment  Patient Details  Name: Matthew Morrison MRN: [id number] Date of Birth: 1955-09-29  Date of referral:  06/16/17               Reason for consult:  Housing Concerns/Homelessness, End of Life/Hospice(decision making)                Permission sought to share information with:  Family Supports Permission granted to share information::  Yes, Verbal Permission Granted  Name::     brother Nutritional therapist::     Relationship::     Contact Information:     Housing/Transportation Living arrangements for the past 2 months:  Apartment(homeless shelter for past 5 days PTA) Source of Information:  Patient, Medical Team Patient Interpreter Needed:  None Criminal Activity/Legal Involvement Pertinent to Current Situation/Hospitalization:  No - Comment as needed Significant Relationships:  Adult Children, Other Family Members Lives with:  Self Do you feel safe going back to the place where you live?  Yes Need for family participation in patient care:  No (Coment)(however pt reports he wants to pursue naming brother POA)  Care giving concerns:  Pt is from [place] but moved to [place] some time ago to live with his brother (lived there for the past 8 months he reports). Pt has "lots of family, siblings, kids, grandkids and great-grandkids, nephews, nieces, " both in [place] and [place]. Pt is on home O2 which was being supplied by [facility]. Pt reports "5 days before I got in the hospital, I had to leave my brother's home bc I'm not on the lease there." States he has been at [facility] (managed by [employer]) since then. States he "stays in a coffee shop from 6am-6pm and at the shelter at night. Reports he is working with a case Insurance underwriter at [employer] to [employer] housing assistance. Pt has COPD and reports when he moved out of his brother's home he "returned his oxygen concentrator to [facility] since I can't have it in the shelter. " reports he has a  nebulizer and small O2 tank that he carries around with him, but gets short of breath very easily.  States, "I have stayed out of the hospital for several months, this just set me back." Pt reports he has PCP at [facility]. States he has not been taking medications consistently.  Gets disability - approximately $550/month, and has community [employer].  Pt reports difficulty ambulating lately as he has been short of breath easily.    Social Worker assessment / plan:  CSW met with pt to assess needs basedon report of recent homelessness issues and chronic illness (COPD). Met with pt at bedside. He was alert/oriented, engaged. Very pleasant and good historian.  Pt reported brief hx of social and medical issues above. States he feels he has strong support system in his family but that no housing is immediately available to him and due to his fixed income, resources are limited. Working with [employer] on housing options.  Pt's main medical issue is COPD (see provider notes) and per input from PMT and hospitalist, may actually be appropriate for hospice.   At time of CSW assessment, pt reports his plan is to return to [facility] at [place]. Aware he cannot access hospice services due to staying in shelter.  States transportation is not usually issue as he "saves money to use for taxis."  CSW discussed case with attending and PMT- further  discussions re: pt's goals of care and treatment plan. PT consult pending to assess ambulation assistance needs. Pt states should facility placement be recommended, he would be interested. However, CSW did not yet discuss all options and potential financial requirements (e.i., medicaid requirements and palliative/rehab placement v hospice, etc.)  Will continue following to assist with disposition needs as case progresses.   Employment status:  Disabled (Comment on whether or not currently receiving Disability)(recieves  disability) Insurance information:  [place]icaid [place] PT Recommendations:  (pending ) Information / Referral to community resources:     Patient/Family's Response to care:  Pt engaged and expresses appreciative for care.   Patient/Family's Understanding of and Emotional Response to Diagnosis, Current Treatment, and Prognosis:  Pt demonstrates adequate understanding of his treatment. Unable to name exactly why he has not consistently taken meds as he states he usually does not have trouble accessing medications. However, he is insightful into how his COPD and anxiety symptoms can be symbiotic, describing how anxiety exacerbates SOB and vice versa. States his Vistaril tends to help with his anxiety symptoms. Is sad re: his recent housing issues and chronic illness, however he is very assured and well-adjusted, cooperative, and reasonable when describing his barriers and expectations (both with social and medical issues). States, "I am grateful for the things I have. It is what it is, I hope I can get more stable."  Emotional Assessment Appearance:  Appears younger than stated age Attitude/Demeanor/Rapport:  Engaged Affect (typically observed):  Accepting, Adaptable, Calm Orientation:  Oriented to Self, Oriented to Place, Oriented to  Time, Oriented to Situation Alcohol / Substance use:  Not Applicable Psych involvement (Current and /or in the community):  Outpatient Provider  Discharge Needs  Concerns to be addressed:  Homelessness, Decision making concerns, Coping/Stress Concerns, Mental Health Concerns Readmission within the last 30 days:  No Current discharge risk:  Chronically ill Barriers to Discharge:  Continued Medical Work up   [employer], LCSW 06/17/2017, 2:06 PM [phone]

## 2017-06-17 NOTE — Evaluation (Signed)
Physical Therapy Evaluation Patient Details Name: Kathrynn SpeedDana Wain MRN: [id number] DOB: 01/05/1956 Today's Date: 06/17/2017   History of Present Illness  62 year old male with a past medical history of gold stage IV COPD, prior tobacco abuse, hypertension who is on chronic steroids at home presented with 3-day history of shortness of breath and is admitted for a COPD exacerbation.   Clinical Impression  Patient presents with decreased mobility due to decreased activity tolerance and limited cardiopulmonary reserve compared to prior to admission.  Feel he can benefit from acute skilled PT to progress mobility and tolerance to standing/walking activities for ADL's and IADL's and follow up SNF level rehab to allow pt to tolerate mobility necessary for community mobility.     Follow Up Recommendations SNF    Equipment Recommendations  None recommended by PT    Recommendations for Other Services       Precautions / Restrictions Precautions Precautions: Other (comment) Precaution Comments: oxygen dependent      Mobility  Bed Mobility Overal bed mobility: Modified Independent                Transfers Overall transfer level: Modified independent                  Ambulation/Gait Ambulation/Gait assistance: Supervision Ambulation Distance (Feet): 40 Feet Assistive device: None Gait Pattern/deviations: Step-through pattern;Drifts right/left     General Gait Details: limited due to SOB, SpO2 92% on 3L O2 with HR 123; took about 3 minutes to recover  Stairs            Wheelchair Mobility    Modified Rankin (Stroke Patients Only)       Balance Overall balance assessment: No apparent balance deficits (not formally assessed)                                           Pertinent Vitals/Pain Pain Assessment: No/denies pain    Home Living Family/patient expects to be discharged to:: Unsure Living Arrangements: Alone             Home  Equipment: Walker - 4 wheels;Shower seat Additional Comments: was living with his brother, but reports cannot stay with him due to not being on the lease, has [employer] concentrator and nebulizer    Prior Function Level of Independence: Independent         Comments: ADL and IADL require extended period of time and rest breaks; can walk as far as Programmer, systemsmailbox     Hand Dominance        Extremity/Trunk Assessment   Upper Extremity Assessment Upper Extremity Assessment: Overall WFL for tasks assessed    Lower Extremity Assessment Lower Extremity Assessment: Generalized weakness(SpO2 drops to 87% on 3L O2 with LE strength testing)       Communication   Communication: No difficulties  Cognition Arousal/Alertness: Awake/alert Behavior During Therapy: WFL for tasks assessed/performed Overall Cognitive Status: Within Functional Limits for tasks assessed                                        General Comments      Exercises     Assessment/Plan    PT Assessment Patient needs continued PT services  PT Problem List Decreased mobility;Cardiopulmonary status limiting activity;Decreased activity tolerance  PT Treatment Interventions Therapeutic activities;Gait training;Therapeutic exercise;Patient/family education;Functional mobility training    PT Goals (Current goals can be found in the Care Plan section)  Acute Rehab PT Goals Patient Stated Goal: To return to walking to mailbox PT Goal Formulation: With patient Time For Goal Achievement: 06/24/17 Potential to Achieve Goals: Good    Frequency Min 3X/week   Barriers to discharge Other (comment)(homeless)      Co-evaluation               AM-PAC PT "6 Clicks" Daily Activity  Outcome Measure Difficulty turning over in bed (including adjusting bedclothes, sheets and blankets)?: None Difficulty moving from lying on back to sitting on the side of the bed? : None Difficulty sitting down on and  standing up from a chair with arms (e.g., wheelchair, bedside commode, etc,.)?: None Help needed moving to and from a bed to chair (including a wheelchair)?: A Little Help needed walking in hospital room?: A Little Help needed climbing 3-5 steps with a railing? : A Little 6 Click Score: 21    End of Session Equipment Utilized During Treatment: Oxygen Activity Tolerance: Patient limited by fatigue;Treatment limited secondary to medical complications (Comment)(HR 123, SpO2 87% on 3L O2 with mobility) Patient left: in bed;with call bell/phone within reach   PT Visit Diagnosis: Difficulty in walking, not elsewhere classified (R26.2)    Time: 1610-9604 PT Time Calculation (min) (ACUTE ONLY): 14 min   Charges:   PT Evaluation $PT Eval Moderate Complexity: 1 Mod     PT G CodesSheran Lawless,  [phone] 06/17/2017   Elray Mcgregor 06/17/2017, 4:52 PM

## 2017-06-18 MED ORDER — METHYLPREDNISOLONE SODIUM SUCC 125 MG IJ SOLR
60.0000 mg | INTRAMUSCULAR | Status: DC
  Administered 2017-06-19: 60 mg via INTRAVENOUS
  Filled 2017-06-18: qty 2

## 2017-06-18 MED ORDER — IPRATROPIUM-ALBUTEROL 0.5-2.5 (3) MG/3ML IN SOLN: 3.0000 mL | RESPIRATORY_TRACT | Status: DC | PRN

## 2017-06-18 MED ORDER — PANTOPRAZOLE SODIUM 40 MG PO TBEC
40.0000 mg | DELAYED_RELEASE_TABLET | Freq: Two times a day (BID) | ORAL | Status: DC
  Administered 2017-06-18 – 2017-06-20 (×5): 40 mg via ORAL
  Filled 2017-06-18 (×5): qty 1

## 2017-06-18 NOTE — Progress Notes (Signed)
Patient ID: Matthew Morrison Gulla, male   DOB: 01-15-1956, 62 y.o.   MRN: [id number]  PROGRESS NOTE    Matthew Morrison Dil[place]to  WUJ:[id number] DOB: 01-15-1956 DOA: 06/08/2017  PCP: Bing NeighborsHarris, Kimberly S, FNP   Brief Narrative:  62 year old male with a past medical history of gold stage IV COPD, prior tobacco abuse, hypertension who is on chronic steroids at home presented with 3-day history of shortness of breath and is admitted for a COPD exacerbation.    Assessment & Plan:   Active Problems: Acute exacerbation of chronic obstructive pulmonary disease  - on 2 L O2 at baseline / Acute respiratory failure with hypoxia and hypercapnia / COPD GOLD IVhello - Still wheezing this am but has some improvement since yesterday - we will continue DuoNeb every 6 hours scheduled and add DuoNeb every 4 hours as needed for shortness of breath or wheezing - we can reduce Solu-Medrol from every 6 hours down to every 24 hours, continue with the same dose, 60 mg IV - Continue oxygen support via [place] to keep O2 sats above 90% - continue antitussives as needed  Essential hypertension  - Continue Norvasc 5 mg daily    DVT prophylaxis: Lovenox subQ Code Status: full code  Family Communication: no family at bedside Disposition Plan: to skilled nursing facility once respiratory status improves. Social work consulted to help with placement    Consultants:   None   Procedures:  None   Antimicrobials:   Levaquin    Subjective: No overnight events.  Objective: Vitals:   06/18/17 0206 06/18/17 0304 06/18/17 0508 06/18/17 0849  BP:   (!) 137/99   Pulse:  (!) 112 92   Resp:   12   Temp:   98.4 F (36.9 C)   TempSrc:   Oral   SpO2: 93% 95% 93% 94%  Weight:      Height:        Intake/Output Summary (Last 24 hours) at 06/18/2017 1124 Last data filed at 06/17/2017 1700 Gross per 24 hour  Intake 240 ml  Output -  Net 240 ml   Filed Weights   06/08/17 1732  Weight: 58.4 kg (128 lb 12 oz)    Physical Exam    Constitutional: Appears well-developed and well-nourished. No distress.  CVS: RRR, S1/S2 +, Pulmonary: Wheezing in upper and mid lung lobes, no rhonchi Abdominal: Soft. BS +,  no distension, tenderness, rebound or guarding.  Musculoskeletal: Normal range of motion. No edema and no tenderness.  Lymphadenopathy: No lymphadenopathy noted, cervical, inguinal. Neuro: Alert. Normal reflexes, muscle tone coordination. No cranial nerve deficit. Skin: Skin is warm and dry. No rash noted. Not diaphoretic. No erythema. No pallor.  Psychiatric: Normal mood and affect. Behavior, judgment, thought content normal.     Data Reviewed: I have personally reviewed following labs and imaging studies  CBC: Recent Labs  Lab 06/12/17 0412 06/16/17 0347  WBC 6.6 7.0  HGB 11.3* 12.6*  HCT 36.1* 39.4  MCV 75.8* 75.3*  PLT 301 333   Basic Metabolic Panel: Recent Labs  Lab 06/12/17 0412 06/16/17 0347  NA 137 139  K 4.2 4.5  CL 94* 95*  CO2 35* 34*  GLUCOSE 134* 150*  BUN 17 23*  CREATININE 0.67 0.65  CALCIUM 8.8* 9.0   GFR: Estimated Creatinine Clearance: 82.1 mL/min (by C-G formula based on SCr of 0.65 mg/dL). Liver Function Tests: No results for input(s): AST, ALT, ALKPHOS, BILITOT, PROT, ALBUMIN in the last 168 hours. No results for input(s): LIPASE, AMYLASE in  the last 168 hours. No results for input(s): AMMONIA in the last 168 hours. Coagulation Profile: No results for input(s): INR, PROTIME in the last 168 hours. Cardiac Enzymes: No results for input(s): CKTOTAL, CKMB, CKMBINDEX, TROPONINI in the last 168 hours. BNP (last 3 results) No results for input(s): PROBNP in the last 8760 hours. HbA1C: No results for input(s): HGBA1C in the last 72 hours. CBG: No results for input(s): GLUCAP in the last 168 hours. Lipid Profile: No results for input(s): CHOL, HDL, LDLCALC, TRIG, CHOLHDL, LDLDIRECT in the last 72 hours. Thyroid Function Tests: No results for input(s): TSH, T4TOTAL,  FREET4, T3FREE, THYROIDAB in the last 72 hours. Anemia Panel: No results for input(s): VITAMINB12, FOLATE, FERRITIN, TIBC, IRON, RETICCTPCT in the last 72 hours. Urine analysis:    Component Value Date/Time   COLORURINE STRAW (A) 09/20/2016 1216   APPEARANCEUR CLEAR 09/20/2016 1216   LABSPEC 1.015 05/10/2017 1002   PHURINE 7.0 05/10/2017 1002   GLUCOSEU NEGATIVE 05/10/2017 1002   HGBUR NEGATIVE 05/10/2017 1002   BILIRUBINUR NEGATIVE 05/10/2017 1002   KETONESUR NEGATIVE 05/10/2017 1002   PROTEINUR NEGATIVE 05/10/2017 1002   UROBILINOGEN 0.2 05/10/2017 1002   NITRITE NEGATIVE 05/10/2017 1002   LEUKOCYTESUR NEGATIVE 05/10/2017 1002   Sepsis Labs: @LABRCNTIP (procalcitonin:4,lacticidven:4)   )No results found for this or any previous visit (from the past 240 hour(s)).    Radiology Studies: No results found.   Scheduled Meds: . amLODipine  5 mg Oral Daily  . azelastine  2 spray Each Nare BID  . benzonatate  100 mg Oral TID  . budesonide (PULMICORT) nebulizer solution  0.5 mg Nebulization BID  . enoxaparin (LOVENOX) injection  40 mg Subcutaneous Q24H  . feeding supplement (ENSURE ENLIVE)  237 mL Oral TID BM  . fluticasone  2 spray Each Nare BID  . guaiFENesin  1,200 mg Oral BID  . ipratropium-albuterol  3 mL Nebulization Q6H  . methylPREDNISolone (SOLU-MEDROL) injection  60 mg Intravenous Q6H  . morphine  7.5 mg Oral TID  . pantoprazole  40 mg Oral BID AC   Continuous Infusions:   LOS: 9 days    Time spent: 25 minutes  Greater than 50% of the time spent on counseling and coordinating the care.   Manson Passey, MD [facility] Pager [phone]  If 7PM-7AM, please contact night-coverage [email or web address] Password [facility] 06/18/2017, 11:24 AM

## 2017-06-18 NOTE — NC FL2 (Signed)
[place] MEDICAID FL2 LEVEL OF CARE SCREENING TOOL     IDENTIFICATION  Patient Name: Matthew Morrison Birthdate: 09-25-1955 Sex: male Admission Date (Current Location): 06/08/2017  [facility] and [place] Number:  Producer, television/film/video and Address:  [facility],  [address] [place], [place] [address]      Provider Number: [id number]  Attending Physician Name and Address:  Alison Murray, MD  Relative Name and Phone Number:       Current Level of Care: Hospital Recommended Level of Care: Skilled Nursing Facility Prior Approval Number:    Date Approved/Denied:   PASRR Number: [id number]  Discharge Plan: SNF    Current Diagnoses: Patient Active Problem List   Diagnosis Date Noted  . Acute respiratory failure with hypoxia and hypercapnia (HCC)   . Acute exacerbation of chronic obstructive pulmonary disease (COPD) (HCC) 06/09/2017  . Protein-calorie malnutrition, severe 11/21/2016  . Hyperkalemia 11/19/2016  . COPD with acute exacerbation (HCC) 11/19/2016  . COPD GOLD IV 09/19/2016  . Essential hypertension 09/19/2016  . Hypercholesterolemia 09/19/2016    Orientation RESPIRATION BLADDER Height & Weight     Self, Situation, Place, Time  O2(Nasal Cannula 2L) Continent Weight: 128 lb 12 oz (58.4 kg) Height:  5\' 4"  (162.6 cm)  BEHAVIORAL SYMPTOMS/MOOD NEUROLOGICAL BOWEL NUTRITION STATUS      Continent Diet  AMBULATORY STATUS COMMUNICATION OF NEEDS Skin   Supervision Verbally                         Personal Care Assistance Level of Assistance  Bathing, Feeding, Dressing Bathing Assistance: Independent Feeding assistance: Independent Dressing Assistance: Independent     Functional Limitations Info  Sight, Hearing, Speech Sight Info: Adequate Hearing Info: Adequate Speech Info: Adequate    SPECIAL CARE FACTORS FREQUENCY  PT (By licensed PT), OT (By licensed OT)     PT Frequency: 5x week OT Frequency: 5x week            Contractures       Additional Factors Info  Code Status, Allergies Code Status Info: Full Allergies Info: NORFLEX ORPHENADRINE            Current Medications (06/18/2017):  This is the current hospital active medication list Current Facility-Administered Medications  Medication Dose Route Frequency Provider Last Rate Last Dose  . acetaminophen (TYLENOL) tablet 650 mg  650 mg Oral Q6H PRN Purohit, Shrey C, MD       Or  . acetaminophen (TYLENOL) suppository 650 mg  650 mg Rectal Q6H PRN Purohit, Shrey C, MD      . albuterol (PROVENTIL) (2.5 MG/3ML) 0.083% nebulizer solution 2.5 mg  2.5 mg Nebulization Q4H PRN Purohit, Shrey C, MD   2.5 mg at 06/10/17 0908  . amLODipine (NORVASC) tablet 5 mg  5 mg Oral Daily [name], MD   5 mg at 06/17/17 1031  . azelastine (ASTELIN) 0.1 % nasal spray 2 spray  2 spray Each Nare BID Simonne Martinet, NP   2 spray at 06/17/17 2104  . benzonatate (TESSALON) capsule 100 mg  100 mg Oral TID [name], MD   100 mg at 06/17/17 2103  . budesonide (PULMICORT) nebulizer solution 0.5 mg  0.5 mg Nebulization BID [name], MD   0.5 mg at 06/18/17 0849  . chlorpheniramine-HYDROcodone (TUSSIONEX) 10-8 MG/5ML suspension 5 mL  5 mL Oral Q12H PRN Oretha Milch, MD      . enoxaparin (LOVENOX) injection 40 mg  40  mg Subcutaneous Q24H Purohit, Shrey C, MD   40 mg at 06/17/17 1935  . feeding supplement (ENSURE ENLIVE) (ENSURE ENLIVE) liquid 237 mL  237 mL Oral TID BM Osvaldo ShipperKrishnan, Gokul, MD   237 mL at 06/17/17 1935  . fluticasone (FLONASE) 50 MCG/ACT nasal spray 2 spray  2 spray Each Nare BID Simonne MartinetBabcock, Peter E, NP   2 spray at 06/17/17 2104  . guaiFENesin (MUCINEX) 12 hr tablet 1,200 mg  1,200 mg Oral BID Calvert Cantorizwan, Saima, MD   1,200 mg at 06/17/17 2103  . hydrOXYzine (ATARAX/VISTARIL) tablet 50 mg  50 mg Oral TID PRN Delaine LamePurohit, Shrey C, MD   50 mg at 06/17/17 2104  . ipratropium-albuterol (DUONEB) 0.5-2.5 (3) MG/3ML nebulizer solution 3 mL  3 mL Nebulization Q6H Rizwan, Saima, MD    3 mL at 06/18/17 0849  . methylPREDNISolone sodium succinate (SOLU-MEDROL) 125 mg/2 mL injection 60 mg  60 mg Intravenous Q6H Simonne MartinetBabcock, Peter E, NP   60 mg at 06/18/17 0509  . morphine (MSIR) tablet 7.5 mg  7.5 mg Oral TID Oretha MilchAlva, Rakesh V, MD   7.5 mg at 06/17/17 2104  . ondansetron (ZOFRAN) tablet 4 mg  4 mg Oral Q6H PRN Purohit, Shrey C, MD       Or  . ondansetron (ZOFRAN) injection 4 mg  4 mg Intravenous Q6H PRN Purohit, Shrey C, MD      . pantoprazole (PROTONIX) injection 40 mg  40 mg Intravenous Q12H Simonne MartinetBabcock, Peter E, NP   40 mg at 06/17/17 2104  . polyethylene glycol (MIRALAX / GLYCOLAX) packet 17 g  17 g Oral Daily PRN Purohit, Salli QuarryShrey C, MD   17 g at 06/15/17 2219  . traMADol (ULTRAM) tablet 50 mg  50 mg Oral Q6H PRN Purohit, Salli QuarryShrey C, MD   50 mg at 06/09/17 2245     Discharge Medications: Please see discharge summary for a list of discharge medications.  Relevant Imaging Results:  Relevant Lab Results:   Additional Information SS#: [id number]  Tresa MoorePatricia V Blu Mcglaun, LCSW

## 2017-06-19 ENCOUNTER — Inpatient Hospital Stay (HOSPITAL_COMMUNITY): Payer: Medicaid Other

## 2017-06-19 DIAGNOSIS — E7800 Pure hypercholesterolemia, unspecified: Secondary | ICD-10-CM

## 2017-06-19 DIAGNOSIS — R0600 Dyspnea, unspecified: Secondary | ICD-10-CM

## 2017-06-19 DIAGNOSIS — R0603 Acute respiratory distress: Secondary | ICD-10-CM

## 2017-06-19 DIAGNOSIS — Z515 Encounter for palliative care: Secondary | ICD-10-CM

## 2017-06-19 DIAGNOSIS — J449 Chronic obstructive pulmonary disease, unspecified: Secondary | ICD-10-CM

## 2017-06-19 DIAGNOSIS — Z590 Homelessness: Secondary | ICD-10-CM

## 2017-06-19 LAB — TROPONIN I

## 2017-06-19 LAB — COMPREHENSIVE METABOLIC PANEL
ALBUMIN: 3.3 g/dL — AB (ref 3.5–5.0)
ALT: 22 U/L (ref 17–63)
ANION GAP: 8 (ref 5–15)
AST: 26 U/L (ref 15–41)
Alkaline Phosphatase: 44 U/L (ref 38–126)
BILIRUBIN TOTAL: 0.7 mg/dL (ref 0.3–1.2)
BUN: 27 mg/dL — ABNORMAL HIGH (ref 6–20)
CO2: 35 mmol/L — ABNORMAL HIGH (ref 22–32)
Calcium: 8.9 mg/dL (ref 8.9–10.3)
Chloride: 95 mmol/L — ABNORMAL LOW (ref 101–111)
Creatinine, Ser: 0.64 mg/dL (ref 0.61–1.24)
GFR calc Af Amer: >60 mL/min (ref >60)
GFR calc non Af Amer: >60 mL/min (ref >60)
GLUCOSE: 118 mg/dL — AB (ref 65–99)
POTASSIUM: 5.2 mmol/L — AB (ref 3.5–5.1)
Sodium: 138 mmol/L (ref 135–145)
TOTAL PROTEIN: 6.2 g/dL — AB (ref 6.5–8.1)

## 2017-06-19 LAB — BLOOD GAS, ARTERIAL
ACID-BASE EXCESS: 10.5 mmol/L — AB (ref 0.0–2.0)
Bicarbonate: 35.5 mmol/L — ABNORMAL HIGH (ref 20.0–28.0)
DRAWN BY: 270211
O2 SAT: 71.6 %
PATIENT TEMPERATURE: 98.6
pCO2 arterial: 48.6 mmHg — ABNORMAL HIGH (ref 32.0–48.0)
pH, Arterial: 7.477 — ABNORMAL HIGH (ref 7.350–7.450)
pO2, Arterial: 38.1 mmHg — CL (ref 83.0–108.0)

## 2017-06-19 LAB — CBC
HEMATOCRIT: 43.2 % (ref 39.0–52.0)
Hemoglobin: 13.8 g/dL (ref 13.0–17.0)
MCH: 24.2 pg — ABNORMAL LOW (ref 26.0–34.0)
MCHC: 31.9 g/dL (ref 30.0–36.0)
MCV: 75.8 fL — ABNORMAL LOW (ref 78.0–100.0)
PLATELETS: 276 10*3/uL (ref 150–400)
RBC: 5.7 MIL/uL (ref 4.22–5.81)
RDW: 14 % (ref 11.5–15.5)
WBC: 13.1 10*3/uL — ABNORMAL HIGH (ref 4.0–10.5)

## 2017-06-19 MED ORDER — IPRATROPIUM-ALBUTEROL 0.5-2.5 (3) MG/3ML IN SOLN: 3.0000 mL | RESPIRATORY_TRACT | Status: DC | PRN

## 2017-06-19 MED ORDER — SODIUM CHLORIDE 3 % IN NEBU
4.0000 mL | INHALATION_SOLUTION | Freq: Three times a day (TID) | RESPIRATORY_TRACT | Status: DC
  Administered 2017-06-19 – 2017-06-20 (×4): 4 mL via RESPIRATORY_TRACT
  Filled 2017-06-19 (×6): qty 4

## 2017-06-19 MED ORDER — MORPHINE SULFATE (PF) 2 MG/ML IV SOLN
1.0000 mg | Freq: Once | INTRAVENOUS | Status: AC
  Administered 2017-06-19: 1 mg via INTRAVENOUS
  Filled 2017-06-19: qty 1

## 2017-06-19 MED ORDER — MORPHINE BOLUS VIA INFUSION: 1.0000 mg | Freq: Once | INTRAVENOUS | Status: DC

## 2017-06-19 MED ORDER — METHYLPREDNISOLONE SODIUM SUCC 125 MG IJ SOLR
60.0000 mg | Freq: Three times a day (TID) | INTRAMUSCULAR | Status: DC
  Administered 2017-06-19 – 2017-06-20 (×4): 60 mg via INTRAVENOUS
  Filled 2017-06-19 (×4): qty 2

## 2017-06-19 NOTE — Progress Notes (Signed)
PT demonstrated hands on understanding of Flutter device- NPC at this time. 

## 2017-06-19 NOTE — Progress Notes (Signed)
Daily Progress Note   Patient Name: Matthew Morrison       Date: 06/19/2017 DOB: 1955/08/27  Age: 62 y.o. MRN#: [id number] Attending Physician: Kayleen Memos, DO Primary Care Physician: Scot Jun, FNP Admit Date: 06/08/2017  Reason for Consultation/Follow-up: Establishing goals of care  Subjective: I met today with Mr. Matthew Morrison.  Reports that he continues to be short of breath.  We discussed his concerns about having place to live after discharge as well as recommendation to consider SNF if eligible.  He reports that this is what he would prefer and is agreeable to palliative following him there. He does not want to enroll in hospice at this point.  Length of Stay: 10  Current Medications: Scheduled Meds:  . amLODipine  5 mg Oral Daily  . azelastine  2 spray Each Nare BID  . benzonatate  100 mg Oral TID  . enoxaparin (LOVENOX) injection  40 mg Subcutaneous Q24H  . feeding supplement (ENSURE ENLIVE)  237 mL Oral TID BM  . fluticasone  2 spray Each Nare BID  . guaiFENesin  1,200 mg Oral BID  . ipratropium-albuterol  3 mL Nebulization Q6H  . methylPREDNISolone (SOLU-MEDROL) injection  60 mg Intravenous TID  . morphine  7.5 mg Oral TID  . pantoprazole  40 mg Oral BID AC  . sodium chloride HYPERTONIC  4 mL Nebulization TID    Continuous Infusions:   PRN Meds: acetaminophen **OR** acetaminophen, chlorpheniramine-HYDROcodone, hydrOXYzine, ipratropium-albuterol, ondansetron **OR** ondansetron (ZOFRAN) IV, polyethylene glycol, traMADol  Physical Exam         General: Alert, awake, in no acute distress. 4 word dyspnea noted while sitting on side of bed. HEENT: No bruits, no goiter, no JVD Heart: Tachycardic. No murmur appreciated Lungs: dminished air movement Abdomen: Soft, nontender,  nondistended, positive bowel sounds.  Ext: No significant edema Skin: Warm and dry Neuro: Grossly intact, nonfocal.   Vital Signs: BP (!) 138/105 (BP Location: Left Arm)   Pulse (!) 114   Temp 98.1 F (36.7 C) (Oral)   Resp 16   Ht 5' 4"  (1.626 m)   Wt 58.4 kg (128 lb 12 oz)   SpO2 94%   BMI 22.10 kg/m  SpO2: SpO2: 94 % O2 Device: O2 Device: Nasal Cannula O2 Flow Rate: O2 Flow Rate (L/min): 2 L/min  Intake/output  summary:   Intake/Output Summary (Last 24 hours) at 06/19/2017 1544 Last data filed at 06/19/2017 1200 Gross per 24 hour  Intake 480 ml  Output -  Net 480 ml   LBM: Last BM Date: 06/19/17 Baseline Weight: Weight: 58.4 kg (128 lb 12 oz) Most recent weight: Weight: 58.4 kg (128 lb 12 oz)       Palliative Assessment/Data:      Patient Active Problem List   Diagnosis Date Noted  . Acute respiratory failure with hypoxia and hypercapnia (HCC)   . Acute exacerbation of chronic obstructive pulmonary disease (COPD) ([place]) 06/09/2017  . Protein-calorie malnutrition, severe 11/21/2016  . Hyperkalemia 11/19/2016  . COPD with acute exacerbation ([place]) 11/19/2016  . COPD GOLD IV 09/19/2016  . Essential hypertension 09/19/2016  . Hypercholesterolemia 09/19/2016    Palliative Care Assessment & Plan   Patient Profile: 62 y.o. male  with past medical history of end stage COPD admitted on 06/08/2017 with COPD exacerbation.  Palliative consulted for goals of care.   Recommendations/Plan:  Securing safe place for him to live after discharge continues to be of primary concern.  Agree with plan for SNF with palliative to follow if this can be arranged.  He had told me he would like to name his brother as Air traffic controller.  I will consult spiritual care to assist him in this.  Goals of Care and Additional Recommendations:  Limitations on Scope of Treatment: Full Scope Treatment  Code Status:    Code Status Orders  (From admission, onward)        Start      Ordered   06/08/17 1624  Full code  Continuous     06/08/17 1623    Code Status History    Date Active Date Inactive Code Status Order ID Comments User Context   11/19/2016 22:41 11/22/2016 19:00 Full Code [id number]  Vianne Bulls, MD ED   11/02/2016 02:03 11/03/2016 19:40 Full Code [id number]  Reubin Milan, MD Inpatient   09/19/2016 10:04 09/26/2016 19:09 Full Code [id number]  Bonnielee Haff, MD Inpatient       Prognosis:   Unable to determine  Discharge Planning:  [place] for rehab with Palliative care service follow-up if it can be arranged  Care plan was discussed with Patient  Thank you for allowing the Palliative Medicine Team to assist in the care of this patient.   Total Time 20 Prolonged Time Billed No      Greater than 50%  of this time was spent counseling and coordinating care related to the above assessment and plan.  Micheline Rough, MD  Please contact Palliative Medicine Team phone at [phone] for questions and concerns.

## 2017-06-19 NOTE — Progress Notes (Signed)
Physical Therapy Treatment Patient Details Name: Matthew Morrison MRN: [id number] DOB: [birth year] Today's Date: 06/19/2017    History of Present Illness 62 year old male with a past medical history of gold stage IV COPD, prior tobacco abuse, hypertension who is on chronic steroids at home presented with 3-day history of shortness of breath and is admitted for a COPD exacerbation.     PT Comments    Assisted OOB to amb a limited distance.  Remained on 2 lts with avg sats 92% however very limited activity tolerance.  Required several rest breaks and demonstrated 3/4 DOE.  Pt stated "this is way worse" from prior to admission.  Pt will need ST Rehab at SNF.   Follow Up Recommendations  SNF     Equipment Recommendations  None recommended by PT    Recommendations for Other Services       Precautions / Restrictions Precautions Precaution Comments: home O2 2 lts Restrictions Weight Bearing Restrictions: No    Mobility  Bed Mobility Overal bed mobility: Modified Independent             General bed mobility comments: increased time  Transfers Overall transfer level: Modified independent               General transfer comment: good use of hands and safety cognition  Ambulation/Gait Ambulation/Gait assistance: Supervision;Min guard Ambulation Distance (Feet): 48 Feet(x 4 standing rest breaks) Assistive device: None(but did lean on rail three times) Gait Pattern/deviations: Step-through pattern;Decreased stride length;Drifts right/left Gait velocity: decreased   General Gait Details: limited due to dyspnea 3/4 requiring freq rest breaks and extended time to recover.  Pt stated "this is way worse" than prior at home.     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 : A Little Difficulty sitting down on and standing up from a chair  with arms (e.g., wheelchair, bedside commode, etc,.)?: A Little Help needed moving to and from a bed to chair (including a wheelchair)?: A Little Help needed walking in hospital room?: A Little Help needed climbing 3-5 steps with a railing? : A Lot 6 Click Score: 17    End of Session Equipment Utilized During Treatment: Oxygen Activity Tolerance: Patient limited by fatigue;Treatment limited secondary to medical complications (Comment) Patient left: in bed;with call bell/phone within reach   PT Visit Diagnosis: Difficulty in walking, not elsewhere classified (R26.2)     Time: [id number] PT Time Calculation (min) (ACUTE ONLY): 10 min  Charges:  $Gait Training: 8-22 mins                    G Codes:       Felecia ShellingLori Si Jachim  PTA [facility]  Acute  Rehab Pager      [phone]

## 2017-06-19 NOTE — Progress Notes (Addendum)
CSW following to assist with disposition. See assessment 06/17/17.  PT evaluated pt this weekend and SNF recommended. CSW discussed this with pt and he reports he would be agreeable to facility placement. Discussed this requires pt sign over SS check to facility and Medicaid requirement to stay 30+ days. Pt agreeable and open to having to seek bed outside of [place] if needed. Also followed up on discussion about pt possibly wanting hospice services. Pt states, "I don't think I'm ready for that, I want to try and see if I can have some more time first." Would want palliative care to follow at SNF. Previous SW competed [employer] and made referrals to area SNFs. Today, CSW expanded bed search and will follow up with bed offers.  Ilean SkillMeghan Aldea Avis, MSW, LCSW Clinical Social Work 06/19/2017 [phone]  16:49-Provided SNF bed offers to pt. He states he will consider options and CSW will check back in the a.m. For decision.

## 2017-06-19 NOTE — Progress Notes (Signed)
PT demonstrated hands on understanding of Flutter device. Has strong cough at this time, NP.

## 2017-06-19 NOTE — Progress Notes (Signed)
PROGRESS NOTE  Matthew Morrison ZOX:[id number] DOB: [birth year] DOA: 06/08/2017 PCP: Matthew NeighborsHarris, Kimberly S, FNP  HPI/Recap of past 2324 hours: 62 year old male with a past medical history of gold stage IV COPD, prior tobacco abuse, hypertension who is on chronic steroids at home presented with 3-day history of shortness of breath and is admitted for a COPD exacerbation.   06/19/2017: Patient seen and examined at his bedside.  He appears anxious.  Reports dyspnea at rest and chest discomfort.  ABG ordered, chest x-ray, EKG and troponin.  Given 1 dose of IV morphine 1 mg for symptomatic relief.  Assessment/Plan: Active Problems:   COPD GOLD IV   Hypercholesterolemia   COPD with acute exacerbation (HCC)   Acute exacerbation of chronic obstructive pulmonary disease (COPD) (HCC)   Acute respiratory failure with hypoxia and hypercapnia (HCC)  Acute on chronic hypoxic respiratory failure secondary to acute COPD exacerbation Continue IV steroids Continue duo nebs every 6 hours and every 2 hours as needed Started aggressive pulmonary toilet with hypersaline nebs and chest PT 3 times daily Continue O2 supplementation to maintain O2 saturation greater than 92% On 2 L of oxygen by nasal cannula nightly prior to admission  Acute COPD exacerbation Management as stated above  Tobacco use disorder Tobacco cessation counseling at bedside  Hypertension Pressures well controlled Continue Norvasc 5 mg daily  Anxiety On hydroxyzine  Goal of care Palliative care team consulted  Homelessness Case manager to assist with medications Social worker to assist with placement   Code Status: Full  Family Communication: None at bedside  Disposition Plan: Home when hemodynamically stable   Consultants:  Palliative care team  Procedures:  None  Antimicrobials:  None  DVT prophylaxis: SCDs, subcu Lovenox daily  Objective: Vitals:   06/18/17 1955 06/18/17 2017 06/19/17 0111 06/19/17 0537  BP:  (!) 135/97   (!) 122/94  Pulse: (!) 125   95  Resp: 20   16  Temp: 98.1 F (36.7 C)   (!) 96.6 F (35.9 C)  TempSrc: Oral   Oral  SpO2: 95% 94% 93% 96%  Weight:      Height:        Intake/Output Summary (Last 24 hours) at 06/19/2017 0820 Last data filed at 06/18/2017 1300 Gross per 24 hour  Intake 480 ml  Output -  Net 480 ml   Filed Weights   06/08/17 1732  Weight: 58.4 kg (128 lb 12 oz)    Exam:   General: 62 year old African-American male well-developed well-nourished.  He appears uncomfortable due to dyspnea at rest.  He is alert oriented x3  Cardiovascular: Regular rate and rhythm with no rubs or gallops  Respiratory: Mild rales at bases with diffuse wheezes bilaterally  Abdomen: Soft nontender nondistended normal bowel sounds x4  Musculoskeletal: No focal deficit  Skin: No rash  Psychiatry: Mood is anxious   Data Reviewed: CBC: Recent Labs  Lab 06/16/17 0347  WBC 7.0  HGB 12.6*  HCT 39.4  MCV 75.3*  PLT 333   Basic Metabolic Panel: Recent Labs  Lab 06/16/17 0347  NA 139  K 4.5  CL 95*  CO2 34*  GLUCOSE 150*  BUN 23*  CREATININE 0.65  CALCIUM 9.0   GFR: Estimated Creatinine Clearance: 82.1 mL/min (by C-G formula based on SCr of 0.65 mg/dL). Liver Function Tests: No results for input(Morrison): AST, ALT, ALKPHOS, BILITOT, PROT, ALBUMIN in the last 168 hours. No results for input(Morrison): LIPASE, AMYLASE in the last 168 hours. No results for input(Morrison): AMMONIA  in the last 168 hours. Coagulation Profile: No results for input(Morrison): INR, PROTIME in the last 168 hours. Cardiac Enzymes: No results for input(Morrison): CKTOTAL, CKMB, CKMBINDEX, TROPONINI in the last 168 hours. BNP (last 3 results) No results for input(Morrison): PROBNP in the last 8760 hours. HbA1C: No results for input(Morrison): HGBA1C in the last 72 hours. CBG: No results for input(Morrison): GLUCAP in the last 168 hours. Lipid Profile: No results for input(Morrison): CHOL, HDL, LDLCALC, TRIG, CHOLHDL, LDLDIRECT in the  last 72 hours. Thyroid Function Tests: No results for input(Morrison): TSH, T4TOTAL, FREET4, T3FREE, THYROIDAB in the last 72 hours. Anemia Panel: No results for input(Morrison): VITAMINB12, FOLATE, FERRITIN, TIBC, IRON, RETICCTPCT in the last 72 hours. Urine analysis:    Component Value Date/Time   COLORURINE STRAW (A) 09/20/2016 1216   APPEARANCEUR CLEAR 09/20/2016 1216   LABSPEC 1.015 05/10/2017 1002   PHURINE 7.0 05/10/2017 1002   GLUCOSEU NEGATIVE 05/10/2017 1002   HGBUR NEGATIVE 05/10/2017 1002   BILIRUBINUR NEGATIVE 05/10/2017 1002   KETONESUR NEGATIVE 05/10/2017 1002   PROTEINUR NEGATIVE 05/10/2017 1002   UROBILINOGEN 0.2 05/10/2017 1002   NITRITE NEGATIVE 05/10/2017 1002   LEUKOCYTESUR NEGATIVE 05/10/2017 1002   Sepsis Labs: @LABRCNTIP (procalcitonin:4,lacticidven:4)  )No results found for this or any previous visit (from the past 240 hour(Morrison)).    Studies: No results found.  Scheduled Meds: . amLODipine  5 mg Oral Daily  . azelastine  2 spray Each Nare BID  . benzonatate  100 mg Oral TID  . enoxaparin (LOVENOX) injection  40 mg Subcutaneous Q24H  . feeding supplement (ENSURE ENLIVE)  237 mL Oral TID BM  . fluticasone  2 spray Each Nare BID  . guaiFENesin  1,200 mg Oral BID  . ipratropium-albuterol  3 mL Nebulization Q6H  . methylPREDNISolone (SOLU-MEDROL) injection  60 mg Intravenous TID  . morphine  7.5 mg Oral TID  . morphine  1 mg Intravenous Once  . pantoprazole  40 mg Oral BID AC  . sodium chloride HYPERTONIC  4 mL Nebulization TID    Continuous Infusions:   LOS: 10 days     Darlin Drop, MD [facility] Pager [phone]  If 7PM-7AM, please contact night-coverage [email or web address] Password [email or web address] 06/19/2017, 8:20 AM

## 2017-06-20 DIAGNOSIS — R262 Difficulty in walking, not elsewhere classified: Secondary | ICD-10-CM

## 2017-06-20 MED ORDER — PREDNISONE 10 MG PO TABS: 10.0000 mg | ORAL_TABLET | Freq: Every day | ORAL | 0 refills | Status: AC

## 2017-06-20 MED ORDER — ENSURE ENLIVE PO LIQD: 237.0000 mL | Freq: Three times a day (TID) | ORAL | 0 refills | Status: AC

## 2017-06-20 MED ORDER — AMLODIPINE BESYLATE 5 MG PO TABS: 5.0000 mg | ORAL_TABLET | Freq: Every day | ORAL | 0 refills | Status: AC

## 2017-06-20 NOTE — Discharge Instructions (Signed)
Chronic Obstructive Pulmonary Disease Chronic obstructive pulmonary disease (COPD) is a long-term (chronic) lung problem. When you have COPD, it is hard for air to get in and out of your lungs. The way your lungs work will never return to normal. Usually the condition gets worse over time. There are things you can do to keep yourself as healthy as possible. Your doctor may treat your condition with:  Medicines.  Quitting smoking, if you smoke.  Rehabilitation. This may involve a team of specialists.  Oxygen.  Exercise and changes to your diet.  Lung surgery.  Comfort measures (palliative care).  Follow these instructions at home: Medicines  Take over-the-counter and prescription medicines only as told by your doctor.  Talk to your doctor before taking any cough or allergy medicines. You may need to avoid medicines that cause your lungs to be dry. Lifestyle  If you smoke, stop. Smoking makes the problem worse. If you need help quitting, ask your doctor.  Avoid being around things that make your breathing worse. This may include smoke, chemicals, and fumes.  Stay active, but remember to also rest.  Learn and use tips on how to relax.  Make sure you get enough sleep. Most adults need at least 7 hours a night.  Eat healthy foods. Eat smaller meals more often. Rest before meals. Controlled breathing  Learn and use tips on how to control your breathing as told by your doctor. Try: ? Breathing in (inhaling) through your nose for 1 second. Then, pucker your lips and breath out (exhale) through your lips for 2 seconds. ? Putting one hand on your belly (abdomen). Breathe in slowly through your nose for 1 second. Your hand on your belly should move out. Pucker your lips and breathe out slowly through your lips. Your hand on your belly should move in as you breathe out. Controlled coughing  Learn and use controlled coughing to clear mucus from your lungs. The steps are: 1. Lean your  head a little forward. 2. Breathe in deeply. 3. Try to hold your breath for 3 seconds. 4. Keep your mouth slightly open while coughing 2 times. 5. Spit any mucus out into a tissue. 6. Rest and do the steps again 1 or 2 times as needed. General instructions  Make sure you get all the shots (vaccines) that your doctor recommends. Ask your doctor about a flu shot and a pneumonia shot.  Use oxygen therapy and therapy to help improve your lungs (pulmonary rehabilitation) if told by your doctor. If you need home oxygen therapy, ask your doctor if you should buy a tool to measure your oxygen level (oximeter).  Make a COPD action plan with your doctor. This helps you know what to do if you feel worse than usual.  Manage any other conditions you have as told by your doctor.  Avoid going outside when it is very hot, cold, or humid.  Avoid people who have a sickness you can catch (contagious).  Keep all follow-up visits as told by your doctor. This is important. Contact a doctor if:  You cough up more mucus than usual.  There is a change in the color or thickness of the mucus.  It is harder to breathe than usual.  Your breathing is faster than usual.  You have trouble sleeping.  You need to use your medicines more often than usual.  You have trouble doing your normal activities such as getting dressed or walking around the house. Get help right away if:    You have shortness of breath while resting.  You have shortness of breath that stops you from: ? Being able to talk. ? Doing normal activities.  Your chest hurts for longer than 5 minutes.  Your skin color is more blue than usual.  Your pulse oximeter shows that you have low oxygen for longer than 5 minutes.  You have a fever.  You feel too tired to breathe normally. Summary  Chronic obstructive pulmonary disease (COPD) is a long-term lung problem.  The way your lungs work will never return to normal. Usually the  condition gets worse over time. There are things you can do to keep yourself as healthy as possible.  Take over-the-counter and prescription medicines only as told by your doctor.  If you smoke, stop. Smoking makes the problem worse. This information is not intended to replace advice given to you by your health care provider. Make sure you discuss any questions you have with your health care provider. Document Released: 09/07/2007 Document Revised: 08/27/2015 Document Reviewed: 11/15/2012 Elsevier Interactive Patient Education  2017 [employer]   Shortness of Breath, Adult Shortness of breath means you have trouble breathing. Your lungs are organs for breathing. Follow these instructions at home: Pay attention to any changes in your symptoms. Take these actions to help with your condition:  Do not smoke. Smoking can cause shortness of breath. If you need help to quit smoking, ask your doctor.  Avoid things that can make it harder to breathe, such as: ? Mold. ? Dust. ? Air pollution. ? Chemical smells. ? Things that can cause allergy symptoms (allergens), if you have allergies.  Keep your living space clean and free of mold and dust.  Rest as needed. Slowly return to your usual activities.  Take over-the-counter and prescription medicines, including oxygen and inhaled medicines, only as told by your doctor.  Keep all follow-up visits as told by your doctor. This is important.  Contact a doctor if:  Your condition does not get better as soon as expected.  You have a hard time doing your normal activities, even after you rest.  You have new symptoms. Get help right away if:  You have trouble breathing when you are resting.  You feel light-headed or you faint.  You have a cough that is not helped by medicines.  You cough up blood.  You have pain with breathing.  You have pain in your chest, arms, shoulders, or belly (abdomen).  You have a fever.  You cannot walk  up stairs.  You cannot exercise the way you normally do. This information is not intended to replace advice given to you by your health care provider. Make sure you discuss any questions you have with your health care provider. Document Released: 09/07/2007 Document Revised: 04/07/2016 Document Reviewed: 04/07/2016 Elsevier Interactive Patient Education  2017 [employer]

## 2017-06-20 NOTE — Progress Notes (Signed)
   06/20/17 1000  Clinical Encounter Type  Visited With Patient  Visit Type Initial;Psychological support;Spiritual support  Referral From Nurse  Consult/Referral To Chaplain  Spiritual Encounters  Spiritual Needs Prayer;Emotional;Other (Comment) (Advance Directive/ Spiritual Care Conversation)  Stress Factors  Patient Stress Factors Family relationships;Health changes;Major life changes  Advance Directives (For Healthcare)  Does Patient Have a Medical Advance Directive? No  Would patient like information on creating a medical advance directive? Yes (Inpatient - patient requests chaplain consult to create a medical advance directive) (Patient Given Advance Directive Information)   I visited with the patient per Spiritual Care consult for an Advance Directive. The patient stated that he wants to name is brother as his Public affairs consultantHealthcare Power of attorney, due to being estranged from his son.  The patient wanted to discuss ways that he could approach the conversation about his healthcare wishes with his brother. We discussed ways to do that.  The patient requested prayer. I prayed at the bedside.   Please, contact Spiritual Care for further assistance.   Chaplain Clint BolderBrittany Wilhemenia Camba M.Div. , [facility]

## 2017-06-20 NOTE — Progress Notes (Signed)
SATURATION QUALIFICATIONS: (This note is used to comply with regulatory documentation for home oxygen)  Patient Saturations on Room Air at Rest =94%  Patient Saturations on Room Air while Ambulating = 90%  Patient Saturations on0 Liters of oxygen while Ambulating =90%  Please briefly explain why patient needs home oxygen: 

## 2017-06-20 NOTE — Discharge Summary (Signed)
Discharge Summary  Matthew Morrison ZOX:[id number] DOB: 1955/11/13  PCP: Bing Neighbors, FNP  Admit date: 06/08/2017 Discharge date: 06/20/2017  Time spent: 25 minutes   Recommendations for Outpatient Follow-up:  1. Follow-up with PCP 2. Take your medications as prescribed 3. Fall precaution  Discharge Diagnoses:  Active Hospital Problems   Diagnosis Date Noted  . Acute respiratory failure with hypoxia and hypercapnia (HCC)   . Acute exacerbation of chronic obstructive pulmonary disease (COPD) (HCC) 06/09/2017  . COPD with acute exacerbation (HCC) 11/19/2016  . Hypercholesterolemia 09/19/2016  . COPD GOLD IV 09/19/2016    Resolved Hospital Problems  No resolved problems to display.    Discharge Condition: Stable  Diet recommendation: Resume previous diet  Vitals:   06/20/17 0433 06/20/17 0827  BP: (!) 134/102   Pulse: 97   Resp: 20   Temp: 97.9 F (36.6 C)   SpO2: 94% 90%    History of present illness:  62 year old male with a past medical history of gold stage IV COPD, prior tobacco abuse, hypertension who is on chronic steroids at home presented with 3-day history of shortness of breath and is admitted for a COPD exacerbation.   06/19/2017: Patient seen and examined at his bedside.  He appears anxious.  Reports dyspnea at rest and chest discomfort.  ABG ordered, chest x-ray, EKG and troponin.  Given 1 dose of IV morphine 1 mg for symptomatic relief.  06/20/2017: Patient seen and examined at his bedside.  He appears comfortable.  He denies chest pain or dyspnea.  Chest x-ray, ABG, troponin done on 06/19/2017 were unremarkable.  Hospital Course:  Active Problems:   COPD GOLD IV   Hypercholesterolemia   COPD with acute exacerbation (HCC)   Acute exacerbation of chronic obstructive pulmonary disease (COPD) (HCC)   Acute respiratory failure with hypoxia and hypercapnia (HCC)  Acute on chronic hypoxic respiratory failure secondary to acute COPD exacerbation Continue po  steroids Continue home meds Continue O2 supplementation to maintain O2 saturation greater than 92% On 2 L of oxygen by nasal cannula nightly prior to admission  Acute COPD exacerbation Management as stated above  Tobacco use disorder Tobacco cessation counseling at bedside Self reported quit smoking about 1 year ago.  Hypertension BP stable Continue Norvasc 5 mg daily  Anxiety Continue hydroxyzine  Goal of care Palliative care team consulted  Homelessness Case manager to assist with medications Social worker to assist with placement  Ambulatory dysfunction PT evaluated and recommended SNF fall precaution   Procedures:  None  Consultations:  None  Discharge Exam: BP (!) 134/102 (BP Location: Left Arm) Comment: patient's baseline since admit   Pulse 97   Temp 97.9 F (36.6 C) (Oral)   Resp 20   Ht 5\' 4"  (1.626 m)   Wt 58.4 kg (128 lb 12 oz)   SpO2 90%   BMI 22.10 kg/m   General: 62 year old African-American male thin build alert and oriented x3.  In no acute distress. Cardiovascular: Regular rate and rhythm with no rubs or gallops. Respiratory: Clear to auscultation with no wheezes or rales  Discharge Instructions You were cared for by a hospitalist during your hospital stay. If you have any questions about your discharge medications or the care you received while you were in the hospital after you are discharged, you can call the unit and asked to speak with the hospitalist on call if the hospitalist that took care of you is not available. Once you are discharged, your primary care physician will handle  any further medical issues. Please note that NO REFILLS for any discharge medications will be authorized once you are discharged, as it is imperative that you return to your primary care physician (or establish a relationship with a primary care physician if you do not have one) for your aftercare needs so that they can reassess your need for medications  and monitor your lab values.   Allergies as of 06/20/2017      Reactions   Norflex [orphenadrine] Hives      Medication List    STOP taking these medications   acetaminophen-codeine 300-30 MG tablet Commonly known as:  TYLENOL #3     TAKE these medications   acetaminophen 500 MG tablet Commonly known as:  TYLENOL Take 1,000 mg by mouth daily as needed for headache.   amLODipine 5 MG tablet Commonly known as:  NORVASC Take 1 tablet (5 mg total) by mouth daily. Start taking on:  06/21/2017   budesonide 0.25 MG/2ML nebulizer solution Commonly known as:  PULMICORT Take 2 mLs (0.25 mg total) by nebulization 2 (two) times daily.   famotidine 20 MG tablet Commonly known as:  PEPCID Take 1 tablet (20 mg total) by mouth at bedtime.   feeding supplement (ENSURE ENLIVE) Liqd Take 237 mLs by mouth 3 (three) times daily between meals.   hydrOXYzine 10 MG tablet Commonly known as:  ATARAX/VISTARIL TAKE 1 TABLET BY MOUTH 3 TIMES DAILY AS NEEDED FOR ANXIETY.   ipratropium-albuterol 0.5-2.5 (3) MG/3ML Soln Commonly known as:  DUONEB Take 3 mLs by nebulization every 6 (six) hours as needed.   mupirocin cream 2 % Commonly known as:  BACTROBAN Apply topically 2 (two) times daily.   omeprazole 40 MG capsule Commonly known as:  PRILOSEC Take 1 capsule (40 mg total) by mouth daily.   OXYGEN 2 lpm with sleep and occ during the day   polyethylene glycol packet Commonly known as:  MIRALAX Take 17 g by mouth 2 (two) times daily.   predniSONE 10 MG tablet Commonly known as:  DELTASONE Take 1 tablet (10 mg total) by mouth daily with breakfast. What changed:    medication strength  how much to take   PROVENTIL HFA 108 (90 Base) MCG/ACT inhaler Generic drug:  albuterol Inhale 2 puffs into the lungs every 4 (four) hours as needed for wheezing or shortness of breath.   Tiotropium Bromide-Olodaterol 2.5-2.5 MCG/ACT Aers Commonly known as:  STIOLTO RESPIMAT Inhale 2 puffs into  the lungs daily.      Allergies  Allergen Reactions  . Norflex [Orphenadrine] Hives   Follow-up Information    Bing NeighborsHarris, Kimberly S, FNP Follow up.   Specialty:  Family Medicine Contact information: [address] [place] [address] [phone]            The results of significant diagnostics from this hospitalization (including imaging, microbiology, ancillary and laboratory) are listed below for reference.    Significant Diagnostic Studies: Dg Chest 2 View  Result Date: 06/08/2017 CLINICAL DATA:  Increasing shortness of breath and cough. EXAM: CHEST - 2 VIEW COMPARISON:  Chest x-ray dated January 07, 2017. FINDINGS: The heart size and mediastinal contours are within normal limits. Normal pulmonary vascularity. The lungs remain hyperinflated with emphysematous changes. Scarring in the right upper lobe is unchanged. No focal consolidation, pleural effusion, or pneumothorax. No acute osseous abnormality. IMPRESSION: COPD.  No active cardiopulmonary disease. Electronically Signed   By: Obie DredgeWilliam T Derry M.D.   On: 06/08/2017 08:27   [facility]  Result Date: 06/19/2017 CLINICAL DATA:  Dyspnea EXAM: PORTABLE CHEST 1 VIEW COMPARISON:  06/12/2017 FINDINGS: There is no focal parenchymal opacity. There is no pleural effusion or pneumothorax. The heart and mediastinal contours are unremarkable. The osseous structures are unremarkable. IMPRESSION: No active disease. Electronically Signed   By: Elige Ko   On: 06/19/2017 09:28   Dg Chest Port 1 View  Result Date: 06/12/2017 CLINICAL DATA:  Dyspnea.  History of asthma, hypertension, ex-smoker EXAM: PORTABLE CHEST 1 VIEW COMPARISON:  Chest x-rays dated 06/08/2017 and 01/07/2017 FINDINGS: Heart size and mediastinal contours are within normal limits. Lungs are hyperexpanded. Lungs are clear. No pleural effusion or pneumothorax seen. No acute or suspicious osseous finding. IMPRESSION: 1. No acute findings.  No evidence of pneumonia or  pulmonary edema. 2. COPD. Electronically Signed   By: Bary Richard M.D.   On: 06/12/2017 09:29    Microbiology: No results found for this or any previous visit (from the past 240 hour(s)).   Labs: Basic Metabolic Panel: Recent Labs  Lab 06/16/17 0347 06/19/17 0855  NA 139 138  K 4.5 5.2*  CL 95* 95*  CO2 34* 35*  GLUCOSE 150* 118*  BUN 23* 27*  CREATININE 0.65 0.64  CALCIUM 9.0 8.9   Liver Function Tests: Recent Labs  Lab 06/19/17 0855  AST 26  ALT 22  ALKPHOS 44  BILITOT 0.7  PROT 6.2*  ALBUMIN 3.3*   No results for input(s): LIPASE, AMYLASE in the last 168 hours. No results for input(s): AMMONIA in the last 168 hours. CBC: Recent Labs  Lab 06/16/17 0347 06/19/17 0855  WBC 7.0 13.1*  HGB 12.6* 13.8  HCT 39.4 43.2  MCV 75.3* 75.8*  PLT 333 276   Cardiac Enzymes: Recent Labs  Lab 06/19/17 0855  TROPONINI <0.03   BNP: BNP (last 3 results) Recent Labs    11/19/16 2030  BNP 26.3    ProBNP (last 3 results) No results for input(s): PROBNP in the last 8760 hours.  CBG: No results for input(s): GLUCAP in the last 168 hours.     Signed:  Darlin Drop, MD [facility] 06/20/2017, 1:58 PM

## 2017-06-20 NOTE — Clinical Social Work Placement (Signed)
Pt discharged with plan to admit to [facility]. Report# [phone] DC information provided to facility via the HUB. Discussed with pt's sister-in-law Jasmine DecemberSharon via phone- family aware and agreeable to plan. Pt getting in touch with a friend who can retrieve his belongings from [facility] and bring them to him tomorrow. Arranged ptar transportation. Spent some time discussing pt's emotions surrounding move to SNF. He reports "I'm feeling a little anxious, this is lot to handle." Pt reports he feels generally positive as "If I wasn't going to try to rehab, I know I would be getting worse again. I can't tell you how much I appreciate what this hospital has done for me."  CLINICAL SOCIAL WORK PLACEMENT  NOTE  Date:  06/20/2017  Patient Details  Name: Matthew SpeedDana Stokes MRN: [id number] Date of Birth: 02-06-1956  Clinical Social Work is seeking post-discharge placement for this patient at the Skilled  Nursing Facility level of care (*CSW will initial, date and re-position this form in  chart as items are completed):  Yes   Patient/family provided with [place] Clinical Social Work Department's list of facilities offering this level of care within the geographic area requested by the patient (or if unable, by the patient's family).  Yes   Patient/family informed of their freedom to choose among providers that offer the needed level of care, that participate in Medicare, Medicaid or managed care program needed by the patient, have an available bed and are willing to accept the patient.  Yes   Patient/family informed of [facility]'s ownership interest in [facility] and [facility], as well as of the fact that they are under no obligation to receive care at these facilities.  PASRR submitted to EDS on 06/17/17     PASRR number received on 06/17/17     Existing PASRR number confirmed on       FL2 transmitted to all facilities in geographic area requested by pt/family on  06/19/17     FL2 transmitted to all facilities within larger geographic area on       Patient informed that his/her managed care company has contracts with or will negotiate with certain facilities, including the following:        Yes   Patient/family informed of bed offers received.  Patient chooses bed at ([facility])     Physician recommends and patient chooses bed at ([facility])    Patient to be transferred to ([facility]) on 06/20/17.  Patient to be transferred to facility by [facility]     Patient family notified on 06/20/17 of transfer.  Name of family member notified:  sister-in-law Jasmine DecemberSharon     PHYSICIAN       Additional Comment:    _______________________________________________ Nelwyn SalisburyMeghan R Kiersten Coss, LCSW 06/20/2017, 3:56 PM [phone]

## 2017-06-20 NOTE — Progress Notes (Signed)
PT demonstrated hands on understanding of Flutter device- NPC at this time. 

## 2017-06-20 NOTE — Progress Notes (Signed)
Tried to give report to [facility],phone kept on ringing, nobody picked up the phone.

## 2017-06-29 NOTE — Congregational Nurse Program (Signed)
Congregational Nurse Program Note  Date of Encounter: 06/06/2017  Past Medical History: Past Medical History:  Diagnosis Date  . Asthma   . Emphysema lung (HCC)   . Hypertension     Encounter Details: CNP Questionnaire - 06/06/17 0830      Questionnaire   Patient Status  Not Applicable    Race  Black or African American    [facility]    Insurance  Medicaid    Uninsured  Not Applicable    Food  Within past 12 months, worried food would run out with no money to buy more    Housing/Utilities  No permanent housing    Transportation  No transportation needs    Interpersonal Safety  No, do not feel physically and emotionally safe where you currently live    Medication  Yes, have medication insecurities    Medical Provider  Yes    Referrals  Primary Care Provider/Clinic    ED Visit Averted  Not Applicable    Life-Saving Intervention Made  Not Applicable      Client states his back is hurting worse than usual. He is also out of nebulizer medication. He will contact provider to have her [facility] those [facility].

## 2017-08-07 ENCOUNTER — Ambulatory Visit: Payer: Medicaid Other | Admitting: Family Medicine

## 2018-03-16 ENCOUNTER — Ambulatory Visit: Payer: Medicaid Other | Admitting: Family Medicine

## 2018-04-07 ENCOUNTER — Other Ambulatory Visit
Admission: RE | Admit: 2018-04-07 | Discharge: 2018-04-07 | Disposition: A | Discharge status: Home / Self Care | Payer: Medicaid Other | Source: Ambulatory Visit | Attending: Surgery | Admitting: Surgery

## 2018-04-07 DIAGNOSIS — J449 Chronic obstructive pulmonary disease, unspecified: Secondary | ICD-10-CM | POA: Diagnosis not present

## 2018-04-07 LAB — CBC WITH DIFFERENTIAL/PLATELET
Abs Immature Granulocytes: 0.03 10*3/uL (ref 0.00–0.07)
BASOS ABS: 0 10*3/uL (ref 0.0–0.1)
Basophils Relative: 0 %
Eosinophils Absolute: 0.2 10*3/uL (ref 0.0–0.5)
Eosinophils Relative: 3 %
HEMATOCRIT: 38.7 % — AB (ref 39.0–52.0)
Hemoglobin: 11.8 g/dL — ABNORMAL LOW (ref 13.0–17.0)
IMMATURE GRANULOCYTES: 0 %
Lymphocytes Relative: 33 %
Lymphs Abs: 2.9 10*3/uL (ref 0.7–4.0)
MCH: 21.9 pg — ABNORMAL LOW (ref 26.0–34.0)
MCHC: 30.5 g/dL (ref 30.0–36.0)
MCV: 71.7 fL — ABNORMAL LOW (ref 80.0–100.0)
MONO ABS: 0.9 10*3/uL (ref 0.1–1.0)
Monocytes Relative: 10 %
Neutro Abs: 4.9 10*3/uL (ref 1.7–7.7)
Neutrophils Relative %: 54 %
PLATELETS: 254 10*3/uL (ref 150–400)
RBC: 5.4 MIL/uL (ref 4.22–5.81)
RDW: 16.1 % — ABNORMAL HIGH (ref 11.5–15.5)
WBC: 9 10*3/uL (ref 4.0–10.5)
nRBC: 0 % (ref 0.0–0.2)

## 2018-05-06 ENCOUNTER — Other Ambulatory Visit
Admission: RE | Admit: 2018-05-06 | Discharge: 2018-05-06 | Disposition: A | Discharge status: Home / Self Care | Payer: Medicaid Other | Source: Ambulatory Visit | Attending: Internal Medicine | Admitting: Internal Medicine

## 2018-05-06 DIAGNOSIS — E7800 Pure hypercholesterolemia, unspecified: Secondary | ICD-10-CM | POA: Insufficient documentation

## 2018-05-06 LAB — CBC WITH DIFFERENTIAL/PLATELET
Abs Immature Granulocytes: 0.06 10*3/uL (ref 0.00–0.07)
BASOS ABS: 0 10*3/uL (ref 0.0–0.1)
Basophils Relative: 0 %
EOS ABS: 0 10*3/uL (ref 0.0–0.5)
EOS PCT: 0 %
HCT: 36 % — ABNORMAL LOW (ref 39.0–52.0)
HEMOGLOBIN: 11 g/dL — AB (ref 13.0–17.0)
IMMATURE GRANULOCYTES: 1 %
LYMPHS ABS: 2.7 10*3/uL (ref 0.7–4.0)
LYMPHS PCT: 23 %
MCH: 21.7 pg — ABNORMAL LOW (ref 26.0–34.0)
MCHC: 30.6 g/dL (ref 30.0–36.0)
MCV: 71.1 fL — AB (ref 80.0–100.0)
MONOS PCT: 8 %
Monocytes Absolute: 0.9 10*3/uL (ref 0.1–1.0)
Neutro Abs: 7.8 10*3/uL — ABNORMAL HIGH (ref 1.7–7.7)
Neutrophils Relative %: 68 %
Platelets: 420 10*3/uL — ABNORMAL HIGH (ref 150–400)
RBC: 5.06 MIL/uL (ref 4.22–5.81)
RDW: 14.6 % (ref 11.5–15.5)
WBC: 11.5 10*3/uL — ABNORMAL HIGH (ref 4.0–10.5)
nRBC: 0 % (ref 0.0–0.2)

## 2018-05-06 LAB — COMPREHENSIVE METABOLIC PANEL
ALT: 12 U/L (ref 0–44)
ANION GAP: 12 (ref 5–15)
AST: 22 U/L (ref 15–41)
Albumin: 3.5 g/dL (ref 3.5–5.0)
Alkaline Phosphatase: 60 U/L (ref 38–126)
BUN: 17 mg/dL (ref 6–20)
CO2: 26 mmol/L (ref 22–32)
Calcium: 9.6 mg/dL (ref 8.9–10.3)
Chloride: 101 mmol/L (ref 98–111)
Creatinine, Ser: 0.74 mg/dL (ref 0.61–1.24)
GFR calc Af Amer: >60 mL/min (ref >60)
GFR calc non Af Amer: >60 mL/min (ref >60)
GLUCOSE: 104 mg/dL — AB (ref 70–99)
POTASSIUM: 4.2 mmol/L (ref 3.5–5.1)
SODIUM: 139 mmol/L (ref 135–145)
Total Bilirubin: 0.6 mg/dL (ref 0.3–1.2)
Total Protein: 7.2 g/dL (ref 6.5–8.1)

## 2018-06-17 IMAGING — DX DG CHEST 2V
2 series · 2 of 2 positions shown · non-contrast
Comparison: 08/03/2016

CLINICAL DATA: Shortness of breath tonight.  History of COPD.

EXAM:
CHEST  2 VIEW

[chest pa]
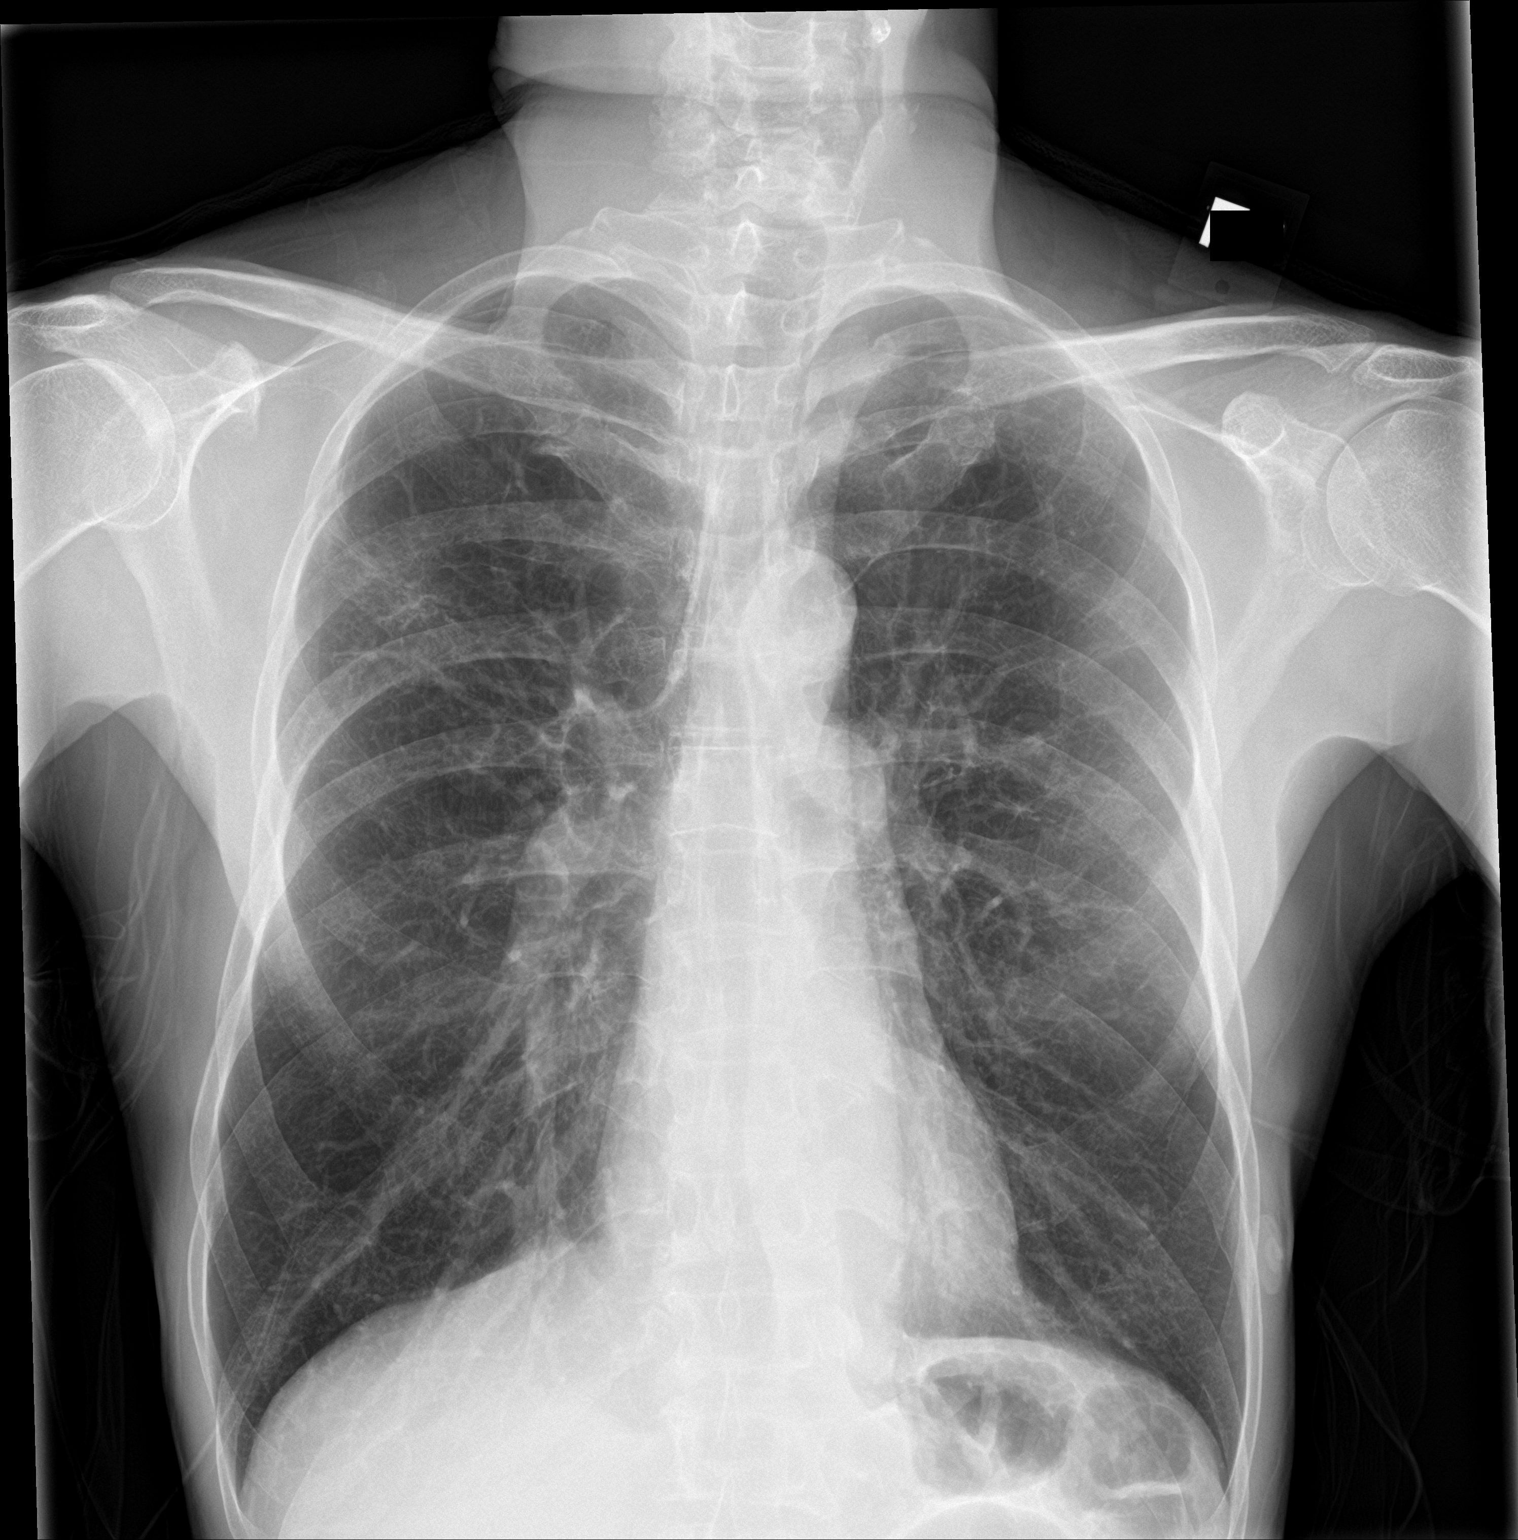

[chest lat]
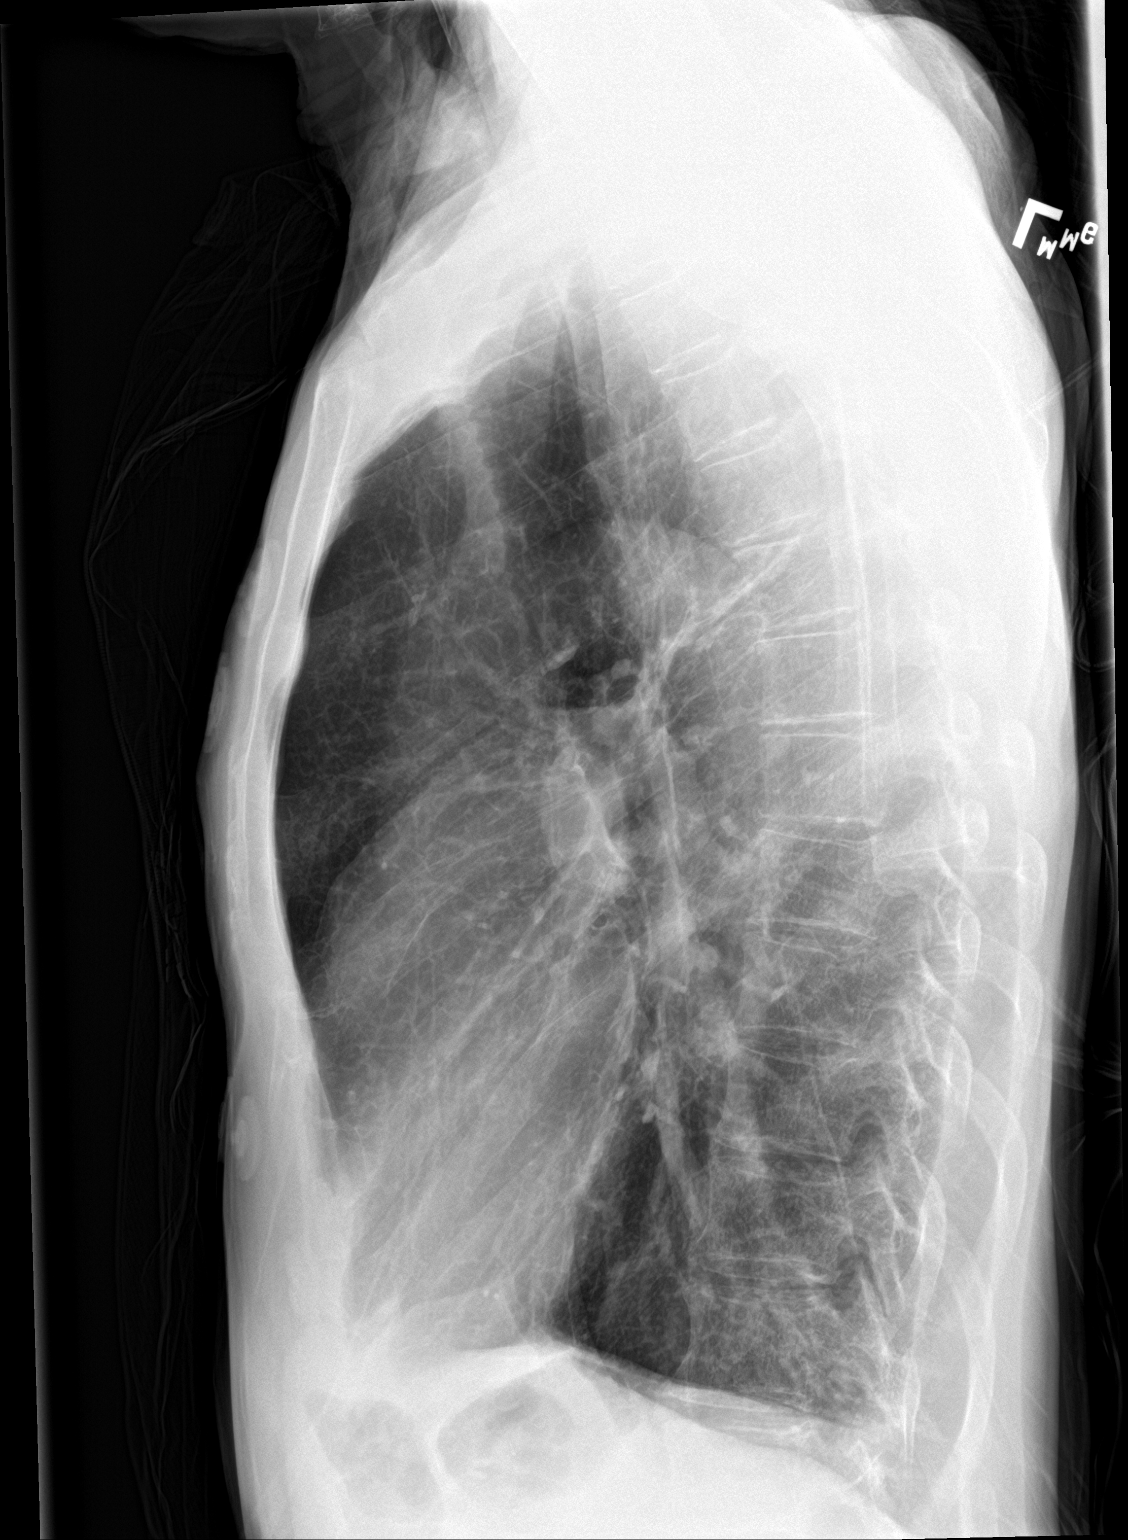

[2 of 2 positions shown; findings below may reference images not displayed]

FINDINGS: Shallow prominent emphysematous changes in the lungs. Scattered
fibrosis throughout the lungs but most prominent in the upper lung
region. Focal area of scarring again demonstrated in the right upper
lung which could represent early pulmonary nodule. Consider
follow-up CT for further evaluation of this lesion. No airspace
disease or consolidation. No blunting of costophrenic angles. No
pneumothorax. Heart size and pulmonary vascularity are normal.
IMPRESSION: Emphysematous changes and scattered fibrosis throughout the lungs.
Possible nodule or focal scarring in the right upper lung. No
airspace disease or consolidation.

## 2018-07-14 IMAGING — CR DG CHEST 2V
2 series · 2 of 2 positions shown · non-contrast
Comparison: Most recent comparison radiograph 09/03/2016, most
remote CT 08/03/2016

CLINICAL DATA: Increasing shortness of breath.

EXAM:
CHEST  2 VIEW

[w chest pa]
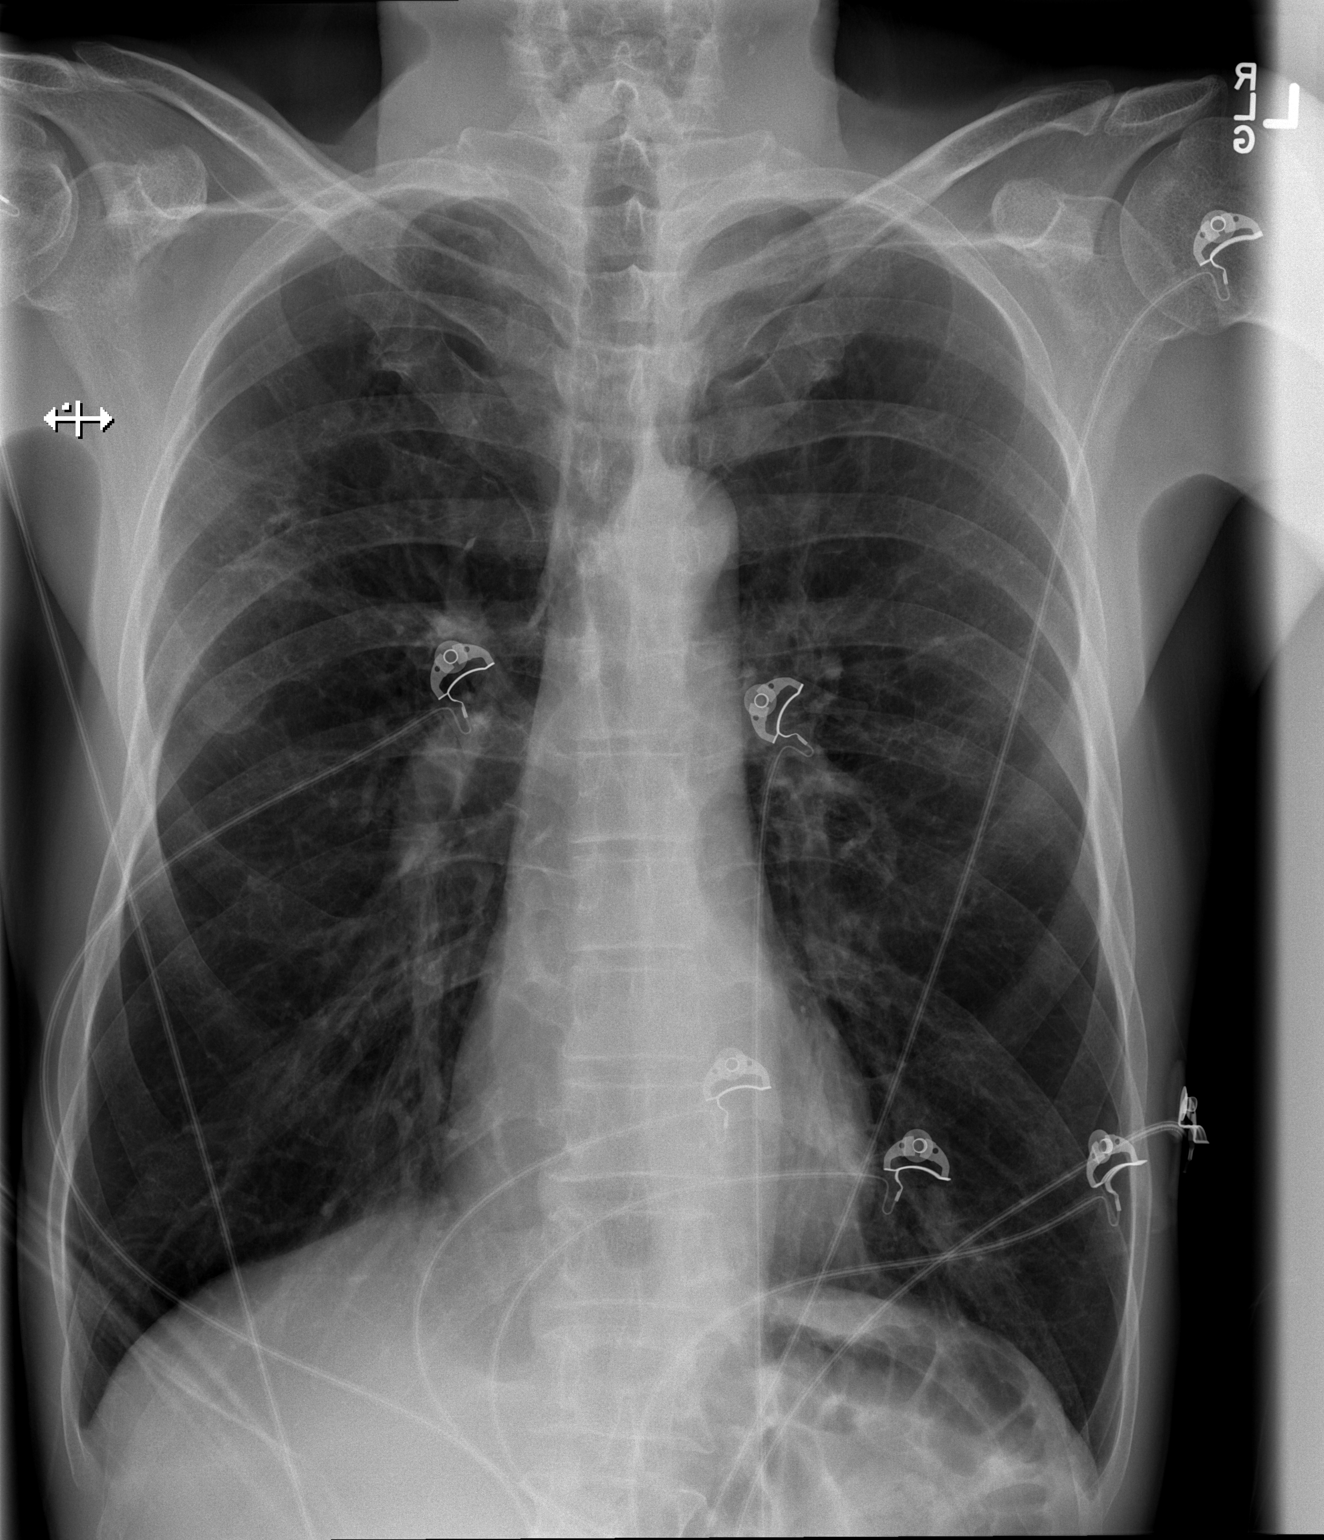

[w chest lat]
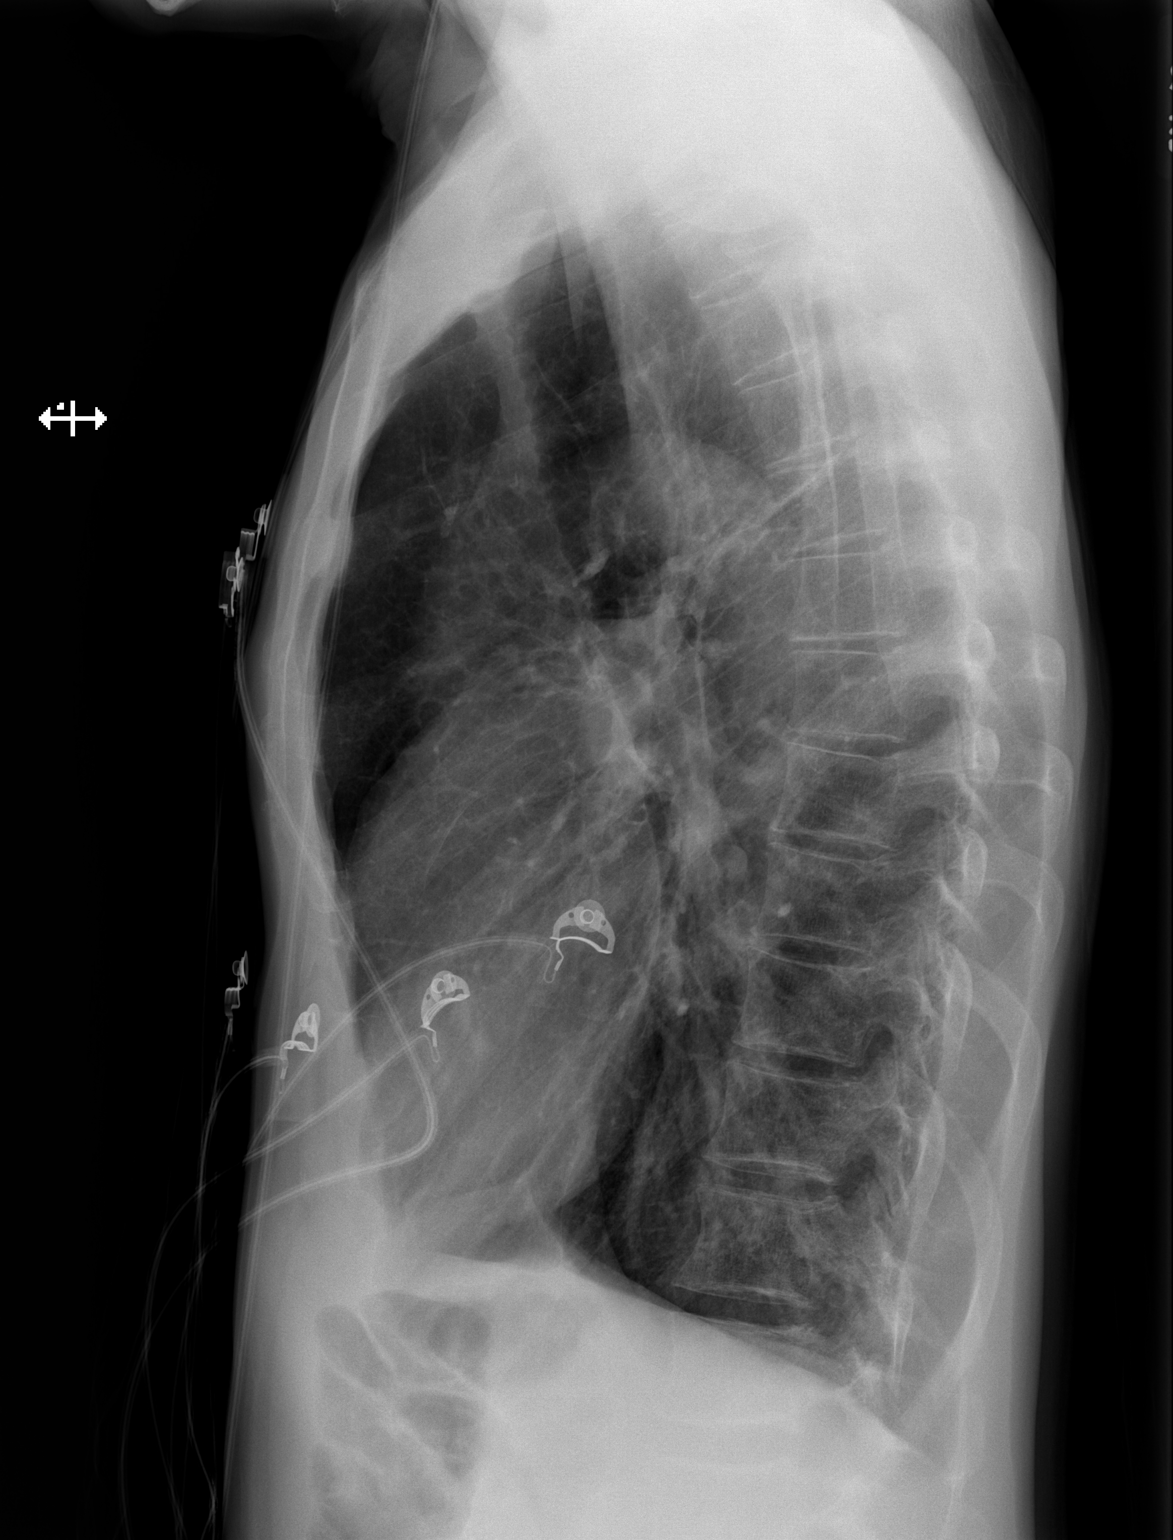

[2 of 2 positions shown; findings below may reference images not displayed]

FINDINGS: Again seen hyperinflation and emphysema. Irregular opacity in the
right upper lung is stable from prior exams. Normal heart size and
mediastinal contours. No pulmonary edema, focal airspace disease,
pleural fluid or pneumothorax. No acute osseous abnormalities.
IMPRESSION: 1. Chronic hyperinflation and emphysema consistent with COPD.
2. Probable right upper lobe scarring, stable from prior exams,
however incompletely characterized radiographically. In absence of
more remote comparisons, consider nonemergent chest CT
characterization to exclude presence of underline pulmonary nodule.

## 2018-07-31 NOTE — Telephone Encounter (Signed)
Message sent to provider 

## 2018-11-01 IMAGING — CR DG CHEST 2V
2 series · 2 of 2 positions shown · non-contrast
Comparison: 01/01/2017 chest radiograph.

CLINICAL DATA: Dyspnea

EXAM:
CHEST  2 VIEW

[w chest pa]
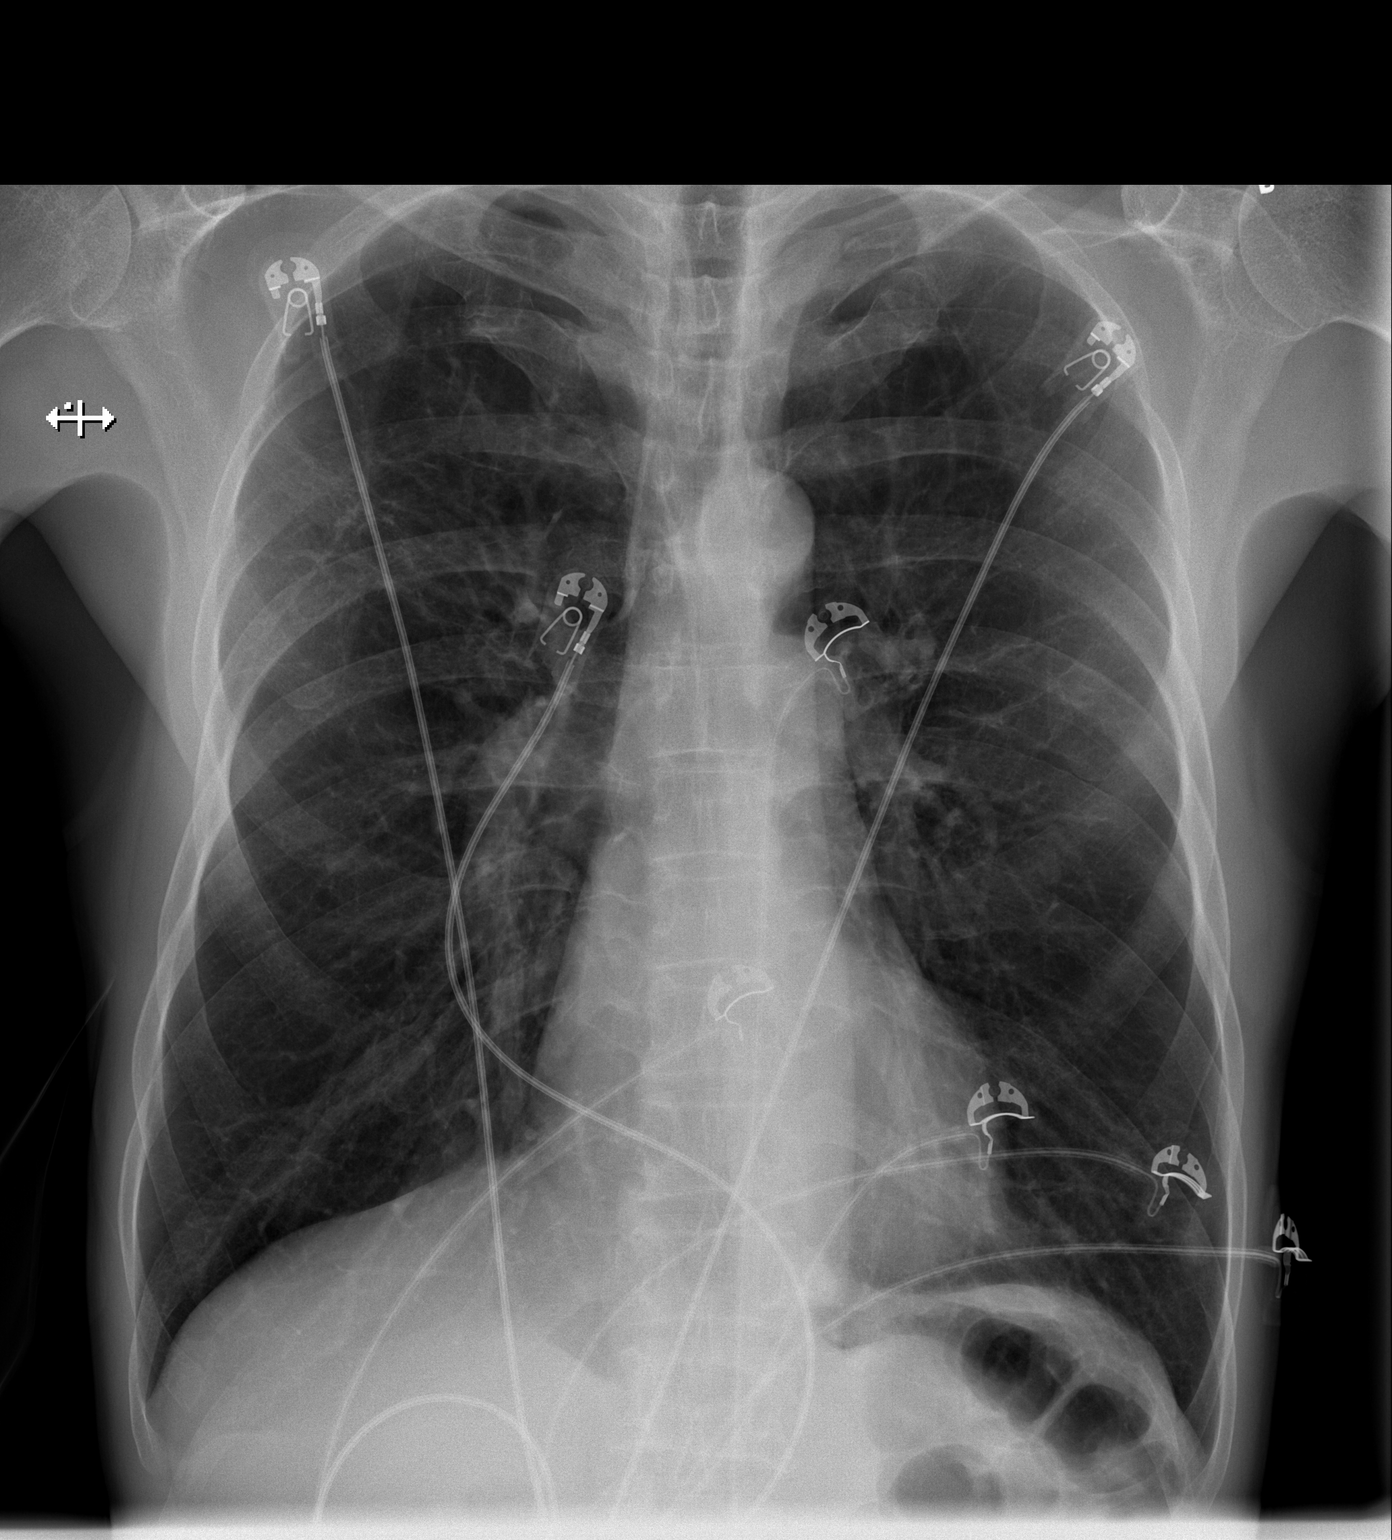

[w chest lat]
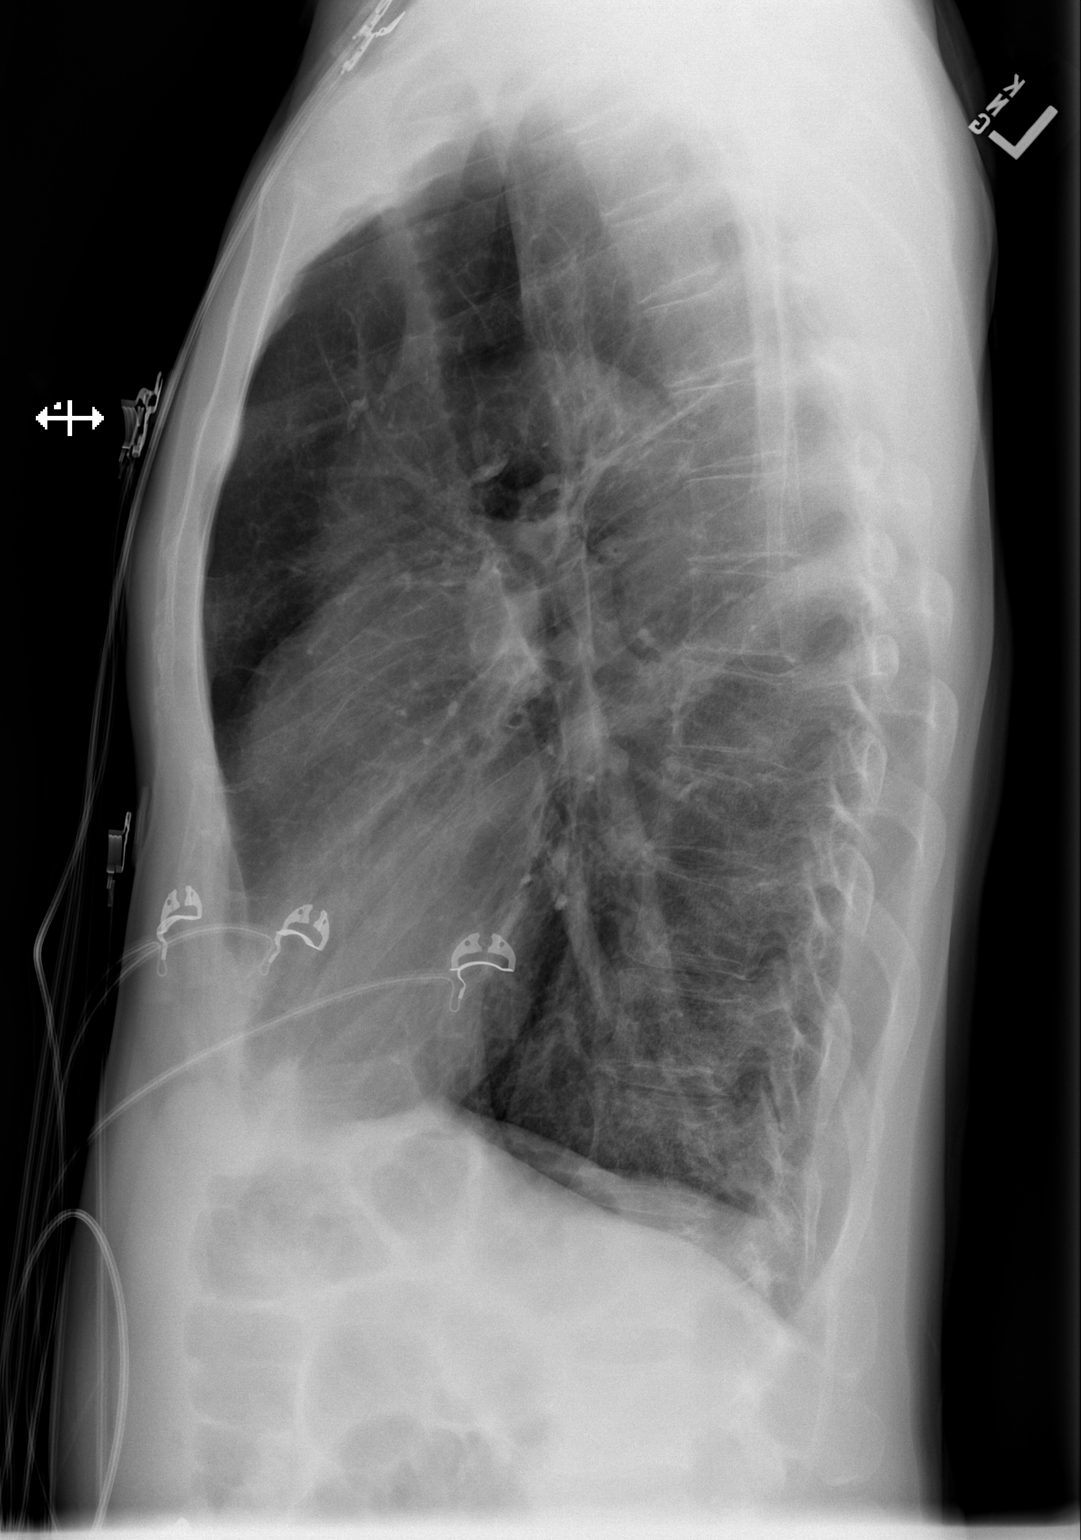

[2 of 2 positions shown; findings below may reference images not displayed]

FINDINGS: Stable cardiomediastinal silhouette with normal heart size. No
pneumothorax. No pleural effusion. Emphysema and hyperinflated
lungs. No pulmonary edema. No acute consolidative airspace disease.
IMPRESSION: 1. No acute cardiopulmonary disease.
2. Emphysema and hyperinflated lungs, compatible with COPD.

## 2022-11-03 DEATH — deceased
